# Patient Record
Sex: Male | Born: 1960
Health system: Southern US, Community
[De-identification: ages and names within clinical notes are randomized; demographics above are authoritative.]

## PROBLEM LIST (undated history)

## (undated) ENCOUNTER — Emergency Department (HOSPITAL_COMMUNITY): Admission: EM | Discharge status: Absent without leave | Payer: Medicaid Other

## (undated) DIAGNOSIS — K7460 Unspecified cirrhosis of liver: Secondary | ICD-10-CM

## (undated) DIAGNOSIS — R519 Headache, unspecified: Secondary | ICD-10-CM

## (undated) DIAGNOSIS — M12811 Other specific arthropathies, not elsewhere classified, right shoulder: Secondary | ICD-10-CM

## (undated) DIAGNOSIS — I10 Essential (primary) hypertension: Secondary | ICD-10-CM

## (undated) DIAGNOSIS — E119 Type 2 diabetes mellitus without complications: Secondary | ICD-10-CM

## (undated) DIAGNOSIS — R51 Headache: Secondary | ICD-10-CM

## (undated) DIAGNOSIS — D649 Anemia, unspecified: Secondary | ICD-10-CM

## (undated) HISTORY — DX: Essential (primary) hypertension: I10

## (undated) HISTORY — PX: SHOULDER ARTHROSCOPY W/ ROTATOR CUFF REPAIR: SHX2400

## (undated) HISTORY — DX: Anemia, unspecified: D64.9

## (undated) HISTORY — DX: Headache: R51

## (undated) HISTORY — DX: Other specific arthropathies, not elsewhere classified, right shoulder: M12.811

## (undated) HISTORY — DX: Headache, unspecified: R51.9

## (undated) HISTORY — DX: Unspecified cirrhosis of liver: K74.60

## (undated) HISTORY — DX: Type 2 diabetes mellitus without complications: E11.9

---

## 2003-11-01 ENCOUNTER — Emergency Department (HOSPITAL_COMMUNITY): Admission: AD | Admit: 2003-11-01 | Discharge: 2003-11-01 | Payer: Self-pay | Admitting: Family Medicine

## 2004-02-29 ENCOUNTER — Emergency Department (HOSPITAL_COMMUNITY): Admission: EM | Admit: 2004-02-29 | Discharge: 2004-02-29 | Payer: Self-pay | Admitting: *Deleted

## 2005-07-22 ENCOUNTER — Emergency Department (HOSPITAL_COMMUNITY): Admission: EM | Admit: 2005-07-22 | Discharge: 2005-07-22 | Payer: Self-pay | Admitting: Family Medicine

## 2007-07-24 ENCOUNTER — Emergency Department (HOSPITAL_COMMUNITY): Admission: EM | Admit: 2007-07-24 | Discharge: 2007-07-24 | Payer: Self-pay | Admitting: Emergency Medicine

## 2008-01-05 ENCOUNTER — Emergency Department (HOSPITAL_COMMUNITY): Admission: EM | Admit: 2008-01-05 | Discharge: 2008-01-05 | Payer: Self-pay | Admitting: Emergency Medicine

## 2008-01-25 ENCOUNTER — Emergency Department (HOSPITAL_COMMUNITY): Admission: EM | Admit: 2008-01-25 | Discharge: 2008-01-25 | Payer: Self-pay | Admitting: Emergency Medicine

## 2011-04-10 LAB — POCT URINALYSIS DIP (DEVICE)
Nitrite: NEGATIVE
Protein, ur: 100 — AB
Specific Gravity, Urine: 1.02
Urobilinogen, UA: 1

## 2011-04-18 LAB — POCT URINALYSIS DIP (DEVICE)
Bilirubin Urine: NEGATIVE
Ketones, ur: NEGATIVE
Ketones, ur: NEGATIVE
Nitrite: NEGATIVE
Protein, ur: 30 — AB
Specific Gravity, Urine: 1.015
Urobilinogen, UA: 1
Urobilinogen, UA: 0.2
pH: 6

## 2011-04-18 LAB — CBC
Platelets: 210
RBC: 6.04 — ABNORMAL HIGH
RDW: 14.8

## 2011-04-18 LAB — DIFFERENTIAL
Basophils Relative: 1
Eosinophils Relative: 7 — ABNORMAL HIGH
Lymphocytes Relative: 32
Monocytes Absolute: 0.4
Neutro Abs: 3.6
Neutrophils Relative %: 54

## 2011-04-18 LAB — POCT I-STAT, CHEM 8
Calcium, Ion: 1.24
HCT: 48
Hemoglobin: 16.3
TCO2: 28

## 2011-04-18 LAB — URINE CULTURE

## 2011-11-04 ENCOUNTER — Encounter (HOSPITAL_COMMUNITY): Payer: Self-pay | Admitting: Emergency Medicine

## 2011-11-04 ENCOUNTER — Emergency Department (INDEPENDENT_AMBULATORY_CARE_PROVIDER_SITE_OTHER)
Admission: EM | Admit: 2011-11-04 | Discharge: 2011-11-04 | Disposition: A | Discharge status: Home / Self Care | Payer: PRIVATE HEALTH INSURANCE | Source: Home / Self Care

## 2011-11-04 DIAGNOSIS — J069 Acute upper respiratory infection, unspecified: Secondary | ICD-10-CM

## 2011-11-04 MED ORDER — LORATADINE 10 MG PO TABS: 10.0000 mg | ORAL_TABLET | Freq: Every day | ORAL | Status: DC

## 2011-11-04 MED ORDER — BENZONATATE 100 MG PO CAPS: ORAL_CAPSULE | ORAL | Status: AC

## 2011-11-04 NOTE — Discharge Instructions (Signed)
Increase fluids and rest. Tylenol or Ibuprofen as needed for discomfort. Take medications as prescribed for nasal congestion and cough.  Return if symptoms change or worsen.   Upper Respiratory Infection, Adult An upper respiratory infection (URI) is also known as the common cold. It is often caused by a type of germ (virus). Colds are easily spread (contagious). You can pass it to others by kissing, coughing, sneezing, or drinking out of the same glass. Usually, you get better in 1 or 2 weeks.  HOME CARE   Only take medicine as told by your doctor.   Use a warm mist humidifier or breathe in steam from a hot shower.   Drink enough water and fluids to keep your pee (urine) clear or pale yellow.   Get plenty of rest.   Return to work when your temperature is back to normal or as told by your doctor. You may use a face mask and wash your hands to stop your cold from spreading.  GET HELP RIGHT AWAY IF:   After the first few days, you feel you are getting worse.   You have questions about your medicine.   You have chills, shortness of breath, or brown or red spit (mucus).   You have yellow or brown snot (nasal discharge) or pain in the face, especially when you bend forward.   You have a fever, puffy (swollen) neck, pain when you swallow, or white spots in the back of your throat.   You have a bad headache, ear pain, sinus pain, or chest pain.   You have a high-pitched whistling sound when you breathe in and out (wheezing).   You have a lasting cough or cough up blood.   You have sore muscles or a stiff neck.  MAKE SURE YOU:   Understand these instructions.   Will watch your condition.   Will get help right away if you are not doing well or get worse.  Document Released: 12/25/2007 Document Revised: 06/27/2011 Document Reviewed: 11/12/2010 [facility] Patient Information 2012 [place], [place].Upper Respiratory Infection, Adult An upper respiratory infection (URI) is also known as  the common cold. It is often caused by a type of germ (virus). Colds are easily spread (contagious). You can pass it to others by kissing, coughing, sneezing, or drinking out of the same glass. Usually, you get better in 1 or 2 weeks.  HOME CARE   Only take medicine as told by your doctor.   Use a warm mist humidifier or breathe in steam from a hot shower.   Drink enough water and fluids to keep your pee (urine) clear or pale yellow.   Get plenty of rest.   Return to work when your temperature is back to normal or as told by your doctor. You may use a face mask and wash your hands to stop your cold from spreading.  GET HELP RIGHT AWAY IF:   After the first few days, you feel you are getting worse.   You have questions about your medicine.   You have chills, shortness of breath, or brown or red spit (mucus).   You have yellow or brown snot (nasal discharge) or pain in the face, especially when you bend forward.   You have a fever, puffy (swollen) neck, pain when you swallow, or white spots in the back of your throat.   You have a bad headache, ear pain, sinus pain, or chest pain.   You have a high-pitched whistling sound when you breathe in  and out (wheezing).   You have a lasting cough or cough up blood.   You have sore muscles or a stiff neck.  MAKE SURE YOU:   Understand these instructions.   Will watch your condition.   Will get help right away if you are not doing well or get worse.  Document Released: December 12, 202009 Document Revised: 06/27/2011 Document Reviewed: 11/12/2010 [facility] Patient Information 2012 [place], [place].

## 2011-11-04 NOTE — ED Notes (Signed)
HERE WITH COLD SX THAT STARTED Saturday.COUGH WITH WHITE PHLEGM,CHILLS AND FEVER.TAKING TYLENOL.PT SPEAKS [place] LANGUAGE WITH LITTLE ENGLISH.ABLE TO EXPRESS NEEDS

## 2011-11-04 NOTE — ED Provider Notes (Signed)
History     CSN: [id number]  Arrival date & time 11/04/11  0848   None     Chief Complaint  Patient presents with  . URI    (Consider location/radiation/quality/duration/timing/severity/associated sxs/prior treatment) HPI Comments: Pt presents with complaints of nasal congestion and cough for one week. No dyspnea or wheezing.  He states he has had intermittent chills and HA, relieved with Tylenol. Nasal congestion and sputum is white in color. He denies hemoptysis. Pt denies any travel or sick contacts. He has been in the [place] for approximately 9 yrs.    History reviewed. No pertinent past medical history.  History reviewed. No pertinent past surgical history.  History reviewed. No pertinent family history.  History  Substance Use Topics  . Smoking status: Never Smoker   . Smokeless tobacco: Not on file  . Alcohol Use: No      Review of Systems  Constitutional: Negative for fever, chills and fatigue.  HENT: Positive for congestion and rhinorrhea. Negative for ear pain, sore throat, sneezing, postnasal drip and sinus pressure.   Respiratory: Positive for cough. Negative for shortness of breath and wheezing.   Cardiovascular: Negative for chest pain.    Allergies  Review of patient's allergies indicates no known allergies.  Home Medications   Current Outpatient Rx  Name Route Sig Dispense Refill  . BENZONATATE 100 MG PO CAPS  1-2 caps every 8 hrs prn cough 30 capsule 0  . LORATADINE 10 MG PO TABS Oral Take 1 tablet (10 mg total) by mouth daily. 10 tablet 0    BP 128/78  Pulse 62  Temp(Src) 98 F (36.7 C) (Oral)  Resp 14  SpO2 99%  Physical Exam  Nursing note and vitals reviewed. Constitutional: He appears well-developed and well-nourished. No distress.  HENT:  Head: Normocephalic and atraumatic.  Right Ear: Tympanic membrane, external ear and ear canal normal.  Left Ear: Tympanic membrane, external ear and ear canal normal.  Nose: Nose normal.    Mouth/Throat: Uvula is midline, oropharynx is clear and moist and mucous membranes are normal. No oropharyngeal exudate, posterior oropharyngeal edema or posterior oropharyngeal erythema.  Neck: Neck supple.  Cardiovascular: Normal rate, regular rhythm and normal heart sounds.   Pulmonary/Chest: Effort normal and breath sounds normal. No respiratory distress.  Lymphadenopathy:    He has no cervical adenopathy.  Neurological: He is alert.  Skin: Skin is warm and dry.  Psychiatric: He has a normal mood and affect.    ED Course  Procedures (including critical care time)  Labs Reviewed - No data to display No results found.   1. Acute URI       MDM  URI symptoms for one week. No documented fever, though reports intermittent chills. VSS, exam neg.         Melody Comas, [place] 11/04/11 1031

## 2011-11-07 NOTE — ED Provider Notes (Signed)
Medical screening examination/treatment/procedure(s) were performed by non-physician practitioner and as supervising physician I was immediately available for consultation/collaboration.  Alen Bleacher, MD 11/07/11 2125

## 2011-11-21 ENCOUNTER — Encounter (HOSPITAL_COMMUNITY): Payer: Self-pay | Admitting: Emergency Medicine

## 2011-11-21 ENCOUNTER — Emergency Department (HOSPITAL_COMMUNITY)
Admission: EM | Admit: 2011-11-21 | Discharge: 2011-11-21 | Disposition: A | Discharge status: Home / Self Care | Payer: PRIVATE HEALTH INSURANCE | Source: Home / Self Care | Attending: Emergency Medicine | Admitting: Emergency Medicine

## 2011-11-21 DIAGNOSIS — H8190 Unspecified disorder of vestibular function, unspecified ear: Secondary | ICD-10-CM

## 2011-11-21 LAB — POCT I-STAT, CHEM 8
BUN: 10 mg/dL (ref 6–23)
Calcium, Ion: 1.27 mmol/L (ref 1.12–1.32)
Chloride: 106 mEq/L (ref 96–112)
Hemoglobin: 16.3 g/dL (ref 13.0–17.0)
Potassium: 4.5 mEq/L (ref 3.5–5.1)
TCO2: 28 mmol/L (ref 0–100)

## 2011-11-21 MED ORDER — MECLIZINE HCL 12.5 MG PO TABS: 12.5000 mg | ORAL_TABLET | Freq: Three times a day (TID) | ORAL | Status: AC | PRN

## 2011-11-21 NOTE — ED Notes (Signed)
PT HERE WITH DAUGHTER THAT IS THE INTERPRETOR WITH [employer]/O [employer]UD[employer] V[employer]IG[employer] THAT STARTED THIS AM AFTER GETTING OOB FOR WORK.PT STATES THE ROOM WAS SPINNING,DIZZINESS AND BECAME NAUSEATED.SENSITIVE TO LIGHT.FRONTAL H/A NOTED.

## 2011-11-21 NOTE — ED Provider Notes (Signed)
History     CSN: [id number]  Arrival date & time 11/21/11  1478   First MD Initiated Contact with Patient 11/21/11 1040      Chief Complaint  Patient presents with  . Dizziness  . Migraine    (Consider location/radiation/quality/duration/timing/severity/associated sxs/prior treatment) HPI Comments: Patient described through any relative. He woke up this morning around 5 AM feeling very dizzy as he stood up felt like he was spinning he needed to sit down for a while and that seemed to have subsided a bit this sensation. He did say he became nauseous at one point. During interview I tried to establish clarity through his Niece if he had any type of headache, chest pains, palpitations, shortness of breath, tinnitus, ear pain or pressure or discomfort, or any other symptoms. Patient denies having felt any of his symptoms.  Patient is sitting in exam table and is asked if he is dizzy at this very moment. He replies no. 2 you have a headache ? No. Are you nauseous? No. Patient is then asked to move around and lay flat and he said that that seemed to trigger a little bit of spinning sensation but not as bad as this morning.  Patient is a 51 y.o. male presenting with migraine. The history is provided by the patient and a relative. No language interpreter was used.  Migraine This is a new problem. The current episode started 3 to 5 hours ago. The problem occurs constantly. The problem has not changed since onset.Pertinent negatives include no chest pain, no abdominal pain, no headaches and no shortness of breath. The symptoms are aggravated by bending and standing. The symptoms are relieved by rest.    History reviewed. No pertinent past medical history.  History reviewed. No pertinent past surgical history.  No family history on file.  History  Substance Use Topics  . Smoking status: Never Smoker   . Smokeless tobacco: Not on file  . Alcohol Use: No      Review of Systems    Constitutional: Negative for fever, chills, activity change and fatigue.  HENT: Negative for neck stiffness.   Respiratory: Negative for choking, chest tightness and shortness of breath.   Cardiovascular: Negative for chest pain.  Gastrointestinal: Negative for abdominal pain.  Skin: Negative for rash and wound.  Neurological: Positive for dizziness. Negative for tremors, seizures, syncope, facial asymmetry, speech difficulty, weakness, numbness and headaches.    Allergies  Review of patient's allergies indicates no known allergies.  Home Medications   Current Outpatient Rx  Name Route Sig Dispense Refill  . LORATADINE 10 MG PO TABS Oral Take 1 tablet (10 mg total) by mouth daily. 10 tablet 0  . MECLIZINE HCL 12.5 MG PO TABS Oral Take 1 tablet (12.5 mg total) by mouth 3 (three) times daily as needed for dizziness. 15 tablet 0    BP 117/71  Pulse 58  Temp(Src) 97.8 F (36.6 C) (Oral)  Resp 18  SpO2 98%  Physical Exam  Nursing note and vitals reviewed. Constitutional: He is oriented to person, place, and time. He appears well-developed and well-nourished.  HENT:  Head: Normocephalic.  Eyes: Conjunctivae and EOM are normal. Pupils are equal, round, and reactive to light. Right eye exhibits no discharge. Left eye exhibits no discharge.  Pulmonary/Chest: Effort normal and breath sounds normal. No respiratory distress. He has no decreased breath sounds. He has no wheezes. He has no rales. He exhibits no tenderness.  Abdominal: He exhibits no distension.  Musculoskeletal:  Normal range of motion.  Neurological: He is alert and oriented to person, place, and time. He displays no atrophy and normal reflexes. No cranial nerve deficit or sensory deficit. He exhibits normal muscle tone. He displays a negative Romberg sign. Coordination and gait normal.  Skin: No rash noted.    ED Course  Procedures (including critical care time)  Labs Reviewed  POCT I-STAT, CHEM 8 - Abnormal; Notable  for the following:    Glucose, Bld 121 (*)    All other components within normal limits   No results found.   1. Vertiginous syndrome       MDM  Patient with sudden onset of vertigo this morning was unable to go to work. Niece acted as Equities trader. Patient denies any headache, paresthesias or weakness. This episode has resolved for the most put describes that when he moves a little bit or stands up suddenly things spinning. Patient has a normal neurological exam was unable to reproduce his vertigo at this point. You had normal electrolytes. Haven't urged patient to be alert for specific symptoms weren't further evaluation members apartment. Meclizine prescription was given urged to rest for the next 48 hours and hydrate himself well. If symptoms were to persist or 48 hours of ice and to go to the emergency department for further evaluation.       Jimmie Molly, MD 11/21/11 1419

## 2011-11-21 NOTE — Discharge Instructions (Signed)
As discussed today's exam and current symptoms were consistent with a peripheral vertigo condition. We discuss in detail what other symptoms will warrant further evaluation in the emergency department. At this point avoid driving and rest for the next 48 hours take abundant fluids and take his prescribed medicine. If any new symptoms such as severe headache, numbness or tingling sensations of any of your upper lower extremities, any facial paralysis or changes or speech problems or worsening vertigo should go to the emergency department for further evaluation

## 2013-08-30 ENCOUNTER — Emergency Department (HOSPITAL_COMMUNITY)
Admission: EM | Admit: 2013-08-30 | Discharge: 2013-08-30 | Disposition: A | Discharge status: Home / Self Care | Payer: BC Managed Care – PPO | Source: Home / Self Care

## 2013-08-30 ENCOUNTER — Encounter (HOSPITAL_COMMUNITY): Payer: Self-pay | Admitting: Emergency Medicine

## 2013-08-30 DIAGNOSIS — S139XXA Sprain of joints and ligaments of unspecified parts of neck, initial encounter: Secondary | ICD-10-CM

## 2013-08-30 DIAGNOSIS — J069 Acute upper respiratory infection, unspecified: Secondary | ICD-10-CM

## 2013-08-30 DIAGNOSIS — S161XXA Strain of muscle, fascia and tendon at neck level, initial encounter: Secondary | ICD-10-CM

## 2013-08-30 MED ORDER — ONDANSETRON HCL 4 MG PO TABS: 4.0000 mg | ORAL_TABLET | Freq: Four times a day (QID) | ORAL | Status: DC

## 2013-08-30 MED ORDER — GUAIFENESIN-DM 100-10 MG/5ML PO SYRP: 5.0000 mL | ORAL_SOLUTION | ORAL | Status: DC | PRN

## 2013-08-30 MED ORDER — PHENYLEPH-DOXYL-DM-ASPIRIN 7.8-6.25-10-500 MG PO TBEF: EFFERVESCENT_TABLET | ORAL | Status: DC

## 2013-08-30 NOTE — ED Notes (Addendum)
C/o cough, headache and neck burning ( hot) onset 2 weeks ago.  C/o chills and fever.  C/o aching in neck and chest.

## 2013-08-30 NOTE — ED Notes (Signed)
Pt. said he has other older children that can translate the d/c instructions for him.   No questions.

## 2013-08-30 NOTE — ED Provider Notes (Signed)
CSN: [id number]     Arrival date & time 08/30/13  1538 History   First MD Initiated Contact with Patient 08/30/13 1705     No chief complaint on file.    (Consider location/radiation/quality/duration/timing/severity/associated sxs/prior Treatment) HPI Comments: 53 year old yet and needs male speaks a language The NAD presents with complaints of headache, cough, PND, stuffy nose, nasal congestion, sniffles and soreness in the neck returns his head. He also has abdominal wall pain upon coughing only.   No past medical history on file. No past surgical history on file. No family history on file. History  Substance Use Topics  . Smoking status: Never Smoker   . Smokeless tobacco: Not on file  . Alcohol Use: No    Review of Systems  Constitutional: Negative.   HENT:       As per history of present illness  Respiratory: Positive for cough. Negative for shortness of breath and wheezing.   Cardiovascular: Negative.   Gastrointestinal: Positive for nausea. Negative for vomiting.  Genitourinary: Negative.   Musculoskeletal: Positive for neck pain.  Neurological: Negative.       Allergies  Review of patient's allergies indicates no known allergies.  Home Medications   Current Outpatient Rx  Name  Route  Sig  Dispense  Refill  . guaiFENesin-dextromethorphan (ROBITUSSIN DM) 100-10 MG/5ML syrup   Oral   Take 5 mLs by mouth every 4 (four) hours as needed for cough.   118 mL   0   . ondansetron (ZOFRAN) 4 MG tablet   Oral   Take 1 tablet (4 mg total) by mouth every 6 (six) hours.   12 tablet   0   . Phenyleph-Doxyl-DM-Aspirin (ALKA-SELTZER PLUS NIGHT COLD) 7.8-6.25-10-500 MG TBEF      5 ml po q 4-6 h prn cough and cold symptoms   24 tablet   0    BP 123/88  Pulse 83  Temp(Src) 98.6 F (37 C) (Oral)  Resp 16  SpO2 100% Physical Exam  Nursing note and vitals reviewed. Constitutional: He is oriented to person, place, and time. He appears well-developed and  well-nourished. No distress.  HENT:  Mouth/Throat: No oropharyngeal exudate.  Bilateral TMs are normal Oropharynx with Minor erythema and clear PND  Eyes: Conjunctivae and EOM are normal. Pupils are equal, round, and reactive to light.  Neck: Normal range of motion. Neck supple.  Cardiovascular: Normal rate, regular rhythm and normal heart sounds.   Pulmonary/Chest: Effort normal and breath sounds normal. No respiratory distress. He has no wheezes. He has no rales.  Abdominal: Soft. He exhibits no distension.  Musculoskeletal: Normal range of motion. He exhibits no edema.  Lymphadenopathy:    He has no cervical adenopathy.  Neurological: He is alert and oriented to person, place, and time.  Skin: Skin is warm and dry. No rash noted.  Psychiatric: He has a normal mood and affect.    ED Course  Procedures (including critical care time) Labs Review Labs Reviewed - No data to display Imaging Review No results found.    MDM   Final diagnoses:  URI (upper respiratory infection)  Strain of neck muscle    Heat to the area of muscle that is sore around the neck. Take the Alka-Seltzer cold nighttime medicine as directed Robitussin-DM as needed for cough Zofran 4 mg every 6 hours when necessary nausea Plan fluids stay well-hydrated Call the above number to obtain a primary care physician.+    Janne Napoleon, NP 08/30/13 1725

## 2013-09-01 NOTE — ED Provider Notes (Signed)
Medical screening examination/treatment/procedure(s) were performed by resident physician or non-physician practitioner and as supervising physician I was immediately available for consultation/collaboration.   Pauline Good MD.   Billy Fischer, MD 09/01/13 [phone]

## 2016-04-10 ENCOUNTER — Ambulatory Visit (HOSPITAL_COMMUNITY)
Admission: EM | Admit: 2016-04-10 | Discharge: 2016-04-10 | Disposition: A | Discharge status: Home / Self Care | Payer: No Typology Code available for payment source | Attending: Family Medicine | Admitting: Family Medicine

## 2016-04-10 ENCOUNTER — Encounter (HOSPITAL_COMMUNITY): Payer: Self-pay | Admitting: Emergency Medicine

## 2016-04-10 DIAGNOSIS — G44219 Episodic tension-type headache, not intractable: Secondary | ICD-10-CM

## 2016-04-10 MED ORDER — KETOROLAC TROMETHAMINE 60 MG/2ML IM SOLN
60.0000 mg | Freq: Once | INTRAMUSCULAR | Status: AC
  Administered 2016-04-10: 60 mg via INTRAMUSCULAR

## 2016-04-10 MED ORDER — KETOROLAC TROMETHAMINE 10 MG PO TABS: 10.0000 mg | ORAL_TABLET | Freq: Four times a day (QID) | ORAL | 0 refills | Status: AC | PRN

## 2016-04-10 MED ORDER — KETOROLAC TROMETHAMINE 60 MG/2ML IM SOLN
INTRAMUSCULAR | Status: AC
  Filled 2016-04-10: qty 2

## 2016-04-10 NOTE — ED Triage Notes (Signed)
[place] interpreter [phone]  Pt reports a headache for the last three weeks.  He states when he eats meat and drinks soda he gets a headache, then he takes his BP and it is high. He does not give a numerical value.  He denies CP, SOB, or back pain.    He reports that when he gets the headache it goes down his neck and it becomes stiff.  He takes Tylenol and the pain subsides some but then it comes back.

## 2016-04-10 NOTE — ED Provider Notes (Signed)
CSN: [id number]     Arrival date & time 04/10/16  1511 History   First MD Initiated Contact with Patient 04/10/16 1556     Chief Complaint  Patient presents with  . Headache   (Consider location/radiation/quality/duration/timing/severity/associated sxs/prior Treatment) The history is provided by the patient. The history is limited by a language barrier. A language interpreter was used.  Headache  Pain location:  Generalized Quality:  Unable to specify Radiates to:  L neck, R neck, L shoulder and R shoulder Severity currently:  7/10 Onset quality:  Gradual Duration:  3 weeks Timing:  Intermittent Progression:  Worsening Chronicity:  New Similar to prior headaches: yes   Relieved by:  Nothing (Tried tylenol and goody powder) Ineffective treatments:  Acetaminophen Associated symptoms: neck pain   Associated symptoms: no abdominal pain, no back pain, no blurred vision, no congestion, no cough, no diarrhea, no dizziness, no drainage, no ear pain, no eye pain, no facial pain, no fatigue, no fever, no focal weakness, no hearing loss, no loss of balance, no myalgias, no nausea, no near-syncope, no neck stiffness, no numbness, no paresthesias, no photophobia, no sinus pressure, no sore throat, no syncope and no visual change     History reviewed. No pertinent past medical history. History reviewed. No pertinent surgical history. History reviewed. No pertinent family history. Social History  Substance Use Topics  . Smoking status: Never Smoker  . Smokeless tobacco: Never Used  . Alcohol use No    Review of Systems  Constitutional: Negative for appetite change, chills, fatigue and fever.  HENT: Negative for congestion, ear pain, hearing loss, postnasal drip, sinus pressure and sore throat.   Eyes: Negative for blurred vision, photophobia and pain.  Respiratory: Negative for cough and shortness of breath.   Cardiovascular: Negative for chest pain, palpitations, leg swelling, syncope and  near-syncope.  Gastrointestinal: Negative for abdominal pain, diarrhea and nausea.  Musculoskeletal: Positive for neck pain. Negative for back pain, myalgias and neck stiffness.  Neurological: Positive for headaches. Negative for dizziness, focal weakness, numbness, paresthesias and loss of balance.    Allergies  Review of patient's allergies indicates no known allergies.  Home Medications   Prior to Admission medications   Medication Sig Start Date End Date Taking? Authorizing Provider  acetaminophen (TYLENOL) 325 MG tablet Take 650 mg by mouth every 6 (six) hours as needed for headache.   Yes Historical Provider, MD  guaiFENesin-dextromethorphan (ROBITUSSIN DM) 100-10 MG/5ML syrup Take 5 mLs by mouth every 4 (four) hours as needed for cough. 08/30/13   Janne Napoleon, NP  ketorolac (TORADOL) 10 MG tablet Take 1 tablet (10 mg total) by mouth every 6 (six) hours as needed. 04/10/16 04/13/16  Barry Dienes, NP  ondansetron (ZOFRAN) 4 MG tablet Take 1 tablet (4 mg total) by mouth every 6 (six) hours. 08/30/13   Janne Napoleon, NP  Phenyleph-Doxyl-DM-Aspirin (ALKA-SELTZER PLUS NIGHT COLD) 7.8-6.25-10-500 MG TBEF 5 ml po q 4-6 h prn cough and cold symptoms 08/30/13   Janne Napoleon, NP  Pseudoeph-Doxylamine-DM-APAP (NYQUIL PO) Take by mouth.    Historical Provider, MD   Meds Ordered and Administered this Visit   Medications  ketorolac (TORADOL) injection 60 mg (60 mg Intramuscular Given 04/10/16 1700)    BP 126/86 (BP Location: Right Leg)   Pulse 75   Temp 98.4 F (36.9 C) (Oral)   SpO2 96%  No data found.   Physical Exam  Constitutional: He is oriented to person, place, and time. He appears well-developed and well-nourished.  HENT:  Head: Normocephalic and atraumatic.  Right Ear: External ear normal.  Left Ear: External ear normal.  Nose: Nose normal.  Mouth/Throat: Oropharynx is clear and moist. No oropharyngeal exudate.  Eyes: Conjunctivae and EOM are normal. Pupils are equal, round, and reactive to  light. Right eye exhibits no discharge. Left eye exhibits no discharge.  Neck: Normal range of motion. Neck supple.  Sore to palpate at neck and shoulders areas.   Cardiovascular: Normal rate, regular rhythm and normal heart sounds.   Pulmonary/Chest: Effort normal and breath sounds normal.  Abdominal: Soft. Bowel sounds are normal. He exhibits no distension. There is no tenderness.  Musculoskeletal: Normal range of motion.  Lymphadenopathy:    He has no cervical adenopathy.  Neurological: He is alert and oriented to person, place, and time.  Cranial nerves intact. Has good coordination.    Skin: Skin is warm and dry.  Nursing note and vitals reviewed.   Urgent Care Course   Clinical Course    Procedures (including critical care time)  Labs Review Labs Reviewed - No data to display  Imaging Review No results found.   MDM   1. Episodic tension-type headache, not intractable    Physical examination unremarkable. Patient was afraid that his HA was due to high blood pressure, however his BP is at normal range in the office. He denies hx of HTN. He denies checking BP at home in the last 3 weeks when he has these headache episodes. Headache characteristic inconsistent with migraine or cluster, more consistent with tension-type. Patient treated with ketorolac 60mg  IM and headache has improved, headache is now 3/10. Patient would like a prescription to take home with him. Rx given for Ketorolac 10mg  Every 6 hours as needed. Discharge instruction given. ER Return precaution discussed.    Barry Dienes, NP 04/10/16 1729

## 2016-09-04 ENCOUNTER — Encounter: Payer: Self-pay | Admitting: Adult Health

## 2016-09-04 ENCOUNTER — Ambulatory Visit (INDEPENDENT_AMBULATORY_CARE_PROVIDER_SITE_OTHER): Payer: No Typology Code available for payment source | Admitting: Adult Health

## 2016-09-04 VITALS — Temp 98.6°F | Wt 140.2 lb

## 2016-09-04 DIAGNOSIS — Z01818 Encounter for other preprocedural examination: Secondary | ICD-10-CM | POA: Diagnosis not present

## 2016-09-04 DIAGNOSIS — Z23 Encounter for immunization: Secondary | ICD-10-CM | POA: Diagnosis not present

## 2016-09-04 DIAGNOSIS — I10 Essential (primary) hypertension: Secondary | ICD-10-CM

## 2016-09-04 DIAGNOSIS — Z7689 Persons encountering health services in other specified circumstances: Secondary | ICD-10-CM | POA: Diagnosis not present

## 2016-09-04 LAB — BASIC METABOLIC PANEL
BUN: 9 mg/dL (ref 6–23)
CALCIUM: 9.5 mg/dL (ref 8.4–10.5)
CO2: 32 mEq/L (ref 19–32)
CREATININE: 0.8 mg/dL (ref 0.40–1.50)
Chloride: 94 mEq/L — ABNORMAL LOW (ref 96–112)
GFR: 105.86 mL/min (ref >60.00)
Glucose, Bld: 360 mg/dL — ABNORMAL HIGH (ref 70–99)
Potassium: 3.7 mEq/L (ref 3.5–5.1)
SODIUM: 129 meq/L — AB (ref 135–145)

## 2016-09-04 LAB — CBC WITH DIFFERENTIAL/PLATELET
Basophils Absolute: 0.1 10*3/uL (ref 0.0–0.1)
Basophils Relative: 1 % (ref 0.0–3.0)
EOS PCT: 1.1 % (ref 0.0–5.0)
Eosinophils Absolute: 0.1 10*3/uL (ref 0.0–0.7)
HCT: 42.1 % (ref 39.0–52.0)
Hemoglobin: 13.4 g/dL (ref 13.0–17.0)
LYMPHS ABS: 1.9 10*3/uL (ref 0.7–4.0)
Lymphocytes Relative: 29.7 % (ref 12.0–46.0)
MCHC: 31.8 g/dL (ref 30.0–36.0)
MCV: 73 fl — AB (ref 78.0–100.0)
MONO ABS: 0.4 10*3/uL (ref 0.1–1.0)
MONOS PCT: 5.8 % (ref 3.0–12.0)
NEUTROS ABS: 4.1 10*3/uL (ref 1.4–7.7)
NEUTROS PCT: 62.4 % (ref 43.0–77.0)
PLATELETS: 163 10*3/uL (ref 150.0–400.0)
RBC: 5.77 Mil/uL (ref 4.22–5.81)
RDW: 14.4 % (ref 11.5–15.5)
WBC: 6.5 10*3/uL (ref 4.0–10.5)

## 2016-09-04 NOTE — Addendum Note (Signed)
Addended by: Wynn Banker H on: 09/04/2016 11:14 AM   Modules accepted: Orders

## 2016-09-04 NOTE — Patient Instructions (Addendum)
It was great meeting you today   After your surgery, I would like you to follow up with me for your full physical exam.   Please bring all your prescription bottles with you to the next appointment.

## 2016-09-04 NOTE — Addendum Note (Signed)
Addended by: Sandria Bales B on: 09/04/2016 02:30 PM   Modules accepted: Orders

## 2016-09-04 NOTE — Progress Notes (Signed)
Patient presents to clinic today to establish care. He is a pleasant 56 year old viet[place] male who  has a past medical history of Essential hypertension and Frequent headaches. His daughter is with him to help provide translation  BP Readings from Last 3 Encounters:  04/10/16 126/86  08/30/13 123/88  11/21/11 117/71    Acute Concerns: Establish Care   Surgery Clearance  - he is having right shoulder scope and needs to have pre surgical clearance   Chronic Issues: Migraines - He uses OTC medications when he has a headache and this works well for him. He gets migraines once or twice per month   Essential Hypertension- He takes medication for this but does not know what it is called. He does not have his prescription bottles with him.   Health Maintenance: Dental -- Does not do routine care  Vision -- Does not do routine care  Immunizations -- Needs to  Colonoscopy -- Never had and does not know if he wants one.    Past Medical History:  Diagnosis Date  . Essential hypertension   . Frequent headaches     No past surgical history on file.  Current Outpatient Prescriptions on File Prior to Visit  Medication Sig Dispense Refill  . [DISCONTINUED] loratadine (CLARITIN) 10 MG tablet Take 1 tablet (10 mg total) by mouth daily. 10 tablet 0   No current facility-administered medications on file prior to visit.     No Known Allergies  Family History  Problem Relation Age of Onset  . Family history unknown: Yes    Social History   Social History  . Marital status: Married    Spouse name: N/A  . Number of children: N/A  . Years of education: N/A   Occupational History  . Not on file.   Social History Main Topics  . Smoking status: Never Smoker  . Smokeless tobacco: Never Used  . Alcohol use No  . Drug use: No  . Sexual activity: Yes    Birth control/ protection: None   Other Topics Concern  . Not on file   Social History Narrative   Works for [employer]        Review of Systems  Constitutional: Negative.   HENT: Negative.   Eyes: Negative.   Respiratory: Negative.   Cardiovascular: Negative.   Gastrointestinal: Negative.   Musculoskeletal: Negative.   Skin: Negative.   Neurological: Negative.   Endo/Heme/Allergies: Negative.   Psychiatric/Behavioral: Negative.   All other systems reviewed and are negative.   Temp 98.6 F (37 C) (Oral)   Wt 140 lb 3.2 oz (63.6 kg)   Physical Exam  Constitutional: He is oriented to person, place, and time and well-developed, well-nourished, and in no distress. No distress.  HENT:  Head: Normocephalic and atraumatic.  Right Ear: External ear normal.  Left Ear: External ear normal.  Nose: Nose normal.  Mouth/Throat: Oropharynx is clear and moist. Abnormal dentition. No oropharyngeal exudate.  Eyes: Conjunctivae and EOM are normal. Pupils are equal, round, and reactive to light. Right eye exhibits no discharge. Left eye exhibits no discharge. No scleral icterus.  Neck: Normal range of motion. Neck supple. No JVD present. No tracheal deviation present. No thyromegaly present.  Cardiovascular: Normal rate, regular rhythm, normal heart sounds and intact distal pulses.  Exam reveals no gallop and no friction rub.   No murmur heard. Pulmonary/Chest: Effort normal and breath sounds normal. No stridor. No respiratory distress. He has no wheezes. He  has no rales. He exhibits no tenderness.  Abdominal: Soft. Bowel sounds are normal. He exhibits no distension and no mass. There is no tenderness. There is no rebound and no guarding.  Musculoskeletal: Normal range of motion. He exhibits no edema, tenderness or deformity.  Lymphadenopathy:    He has no cervical adenopathy.  Neurological: He is alert and oriented to person, place, and time. He displays normal reflexes. No cranial nerve deficit. He exhibits normal muscle tone. Coordination normal. GCS score is 15.  Skin: Skin is warm and dry. No rash  noted. He is not diaphoretic. No erythema. No pallor.  Psychiatric: Mood, memory, affect and judgment normal.  Nursing note and vitals reviewed.   Assessment/Plan: 1. Encounter to establish care - Follow up for CPE  - Follow up sooner if needed - Tdap given   2. Preop examination - EKG 12-Lead-  SR - Rate 68. Showed low voltage in limb leads. Anterolateral ST elevation. This appears to be from lead placement. Reviewed with Dr. Sherren Mocha and he agrees.  - CBC with Differential/Platelet - Basic metabolic panel; Future  3. Essential hypertension - Spoke with his local pharmacy and they advised that his only maintenance medication was for HCTZ 25 mg and his last refill was about 9 months ago for 30 days. His blood pressure is normal without this medication. I will not refill at this time.  - CBC with Differential/Platelet - Basic metabolic panel; Future  Dorothyann Peng, NP

## 2016-09-05 LAB — HEMOGLOBIN A1C: HEMOGLOBIN A1C: 11.9 % — AB (ref 4.6–6.5)

## 2016-09-10 ENCOUNTER — Ambulatory Visit: Payer: No Typology Code available for payment source | Admitting: Adult Health

## 2016-09-11 ENCOUNTER — Encounter: Payer: Self-pay | Admitting: Adult Health

## 2016-09-11 ENCOUNTER — Ambulatory Visit (INDEPENDENT_AMBULATORY_CARE_PROVIDER_SITE_OTHER): Payer: No Typology Code available for payment source | Admitting: Adult Health

## 2016-09-11 VITALS — BP 132/74 | Temp 98.3°F | Wt 142.2 lb

## 2016-09-11 DIAGNOSIS — E1165 Type 2 diabetes mellitus with hyperglycemia: Secondary | ICD-10-CM

## 2016-09-11 DIAGNOSIS — E119 Type 2 diabetes mellitus without complications: Secondary | ICD-10-CM | POA: Insufficient documentation

## 2016-09-11 DIAGNOSIS — IMO0001 Reserved for inherently not codable concepts without codable children: Secondary | ICD-10-CM

## 2016-09-11 MED ORDER — GLUCOSE BLOOD VI STRP: ORAL_STRIP | 12 refills | Status: AC

## 2016-09-11 MED ORDER — LISINOPRIL 2.5 MG PO TABS
2.5000 mg | ORAL_TABLET | Freq: Every day | ORAL | 1 refills | Status: DC
Start: 2016-09-11 — End: 2017-06-10

## 2016-09-11 MED ORDER — METFORMIN HCL 500 MG PO TABS
500.0000 mg | ORAL_TABLET | Freq: Two times a day (BID) | ORAL | 3 refills | Status: DC
Start: 2016-09-11 — End: 2016-10-15

## 2016-09-11 MED ORDER — ONETOUCH LANCETS MISC: 3 refills | Status: AC

## 2016-09-11 NOTE — Progress Notes (Signed)
Subjective:    Patient ID: Michael Hamilton, male    DOB: 1960-12-07, 56 y.o.   MRN: [id number]  HPI  56 year old [name] who  has a past medical history of Diabetes mellitus ([place]); Essential hypertension; and Frequent headaches. He presents to the office today with his daughter and [place] interpreter for follow-up on recent labs. I last saw him a week ago it was for presurgical clearance for probable torn rotator cuff on right side. Basic labs were given and a blood sugar of greater than 300 was noted, subsequently an A1c was added on,which came back as being 11.9.  Patient was made aware through [place] interpreter of his diagnosis of diabetes mellitus. He denies any family history or ever being diagnosed with diabetes in the past.  He denies any fatigue, polyuria, polydipsia, or feeling ill.   Review of Systems  Constitutional: Negative.   Respiratory: Negative.   Cardiovascular: Negative.   Gastrointestinal: Negative.   Genitourinary: Negative.   Musculoskeletal: Positive for arthralgias.  Skin: Negative.   Neurological: Negative.   All other systems reviewed and are negative.  Past Medical History:  Diagnosis Date  . Diabetes mellitus ([place])   . Essential hypertension   . Frequent headaches     Social History   Social History  . Marital status: Married    Spouse name: N/A  . Number of children: N/A  . Years of education: N/A   Occupational History  . Not on file.   Social History Main Topics  . Smoking status: Never Smoker  . Smokeless tobacco: Never Used  . Alcohol use No  . Drug use: No  . Sexual activity: Yes    Birth control/ protection: None   Other Topics Concern  . Not on file   Social History Narrative   Works for [employer]        No past surgical history on file.  Family History  Problem Relation Age of Onset  . Family history unknown: Yes    No Known Allergies  Current Outpatient Prescriptions on File Prior to Visit    Medication Sig Dispense Refill  . HYDROcodone-acetaminophen (NORCO/VICODIN) 5-325 MG tablet Take 1 tablet by mouth every 6 (six) hours as needed for moderate pain.    Marland Kitchen ibuprofen (ADVIL,MOTRIN) 600 MG tablet Take 600 mg by mouth every 6 (six) hours as needed.    Marland Kitchen ketorolac (TORADOL) 10 MG tablet Take 10 mg by mouth every 6 (six) hours as needed.    . methocarbamol (ROBAXIN) 500 MG tablet Take 500 mg by mouth every 6 (six) hours as needed for muscle spasms.    . traMADol (ULTRAM) 50 MG tablet Take 50 mg by mouth every 6 (six) hours as needed.    . [DISCONTINUED] loratadine (CLARITIN) 10 MG tablet Take 1 tablet (10 mg total) by mouth daily. 10 tablet 0   No current facility-administered medications on file prior to visit.     BP 132/74   Temp 98.3 F (36.8 C) (Oral)   Wt 142 lb 3.2 oz (64.5 kg)       Objective:   Physical Exam  Constitutional: He is oriented to person, place, and time. He appears well-developed and well-nourished. No distress.  Eyes: Conjunctivae and EOM are normal. Pupils are equal, round, and reactive to light. Right eye exhibits no discharge. Left eye exhibits no discharge. No scleral icterus.  Neck: Normal range of motion. Neck supple. No thyromegaly present.  Cardiovascular: Normal rate, regular rhythm, normal heart sounds  and intact distal pulses.  Exam reveals no gallop and no friction rub.   No murmur heard. Pulmonary/Chest: Effort normal and breath sounds normal. No respiratory distress. He has no wheezes. He has no rales. He exhibits no tenderness.  Musculoskeletal: Normal range of motion. He exhibits no edema, tenderness or deformity.  Lymphadenopathy:    He has no cervical adenopathy.  Neurological: He is alert and oriented to person, place, and time.  Skin: Skin is warm and dry. No rash noted. He is not diaphoretic. No erythema. No pallor.  Psychiatric: He has a normal mood and affect. His behavior is normal. Judgment and thought content normal.  Nursing  note and vitals reviewed.      Assessment & Plan:  1. Uncontrolled type 2 diabetes mellitus without complication, without long-term current use of insulin (HCC) - We spoke at length about his newly diagnosed diabetes, including blood glucose control, medications, diet, and exercise. All questions were answered - Also was given a glucometer and he and his daughter were shown how to properly use this machine. They were able to demonstrate back to this writer how to use glucometer. He was advised to check his blood sugars 3 times a day and bring log to either the next appointment with me or with endocrinology. - Due to being a newly diagnosed I feel that starting this issue on insulin right now would not be the best thing to do as I believe there might be some discord with proper adherence to insulin regimens. I will start him off on metformin 500 mg twice a day and titrate up -  Ambulatory referral to Endocrinology - lisinopril (ZESTRIL) 2.5 MG tablet; Take 1 tablet (2.5 mg total) by mouth daily.  Dispense: 90 tablet; Refill: 1 - metFORMIN (GLUCOPHAGE) 500 MG tablet; Take 1 tablet (500 mg total) by mouth 2 (two) times daily with a meal.  Dispense: 180 tablet; Refill: 3 - glucose blood (ONETOUCH VERIO) test strip; Test blood sugars as directed. Dx: E11.9  Dispense: 100 each; Refill: 12 - ONE TOUCH LANCETS MISC; Test blood sugars as directed. Dx: E11.9  Dispense: 100 each; Refill: 3 - Follow-up in one month or with endocrinology which ever comes first  Greater than 50% of 45 minutes was spent face-to-face with patient, counseling and/or coordinating care and due to proper diabetes management

## 2016-09-11 NOTE — Patient Instructions (Addendum)
Theo di ???ng huy?t, Ng??i l?n (Blood Glucose Monitoring, Adult) Theo di ???ng huy?t (cn ???c g?i l l??ng ???ng trong mu) s? gip qu v? qu?n l b?nh ti?u ???ng c?a mnh. N c?ng gip qu v? v chuyn gia ch?m [place] s?c kh?e c?a qu v? theo di b?nh ti?u ???ng v xc ??nh k? ho?ch ?i?u tr? c?a qu v? hi?u qu? ??n ?u. T?I SAO QU V? NN THEO DI ???NG HUY?T C?A MNH?   Vi?c theo di c th? gip qu v? hi?u r th?c ?n, t?p th? d?c v thu?c men ?nh h??ng nh? th? no ??n l??ng ???ng huy?t c?a qu v?.  N cho php qu v? bi?t l??ng ???ng huy?t t?i m?t th?i ?i?m nh?t ??nh. Qu v? c th? nhanh chng bi?t ???c n?u qu v? ?ang c ???ng huy?t th?p (h? ???ng huy?t) ho?c l??ng ???ng huy?t cao (t?ng ???ng huy?t).  N c th? gip qu v? v chuyn gia ch?m [place] s?c kh?e bi?t ?i?u ch?nh thu?c cho qu v? nh? th? no.  N c th? gip qu v? hi?u lm th? no ?? qu?n l b?nh ho?c ?i?u ch?nh thu?c ?? t?p luy?n. KHI NO QU V? C?N KI?M TRA? Chuyn gia ch?m [place] s?c kh?e s? gip qu v? quy?t ??nh qu v? c?n ki?m tra ???ng huy?t bao lu m?t l?n. ?i?u ny c th? ph? thu?c vo lo?i b?nh ti?u ???ng c?a qu v?, bi?n php ki?m sot b?nh ti?u ???ng, ho?c cc lo?i thu?c qu v? ?ang dng. Hy ch?c ch?n vi?t ra t?t c? cc k?t qu? ?o ???ng huy?t ?? thng tin ny c th? ???c ?nh gi cng v?i chuyn gia ch?m  s?c kh?e c?a qu v?. Xem cc v d? d??i ?y v? th?i gian ki?m tra m [place] gia ch?m  s?c kh?e c th? ?? ngh?. Ti?u ???ng tup 1   Ki?m tra l??ng ???ng trong mu c?a quy? vi? t nh?t 2 l?n m?i ngy.  C?ng ki?m tra l??ng ???ng trong mu c?a quy? vi?:  Tr??c m?i l?n tim insulin.  Tr??c v sau khi t?p th? d?c.  Gi?a cc b?a ?n.  2 gi? sau b?a ?n.  Thi?nh thoa?ng, t? 2 gi?? sng ??n 3 gi?? sng.  Quy? vi? c th? c?n ki?m tra l??ng ???ng trong mu cu?a quy? vi? th??ng xuyn h?n:  N?u quy? vi? s? d?ng m?t my tim insulin.  N?u quy? vi? c?n tim nhi?u l?n m?i ngy.  N?u b?nh ti?u ???ng c?a quy? vi? khng  ???c ki?m sot t?t.  N?u quy? vi? ?ang b? b?nh. Ti?u ???ng tup 2   N?u qu v? ?ang dng insulin, ki?m tra t nh?t 2 l?n m?i ngy. Tuy nhin, t?t nh?t l ph?i ki?m tra tr??c m?i l?n tim insulin.  N?u qu v? dng thu?c qua ???ng mi?ng (???ng u?ng), ki?m tra 2 l?n m?i ngy.  N?u qu v? ?ang c m?t ch? ?? ?n u?ng ki?m sot, ki?m tra m?t l?n m?i ngy.  N?u b?nh ti?u ???ng c?a qu v? khng ???c ki?m sot t?t ho?c n?u qu v? b? ?m, qu v? c th? c?n ph?i theo di th??ng xuyn h?n. LM TH? NO ?? THEO DI ???NG HUY?T C?A QU V? V?t d?ng c?n thi?t   My ?o ???ng huy?t.  Que th? ?? ?o ???ng huy?t c?a qu v?. M?i my ?o c cc que th? ring. Qu v? ph?i s? d?ng cc que th? ?i km my ?o c?a qu v?.  M?t m?i kim chm (m?i trch).  M?t d?ng c? gi? m?i trch (d?ng c? trch).  M?t cu?n nh?t k ghi l?i k?t qu? c?a qu v?. Th? thu?t   R?a tay qu v? b?ng x phng v n??c. C?n khng ???c ?u tin.  Chm vo c?nh c?a ngn tay (khng ph?i ??u ngn tay) b?ng m?i trch.  N?n nh? nhng ngn tay cho t?i khi m?t gi?t mu nh? xu?t hi?n.  Lm theo cc h??ng d?n km theo my ?o c?a qu v? ?? chn que th?, bi mu vo que th?, v dng my ?o ???ng huy?t c?a qu v?. Nh?ng khu v?c khc c th? l?y mu ?? xt nghi?m  M?t s? my ?o cho php qu v? s? d?ng cc khu v?c khc c?a c? th? (ngoi ngn tay c?a qu v?) ?? xt nghi?m mu. Nh?ng khu v?c ny ???c g?i l v? tr thay th?. Nh?ng v? tr thay th? ph? bi?n nh?t l:  C?ng tay.  ?i.  Khu v?c pha sau c?a c?ng chn pha d??i.  Lng bn tay. Dng mu ??n nh?ng khu v?c ny ch?m h?n. V v?y, cc k?t qu? ?o ???ng huy?t m qu v? c ???c c th? b? ch?m tr?, v cc con s? k?t qu? l khc v?i khi l?y mu t? ngn tay c?a qu v?. Khng s? d?ng cc v? tr thay th? n?u qu v? ngh? r?ng qu v? ?ang b? h? ???ng huy?t. K?t qu? c?a qu v? s? khng chnh xc. Lun lun s? d?ng m?t ngn tay n?u qu v? ?ang b? h? ???ng huy?t. Ngoi ra, n?u qu v? khng th? c?m th?y l??ng ???ng  huy?t th?p (khng bi?t h? ???ng huy?t), hy lun lun s? d?ng ngn tay ?? ki?m tra ???ng huy?t c?a qu v?. L?I KHUYN B? SUNG ?? THEO DI ???NG HUY?T  Khng s? d?ng l?i m?i trch.  Lun lun mang theo cc v?t d?ng c?a qu v?.  T?t c? my ?o ???ng huy?t c m?t s? ?i?n tho?i "???ng dy nng" tr?c 24 gi? ?? g?i n?u qu v? c cu h?i ho?c c?n tr? gip.  ?i?u ch?nh (hi?u ch?nh) my ?o ???ng huy?t c?a qu v? b?ng m?t dung d?ch ki?m sot sau khi dng h?t m?t vi h?p que th?. GI? S? THEO DI ???NG HUY?T Qu v? nn gi? s? ghi chp hng ngy ho?c nh?t k k?t qu? ???ng huy?t c?a qu v?. H?u h?t my ?o ???ng huy?t, n?u khng ph?i t?t c?, ??u l?u gi? k?t qu? ???ng huy?t trong my. M?t s? my c kh? n?ng t?i k?t qu? l?u tr? c?a qu v? v? my tnh ? nh qu v?. Vi?c gi? m?t s? theo di k?t qu? ???ng huy?t l ??c bi?t h?u ch n?u qu v? mu?n xem m?u hnh c?a cc k?t qu?. Ghi l?i k?t qu? ???ng huy?t v qu v? c th? qun nh?ng g ? x?y ra t?i ?ng th?i ?i?m ?. B?o qu?n t?t s? ghi chp s? gip qu v? v chuyn gia ch?m [place] s?c kh?e lm vi?c cng nhau ?? qu?n l t?t b?nh ti?u ???ng.  Thng tin ny khng nh?m m?c ?ch thay th? cho l?i khuyn m chuyn gia ch?m [place] s?c kh?e ni v?i qu v?. Hy b?o ??m qu v? ph?i th?o lu?n b?t k? v?n ?? g m qu v? c v?i chuyn gia ch?m [place] s?c kh?e c?a qu v?. Document Released: 08/04/2015 Elsevier Interactive Patient Education  2017 [employer].      Ti?u ???ng tup 2, Ch?n ?oa?n,  Ng??i l?n (Type 2 Diabetes Mellitus, Diagnosis, Adult) Ti?u ????ng tup 2 (b?nh ti?u ????ng tup 2) la? m?t b?nh ke?o da?i (ma?n ti?nh). ?? b?nh ti?u ????ng tup 2, m?t ho??c ca? hai v?n ?? sau co? th? xa?y ra:  Tuy?n tu?y khng ta?o ra ?u? m?t lo?i hc mn c tn la? insulin.  Ca?c t? ba?o trong c? th? khng ?a?p ??ng thch h??p v??i insulin ma? c? th? quy? vi? ta?o ra (kha?ng insulin). Thng th???ng, insulin cho phe?p ????ng trong mu (glucose) ?i va?o va?o t? ba?o trong  c? th?. Nh??ng t? ba?o na?y s?? du?ng glucose ?? ta?o ra n?ng l???ng. Ti?nh tra?ng kha?ng insulin ho??c thi?u h?t insulin lm cho ???ng d? th?a tch t? trong mu thay v ?i vo trong cc t? bo. H?u qu? l l??ng ???ng trong mu cao (t?ng ???ng huy?t). ?I?U GI? LA?M T?NG NGUY C?? Nh?ng y?u t? sau c th? lm qu v? d? b? ti?u ???ng tup 2 h?n:  C m?t ng??i trong gia ?nh b? ti?u ???ng tup 2.  Th?a cn ho?c bo ph.  Co? l?i s?ng khng v?n ??ng (ti?nh ta?i).  ? ???c ch?n ?on khng insulin.  Co? ti?n s?? bi? ti?n ti?u ????ng, ti?u ????ng lc mang thai, ho??c h?i ch??ng bu?ng ch??ng ?a nang (PCOS).  La? ng???i co? ngu?n g?c My?-?n, My?-Phi, Hispanic/Latino, ho??c chu A?/?a?o Tha?i bi?nh d??ng. NH?NG D?U HI?U HO??C TRI?U CH?NG L G? Trong giai ?oa?n s??m cu?a b?nh na?y, qu v? co? th? khng co? tri?u ch??ng. Cc tri?u ch?ng pht tri?n ch?m v c th? bao g?m:  Kht n??c nhi?u (ch?ng kht nhi?u).  ?o?i t?ng ln(ch??ng ?n nhi?u).  Ti?u ti?n nhi?u (?a ni?u).  ?i ti?u nhi?u vo ban ?m (ti?u ?m).  S?t cn khng r nguyn nhn.  Nhi?m tru?ng th???ng xuyn ma? th???ng bi? ti la?i (ta?i pha?t).  M?t m?i.  Y?u.  Thay ??i th? l?c, ch?ng h?n nh? nhn m?.  V?t c?t ho?c v?t b?m tm lu lnh.  ?au bu?t ho?c t ? bn tay ho?c bn chn.  Cc m?ng ?en trn da (ch?ng gai ?en). B?NH NA?Y ????C CH?N ?OA?N TH? NA?O? B?nh ny ???c ch?n ?on d?a vo cc tri?u ch?ng, b?nh s?? cu?a quy? vi?, kha?m th??c th? v l???ng ????ng huy?t cu?a quy? vi?. ???ng huy?t c th? ???c ki?m tra b?ng m?t ho?c nhi?u xt nghi?m mu sau ?y:  Xt nghi?m ???ng huy?t lc ?i (fasting blood glucose, FBG). Qu v? s? khng ???c php ?n (quy? vi? se? nhi?n ?o?i) trong t nh?t l 8 ti?ng tr??c khi l?y m?u mu.  Xt nghi?m ???ng huy?t ng?u nhin. Xe?t nghi?m na?y ki?m tra ???ng huy?t c?a qu v? b?t k? lc no trong ngy, b?t k? qu v? ?n lc no.  M?t xe?t nghi?m ma?u A1c (hemoglobin A1c).  Xt nghi?m na?y cung c?p thng tin v? vi?c ki?m sot ???ng huy?t trong 2-3 thng tr??c ?.  M?t xt nghi?m dung n?p glucose theo ???ng u?ng (OGTT). Xe?t nghi?m na?y ?o l???ng ????ng huy?t cu?a quy? vi? ta?i hai th??i ?i?m:  Sau khi nh?n ?i. ?y l l??ng ???ng huy?t c? s? c?a qu v?.  Hai gi?? sau khi u?ng m?t ?? u?ng co? ch??a glucose. Quy? vi? co? th? ????c ch?n ?oa?n bi? ti?u ????ng tup 2 n?u:  L???ng FBG cu?a quy? vi? t? 126 mg/dL (7,0 mmol/L) tr? ln.  L???ng ????ng huy?t ng?u nhin t? 200 mg/dL (11,1 mmol/L) tr? ln.  L???ng A1c cu?a quy? vi? t? 6,5%  tr? ln.  K?t qu? OGGT c?a qu v? trn 200 mg/dL (11,1 mmol/L). Nh??ng xe?t nghi?m ma?u na?y co? th? ????c l??p la?i ?? xa?c ??nh ch?n ?oa?n cu?a quy? vi?. B?NH [place] ????C ?I?U TRI? NH? TH? NA?O? Vi?c ?i?u tri? cu?a quy? vi? co? th? do m?t chuyn gia go?i la? ba?c si? chuyn khoa n?i ti?t qua?n ly?Marland Kitchen B?nh ti?u ????ng tup 2 co? th? ????c ?i?u tr? b??ng ca?ch tun th? theo ch? d?n c?a chuyn gia ch?m [place] s?c kh?e v?:  Thay ??i ch? ?? ?n va? l?i s?ng. Vi?c ny c th? bao g?m:  Tun theo m?t k? hoa?ch dinh d???ng ring do m?t chuyn gia v? ch? ?? ?n va? dinh d???ng (chuyn gia dinh d???ng ????c ??ng ky? ha?nh ngh?) xy d??ng.  T?p th? d?c th??ng xuyn.  Tm cc cch qu?n l c?ng th?ng.  Ki?m tra l??ng ????ng huy?t cu?a quy? vi? th???ng xuyn theo chi? d?n.  Du?ng thu?c ?i?u tri? ti?u ????ng ho??c insulin hng nga?y. Vi?c na?y giu?p gi?? l???ng ????ng huy?t cu?a quy? vi? n??m trong pha?m vi an toa?n.  N?u quy? vi? du?ng insulin, quy? vi? c th? c?n ?i?u chi?nh li?u ty thu?c m??c ?? hoa?t ??ng th? ch?t cu?a quy? vi? va? loa?i th??c ?n ma? quy? vi? ?n. Chuyn gia ch?m [place] s?c kh?e s? ni cho qu v? cch ?i?u ch?nh li?u l??ng.  Du?ng thu?c ?? giu?p tra?nh cc bi?n ch??ng cu?a ti?u ????ng, ch??ng ha?n nh?:  Aspirin.  Thu?c gia?m cholesterol.  Thu?c ki?m sot huy?t  p. Chuyn gia ch?m so?c s??c kho?e cu?a quy? vi? co? th? ???t ca?c mu?c tiu ?i?u tri? ring cho quy? vi?. Mu?c tiu ?i?u tri? se? d??a va?o tu?i cu?a quy? vi?, ca?c ti?nh tra?ng b?nh ly? cu?a quy? vi? va? vi?c quy? vi? ?a?p ??ng nh? th? na?o v??i vi?c ?i?u tri? ti?u ????ng. Nhn chung, mu?c tiu ?i?u tri? la? duy tri? l???ng glucose trong ma?u nh? sau:  Tr???c b??a ?n (tr???c khi ?n): 80-130 mg/dL (4,4-7,2 mmol/L).  Sau b??a ?n (sau khi ?n): d???i 180 mg/dL (10 mmol/L).  L???ng A1c: th?p h?n 7%. TUN THU? NH??NG H???NG D?N NA?Y ?? NHA?: Ca?c cu h?i ??t ra v?i chuyn gia ch?m [place] s?c kh?e c?a qu v?  Cn nh??c ho?i nh??ng cu sau:  Ti co? c?n g??p m?t chuyn gia h???ng d?n v? b?nh ti?u ????ng khng?  Ti co? th? ti?m m?t nho?m h? tr?? ng???i bi? ti?u ????ng ?? ?u?  Ti se? c?n thi?t bi? gi? ?? qua?n ly? b?nh ti?u ????ng ?? nha??  Ti c?n loa?i thu?c no ?i?u tri? ti?u ????ng va? khi na?o ti c?n du?ng cc thu?c ??  Ti c?n ki?m tra ????ng huy?t bao lu m?t l?n?  Ti co? th? go?i cho s? na?o n?u ti co? th?c m?c?  Cu?c he?n ti?p theo cu?a ti la? khi na?o? H??ng d?n chung   Ch? s? d?ng thu?c khng c?n k ??n v thu?c c?n k ??n theo ch? d?n c?a chuyn gia ch?m [place] s?c kh?e.  Tun th? t?t c? cc cu?c h?n khm l?i theo ch? d?n c?a chuyn gia ch?m [place] s?c kh?e. ?i?u ny c vai tr quan tr?ng.  ?? bi?t thm thng tin v? b?nh ti?u ????ng, ha?y truy c?p:  Hi?p h?i Ti?u ????ng My? (ADA): [email or web address].diabetes.org  Hi?p h?i Chuyn gia H???ng d?n v? Ti?u ????ng cu?a My? (AADE): [email or web address] HA?Y LIN LA?C V??I CHUYN GIA CH?M SO?C S??C KHO?E N?U:  L??ng ???ng huy?t c?a qu v?  t? 240 mg/dL (13,3 mmol/L) tr? ln trong 2 nga?y lin tu?c.  Qu v? ? b? ?m ho?c b? s?t trong i?t nh?t 2 ngy v khng ??.  Quy? vi? co? b?t ky? v?n ?? na?o sau ?y trong h?n 6 gi??:  Qu v? khng th? ?n ho?c u?ng.  Qu v?  b? bu?n nn v nn m?a.  Qu v? b? tiu ch?y. YU C?U TR? GIP NGAY L?P T?C N?U:  L???ng ????ng huy?t cu?a quy? vi? d???i 54 mg/dL (3,0 mmol/L).  Quy? vi? b? lu? l?n ho??c quy? vi? kho? suy nghi? ro? ra?ng.  Qu v? b? kh th?.  Qu v? c l??ng ketone trong n???c ti?u ? m?c trung bnh ho??c cao. Thng tin ny khng nh?m m?c ?ch thay th? cho l?i khuyn m chuyn gia ch?m [place] s?c kh?e ni v?i qu v?. Hy b?o ??m qu v? ph?i th?o lu?n b?t k? v?n ?? g m qu v? c v?i chuyn gia ch?m [place] s?c kh?e c?a qu v?. Document Released: 07/08/2005 Document Revised: 10/30/2015 Document Reviewed: 08/11/2015 Elsevier Interactive Patient Education  2017 [employer].

## 2016-09-12 ENCOUNTER — Encounter: Payer: Self-pay | Admitting: Adult Health

## 2016-09-18 ENCOUNTER — Encounter: Payer: Self-pay | Admitting: Endocrinology

## 2016-10-15 ENCOUNTER — Ambulatory Visit (INDEPENDENT_AMBULATORY_CARE_PROVIDER_SITE_OTHER): Payer: No Typology Code available for payment source | Admitting: Endocrinology

## 2016-10-15 ENCOUNTER — Encounter: Payer: No Typology Code available for payment source | Attending: Endocrinology | Admitting: Nutrition

## 2016-10-15 ENCOUNTER — Encounter: Payer: Self-pay | Admitting: Endocrinology

## 2016-10-15 VITALS — BP 128/76 | HR 72 | Wt 145.0 lb

## 2016-10-15 DIAGNOSIS — Z713 Dietary counseling and surveillance: Secondary | ICD-10-CM | POA: Diagnosis not present

## 2016-10-15 DIAGNOSIS — E1165 Type 2 diabetes mellitus with hyperglycemia: Secondary | ICD-10-CM

## 2016-10-15 DIAGNOSIS — E119 Type 2 diabetes mellitus without complications: Secondary | ICD-10-CM

## 2016-10-15 DIAGNOSIS — IMO0001 Reserved for inherently not codable concepts without codable children: Secondary | ICD-10-CM

## 2016-10-15 MED ORDER — INSULIN LISPRO 100 UNIT/ML (KWIKPEN): 3.0000 [IU] | PEN_INJECTOR | Freq: Three times a day (TID) | SUBCUTANEOUS | 11 refills | Status: DC

## 2016-10-15 NOTE — Progress Notes (Signed)
Subjective:    Patient ID: Michael Hamilton, male    DOB: Jun 26, 1961, 56 y.o.   MRN: [id number]  HPI pt is referred by Dorothyann Peng, NP, for diabetes.  Pt states DM was dx'ed in late 2017, and he was prescribed 1 medication; he has mild if any neuropathy of the lower extremities; he is unaware of any associated chronic complications; he has never been on insulin; pt says his diet and exercise are good; he has never had pancreatitis, pancreatic surgery, severe hypoglycemia or DKA.   He was rx'ed metformin.  He brings a record of his cbg's which I have reviewed today.  It varies from the 100's-300's.   Past Medical History:  Diagnosis Date  . Diabetes mellitus ([place])   . Essential hypertension   . Frequent headaches     No past surgical history on file.  Social History   Social History  . Marital status: Married    Spouse name: N/A  . Number of children: N/A  . Years of education: N/A   Occupational History  . Not on file.   Social History Main Topics  . Smoking status: Never Smoker  . Smokeless tobacco: Never Used  . Alcohol use No  . Drug use: No  . Sexual activity: Yes    Birth control/ protection: None   Other Topics Concern  . Not on file   Social History Narrative   Works for [employer]        Current Outpatient Prescriptions on File Prior to Visit  Medication Sig Dispense Refill  . lisinopril (ZESTRIL) 2.5 MG tablet Take 1 tablet (2.5 mg total) by mouth daily. 90 tablet 1  . glucose blood (ONETOUCH VERIO) test strip Test blood sugars as directed. Dx: E11.9 (Patient not taking: Reported on 10/15/2016) 100 each 12  . HYDROcodone-acetaminophen (NORCO/VICODIN) 5-325 MG tablet Take 1 tablet by mouth every 6 (six) hours as needed for moderate pain.    Marland Kitchen ibuprofen (ADVIL,MOTRIN) 600 MG tablet Take 600 mg by mouth every 6 (six) hours as needed.    Marland Kitchen ketorolac (TORADOL) 10 MG tablet Take 10 mg by mouth every 6 (six) hours as needed.    . methocarbamol (ROBAXIN) 500 MG  tablet Take 500 mg by mouth every 6 (six) hours as needed for muscle spasms.    . ONE TOUCH LANCETS MISC Test blood sugars as directed. Dx: E11.9 (Patient not taking: Reported on 10/15/2016) 100 each 3  . traMADol (ULTRAM) 50 MG tablet Take 50 mg by mouth every 6 (six) hours as needed.    . [DISCONTINUED] loratadine (CLARITIN) 10 MG tablet Take 1 tablet (10 mg total) by mouth daily. 10 tablet 0   No current facility-administered medications on file prior to visit.     No Known Allergies  Family History  Problem Relation Age of Onset  . Diabetes Neg Hx     BP 128/76   Pulse 72   Wt 145 lb (65.8 kg)   SpO2 97%    Review of Systems denies blurry vision, headache, chest pain, sob, n/v, excessive diaphoresis, depression, cold intolerance, rhinorrhea, and easy bruising.  She has right shoulder pain since injury in 2017.  He has lost 25 lbs.  He has frequent urination.       Objective:   Physical Exam VS: see vs page GEN: no distress HEAD: head: no deformity eyes: no periorbital swelling, no proptosis external nose and ears are normal mouth: no lesion seen NECK: supple, thyroid is not  enlarged CHEST WALL: no deformity LUNGS: clear to auscultation CV: reg rate and rhythm, no murmur ABD: abdomen is soft, nontender.  no hepatosplenomegaly.  not distended.  no hernia MUSCULOSKELETAL: muscle bulk and strength are grossly normal.  no obvious joint swelling.  gait is normal and steady EXTEMITIES: no deformity.  no ulcer on the feet.  feet are of normal color and temp.  no edema PULSES: dorsalis pedis intact bilat.  no carotid bruit NEURO:  cn 2-12 grossly intact.   readily moves all 4's.  sensation is intact to touch on the feet.  SKIN:  Normal texture and temperature.  No rash or suspicious lesion is visible.   NODES:  None palpable at the neck PSYCH: alert, well-oriented.  Does not appear anxious nor depressed.    I personally reviewed electrocardiogram tracing  (09/04/16): Indication: preop Impression: NSR.  No hypertrophy.  Anterolateral ST-elevation vs repolarization variant.   No prior is available for comparison  Lab Results  Component Value Date   HGBA1C 11.9 (H) 09/05/2016   I have reviewed outside records, and summarized: Pt was noted to have severely elevated a1c, and referred here.  He had been seen for preop clearance for probable torn right rotator cuff, and severe hyperglycemia was noted.      Assessment & Plan:  Type 2 DM: severe exacerbation.   Weight loss, new to me, prob due to severe hyperglycemia.  We'll follow.    Patient is advised the following: Patient Instructions  good diet and exercise significantly improve the control of your diabetes.  please let me know if you wish to be referred to a dietician.  high blood sugar is very risky to your health.  you should see an eye doctor and dentist every year.  It is very important to get all recommended vaccinations.  Controlling your blood pressure and cholesterol drastically reduces the damage diabetes does to your body.  Those who smoke should quit.  Please discuss these with your doctor.  check your blood sugar twice a day.  vary the time of day when you check, between before the 3 meals, and at bedtime.  also check if you have symptoms of your blood sugar being too high or too low.  please keep a record of the readings and bring it to your next appointment here (or you can bring the meter itself).  You can write it on any piece of paper.  please call us sooner if your blood sugar goes below 70, or if you have a lot of readings over 200.  I have sent a prescription to your pharmacy, to start 3 units 3 times a day (just before each meal).  Please come back for a follow-up appointment in 1 month.

## 2016-10-15 NOTE — Patient Instructions (Addendum)
good diet and exercise significantly improve the control of your diabetes.  please let me know if you wish to be referred to a dietician.  high blood sugar is very risky to your health.  you should see an eye doctor and dentist every year.  It is very important to get all recommended vaccinations.  Controlling your blood pressure and cholesterol drastically reduces the damage diabetes does to your body.  Those who smoke should quit.  Please discuss these with your doctor.  check your blood sugar twice a day.  vary the time of day when you check, between before the 3 meals, and at bedtime.  also check if you have symptoms of your blood sugar being too high or too low.  please keep a record of the readings and bring it to your next appointment here (or you can bring the meter itself).  You can write it on any piece of paper.  please call us sooner if your blood sugar goes below 70, or if you have a lot of readings over 200.  I have sent a prescription to your pharmacy, to start 3 units 3 times a day (just before each meal).  Please come back for a follow-up appointment in 1 month.

## 2016-10-22 NOTE — Patient Instructions (Signed)
Take insulin daily as directed by Dr. Loanne Drilling Test blood sugars daily as directed by Dr. Loanne Drilling Call if questions, or if blood sugars drop below 70.

## 2016-10-22 NOTE — Progress Notes (Signed)
Michael Hamilton and his daughter and interpreter were instructed on the use of the insulin pen.  We also discussed the way the insulins work and the need to take this. Not sure at this time of the kind/dose of insulin he will be put on, so it was explained that Dr. Loanne Drilling will tell them that.  We discussed how/where to inject, how to store the insulin and how the insulin will work to lower his blood sugar. We also discussed low blood sugars--symptoms and treatments.  They reported good understanding of this. We also discussed the need to test blood sugars, to determine if the insulin dose is working for him.  He reports no difficulty in testing and reported that he will test as directed by Dr. Loanne Drilling. They had no final questions for me at this time.  They were given my number if questions develop

## 2016-11-15 ENCOUNTER — Encounter: Payer: Self-pay | Admitting: Endocrinology

## 2016-11-15 ENCOUNTER — Ambulatory Visit (INDEPENDENT_AMBULATORY_CARE_PROVIDER_SITE_OTHER): Payer: No Typology Code available for payment source | Admitting: Endocrinology

## 2016-11-15 ENCOUNTER — Telehealth: Payer: Self-pay | Admitting: Endocrinology

## 2016-11-15 ENCOUNTER — Ambulatory Visit: Payer: No Typology Code available for payment source | Admitting: Endocrinology

## 2016-11-15 VITALS — BP 132/92 | HR 68 | Ht 65.5 in | Wt 146.4 lb

## 2016-11-15 DIAGNOSIS — E119 Type 2 diabetes mellitus without complications: Secondary | ICD-10-CM | POA: Diagnosis not present

## 2016-11-15 LAB — POCT GLYCOSYLATED HEMOGLOBIN (HGB A1C): Hemoglobin A1C: 6.6

## 2016-11-15 MED ORDER — INSULIN LISPRO 100 UNIT/ML (KWIKPEN): 3.0000 [IU] | PEN_INJECTOR | Freq: Three times a day (TID) | SUBCUTANEOUS | 11 refills | Status: DC

## 2016-11-15 NOTE — Telephone Encounter (Signed)
Patient need refill of insulin lispro (HUMALOG KWIKPEN) 100 UNIT/ML KiwkPen  [facility] [place] [place]

## 2016-11-15 NOTE — Progress Notes (Signed)
Subjective:    Patient ID: Michael Hamilton, male    DOB: 1961/03/04, 56 y.o.   MRN: [id number]  HPI  Pt returns for f/u of diabetes mellitus: DM type: Insulin-requiring type 2 (but he may be developing type 1) Dx'ed: 7017 Complications: none Therapy: insulin since 2018 DKA: never Severe hypoglycemia: never Pancreatitis: never Other: he takes multiple daily injections Interval history: He brings a record of his cbg's which I have reviewed today.  It varies from 80-120.  There is no trend throughout the day. Past Medical History:  Diagnosis Date  . Diabetes mellitus ([place])   . Essential hypertension   . Frequent headaches     No past surgical history on file.  Social History   Social History  . Marital status: Married    Spouse name: N/A  . Number of children: N/A  . Years of education: N/A   Occupational History  . Not on file.   Social History Main Topics  . Smoking status: Never Smoker  . Smokeless tobacco: Never Used  . Alcohol use No  . Drug use: No  . Sexual activity: Yes    Birth control/ protection: None   Other Topics Concern  . Not on file   Social History Narrative   Works for [employer]        Current Outpatient Prescriptions on File Prior to Visit  Medication Sig Dispense Refill  . glucose blood (ONETOUCH VERIO) test strip Test blood sugars as directed. Dx: E11.9 100 each 12  . HYDROcodone-acetaminophen (NORCO/VICODIN) 5-325 MG tablet Take 1 tablet by mouth every 6 (six) hours as needed for moderate pain.    Marland Kitchen ibuprofen (ADVIL,MOTRIN) 600 MG tablet Take 600 mg by mouth every 6 (six) hours as needed.    Marland Kitchen ketorolac (TORADOL) 10 MG tablet Take 10 mg by mouth every 6 (six) hours as needed.    Marland Kitchen lisinopril (ZESTRIL) 2.5 MG tablet Take 1 tablet (2.5 mg total) by mouth daily. 90 tablet 1  . methocarbamol (ROBAXIN) 500 MG tablet Take 500 mg by mouth every 6 (six) hours as needed for muscle spasms.    . ONE TOUCH LANCETS MISC Test blood sugars as  directed. Dx: E11.9 100 each 3  . traMADol (ULTRAM) 50 MG tablet Take 50 mg by mouth every 6 (six) hours as needed.    . [DISCONTINUED] loratadine (CLARITIN) 10 MG tablet Take 1 tablet (10 mg total) by mouth daily. 10 tablet 0   No current facility-administered medications on file prior to visit.     No Known Allergies  Family History  Problem Relation Age of Onset  . Diabetes Neg Hx     BP (!) 132/92 (BP Location: Left Arm, Cuff Size: Normal)   Pulse 68   Ht 5' 5.5" (1.664 m)   Wt 146 lb 6.4 oz (66.4 kg)   SpO2 97%   BMI 23.99 kg/m   Review of Systems He denies hypoglycemia.      Objective:   Physical Exam VITAL SIGNS:  See vs page GENERAL: no distress Pulses: dorsalis pedis intact bilat.   MSK: no deformity of the feet CV: no leg edema Skin:  no ulcer on the feet.  normal color and temp on the feet. Neuro: sensation is intact to touch on the feet.    a1c=6.6%     Assessment & Plan:  He needs shoulder surgery: his surgical risk is low and outweighed by the potential benefit of the surgery.  he is therefore medically  cleared.   Insulin-requiring type 2 DM: well-controlled.    Patient Instructions  check your blood sugar twice a day.  vary the time of day when you check, between before the 3 meals, and at bedtime.  also check if you have symptoms of your blood sugar being too high or too low.  please keep a record of the readings and bring it to your next appointment here (or you can bring the meter itself).  You can write it on any piece of paper.  please call us sooner if your blood sugar goes below 70, or if you have a lot of readings over 200.  Please continue the same insulin. Please come back for a follow-up appointment in 2 months.

## 2016-11-15 NOTE — Telephone Encounter (Signed)
Refill submitted. 

## 2016-11-15 NOTE — Patient Instructions (Addendum)
check your blood sugar twice a day.  vary the time of day when you check, between before the 3 meals, and at bedtime.  also check if you have symptoms of your blood sugar being too high or too low.  please keep a record of the readings and bring it to your next appointment here (or you can bring the meter itself).  You can write it on any piece of paper.  please call us sooner if your blood sugar goes below 70, or if you have a lot of readings over 200.   Please continue the same insulin.   Please come back for a follow-up appointment in 2 months.     

## 2016-11-19 NOTE — Progress Notes (Signed)
Spoke with Michael Hamilton at [facility] and notified her, that patient has been cleared for surgery per endocrinology - new clearance form is being faxed. Will await form.

## 2016-11-19 NOTE — Progress Notes (Signed)
Please call [facility] - surgery scheduling and inform them that this patient has been cleared fir surgery by Endocrinology due to diabetes. If they need me to fill out a new form that is fine.

## 2017-01-15 ENCOUNTER — Encounter: Payer: Self-pay | Admitting: Endocrinology

## 2017-01-15 ENCOUNTER — Ambulatory Visit (INDEPENDENT_AMBULATORY_CARE_PROVIDER_SITE_OTHER): Payer: No Typology Code available for payment source | Admitting: Endocrinology

## 2017-01-15 VITALS — BP 122/82 | HR 60 | Ht 65.0 in | Wt 144.0 lb

## 2017-01-15 DIAGNOSIS — E119 Type 2 diabetes mellitus without complications: Secondary | ICD-10-CM

## 2017-01-15 NOTE — Patient Instructions (Addendum)
check your blood sugar twice a day.  vary the time of day when you check, between before the 3 meals, and at bedtime.  also check if you have symptoms of your blood sugar being too high or too low.  please keep a record of the readings and bring it to your next appointment here (or you can bring the meter itself).  You can write it on any piece of paper.  please call us sooner if your blood sugar goes below 70, or if you have a lot of readings over 200. Please continue the same insulin.   Please come back for a follow-up appointment in 4 months.    

## 2017-01-15 NOTE — Progress Notes (Signed)
Subjective:    Patient ID: Michael Hamilton, male    DOB: 1960-11-30, 56 y.o.   MRN: [id number]  HPI Pt returns for f/u of diabetes mellitus: DM type: Insulin-requiring type 2 (but he may be developing type 1) Dx'ed: 4818 Complications: none Therapy: insulin since 2018.  DKA: never.  Severe hypoglycemia: never Pancreatitis: never.  Pancreatic imaging: normal on 2009 CT Other: he takes multiple daily injections.   Interval history: no cbg record, but states cbg's are well-controlled.  There is no trend throughout the day. pt states he feels well in general. Past Medical History:  Diagnosis Date  . Diabetes mellitus ([place])   . Essential hypertension   . Frequent headaches     No past surgical history on file.  Social History   Social History  . Marital status: Married    Spouse name: N/A  . Number of children: N/A  . Years of education: N/A   Occupational History  . Not on file.   Social History Main Topics  . Smoking status: Never Smoker  . Smokeless tobacco: Never Used  . Alcohol use No  . Drug use: No  . Sexual activity: Yes    Birth control/ protection: None   Other Topics Concern  . Not on file   Social History Narrative   Works for [employer]        Current Outpatient Prescriptions on File Prior to Visit  Medication Sig Dispense Refill  . glucose blood (ONETOUCH VERIO) test strip Test blood sugars as directed. Dx: E11.9 100 each 12  . HYDROcodone-acetaminophen (NORCO/VICODIN) 5-325 MG tablet Take 1 tablet by mouth every 6 (six) hours as needed for moderate pain.    Marland Kitchen ibuprofen (ADVIL,MOTRIN) 600 MG tablet Take 600 mg by mouth every 6 (six) hours as needed.    . insulin lispro (HUMALOG KWIKPEN) 100 UNIT/ML KiwkPen Inject 0.03 mLs (3 Units total) into the skin 3 (three) times daily with meals. And pen needles 3/day 15 mL 11  . ketorolac (TORADOL) 10 MG tablet Take 10 mg by mouth every 6 (six) hours as needed.    Marland Kitchen lisinopril (ZESTRIL) 2.5 MG tablet Take 1  tablet (2.5 mg total) by mouth daily. 90 tablet 1  . methocarbamol (ROBAXIN) 500 MG tablet Take 500 mg by mouth every 6 (six) hours as needed for muscle spasms.    . ONE TOUCH LANCETS MISC Test blood sugars as directed. Dx: E11.9 100 each 3  . traMADol (ULTRAM) 50 MG tablet Take 50 mg by mouth every 6 (six) hours as needed.    . [DISCONTINUED] loratadine (CLARITIN) 10 MG tablet Take 1 tablet (10 mg total) by mouth daily. 10 tablet 0   No current facility-administered medications on file prior to visit.     No Known Allergies  Family History  Problem Relation Age of Onset  . Diabetes Neg Hx     BP 122/82   Pulse 60   Ht 5\' 5"  (1.651 m)   Wt 144 lb (65.3 kg)   SpO2 99%   BMI 23.96 kg/m    Review of Systems He denies hypoglycemia    Objective:   Physical Exam VITAL SIGNS:  See vs page GENERAL: no distress Pulses: dorsalis pedis intact bilat.   MSK: no deformity of the feet CV: no leg edema Skin:  no ulcer on the feet.  normal color and temp on the feet. Neuro: sensation is intact to touch on the feet.    Lab Results  Component  Value Date   HGBA1C 6.6 11/15/2016      Assessment & Plan:  Type 1 DM: well-controlled.  Lean body habitus: he is not a candidate for trial off insulin.   Patient Instructions  check your blood sugar twice a day.  vary the time of day when you check, between before the 3 meals, and at bedtime.  also check if you have symptoms of your blood sugar being too high or too low.  please keep a record of the readings and bring it to your next appointment here (or you can bring the meter itself).  You can write it on any piece of paper.  please call us sooner if your blood sugar goes below 70, or if you have a lot of readings over 200.  Please continue the same insulin. Please come back for a follow-up appointment in 4 months.

## 2017-05-16 ENCOUNTER — Encounter: Payer: Self-pay | Admitting: Endocrinology

## 2017-05-16 ENCOUNTER — Ambulatory Visit (INDEPENDENT_AMBULATORY_CARE_PROVIDER_SITE_OTHER): Payer: No Typology Code available for payment source | Admitting: Endocrinology

## 2017-05-16 VITALS — BP 128/80 | HR 84 | Wt 142.0 lb

## 2017-05-16 DIAGNOSIS — E119 Type 2 diabetes mellitus without complications: Secondary | ICD-10-CM

## 2017-05-16 LAB — POCT GLYCOSYLATED HEMOGLOBIN (HGB A1C): HEMOGLOBIN A1C: 9.9

## 2017-05-16 MED ORDER — INSULIN LISPRO PROT & LISPRO (75-25 MIX) 100 UNIT/ML KWIKPEN: 10.0000 [IU] | PEN_INJECTOR | Freq: Every day | SUBCUTANEOUS | 11 refills | Status: DC

## 2017-05-16 NOTE — Patient Instructions (Addendum)
check your blood sugar twice a day.  vary the time of day when you check, between before the 3 meals, and at bedtime.  also check if you have symptoms of your blood sugar being too high or too low.  please keep a record of the readings and bring it to your next appointment here (or you can bring the meter itself).  You can write it on any piece of paper.  please call us sooner if your blood sugar goes below 70, or if you have a lot of readings over 200.  Please change the insulin to a type you take once a day, with breakfast. On this type of insulin schedule, you should eat meals on a regular schedule.  If a meal is missed or significantly delayed, your blood sugar could go low.  You can use up your current insulin first. Please come back for a follow-up appointment in 3 months

## 2017-05-16 NOTE — Progress Notes (Signed)
Subjective:    Patient ID: Michael Hamilton, male    DOB: 1961/03/25, 56 y.o.   MRN: [id number]  HPI Pt returns for f/u of diabetes mellitus: DM type: Insulin-requiring type 2 (but he may be developing type 1) Dx'ed: 5638 Complications: none Therapy: insulin since 2018.  DKA: never.  Severe hypoglycemia: never Pancreatitis: never.  Pancreatic imaging: normal on 2009 CT. Other: he takes multiple daily injections; he works [employer], 1st shift.   Interval history: no cbg record, but states cbg's are well-controlled.  pt states he feels well in general.  Pt says he is having trouble remembering the TID insulin.   Past Medical History:  Diagnosis Date  . Diabetes mellitus ([place])   . Essential hypertension   . Frequent headaches     No past surgical history on file.  Social History   Social History  . Marital status: Married    Spouse name: N/A  . Number of children: N/A  . Years of education: N/A   Occupational History  . Not on file.   Social History Main Topics  . Smoking status: Never Smoker  . Smokeless tobacco: Never Used  . Alcohol use No  . Drug use: No  . Sexual activity: Yes    Birth control/ protection: None   Other Topics Concern  . Not on file   Social History Narrative   Works for [employer]        Current Outpatient Prescriptions on File Prior to Visit  Medication Sig Dispense Refill  . glucose blood (ONETOUCH VERIO) test strip Test blood sugars as directed. Dx: E11.9 100 each 12  . HYDROcodone-acetaminophen (NORCO/VICODIN) 5-325 MG tablet Take 1 tablet by mouth every 6 (six) hours as needed for moderate pain.    Marland Kitchen ibuprofen (ADVIL,MOTRIN) 600 MG tablet Take 600 mg by mouth every 6 (six) hours as needed.    Marland Kitchen ketorolac (TORADOL) 10 MG tablet Take 10 mg by mouth every 6 (six) hours as needed.    Marland Kitchen lisinopril (ZESTRIL) 2.5 MG tablet Take 1 tablet (2.5 mg total) by mouth daily. 90 tablet 1  . methocarbamol (ROBAXIN) 500 MG tablet Take 500 mg by mouth  every 6 (six) hours as needed for muscle spasms.    . ONE TOUCH LANCETS MISC Test blood sugars as directed. Dx: E11.9 100 each 3  . traMADol (ULTRAM) 50 MG tablet Take 50 mg by mouth every 6 (six) hours as needed.    . [DISCONTINUED] loratadine (CLARITIN) 10 MG tablet Take 1 tablet (10 mg total) by mouth daily. 10 tablet 0   No current facility-administered medications on file prior to visit.     No Known Allergies  Family History  Problem Relation Age of Onset  . Diabetes Neg Hx     BP 128/80   Pulse 84   Wt 142 lb (64.4 kg)   SpO2 97%   BMI 23.63 kg/m    Review of Systems He denies hypoglycemia.      Objective:   Physical Exam VITAL SIGNS:  See vs page GENERAL: no distress Pulses: foot pulses are intact bilaterally.   MSK: no deformity of the feet or ankles.  CV: no edema of the legs or ankles Skin:  no ulcer on the feet or ankles.  normal color and temp on the feet and ankles Neuro: sensation is intact to touch on the feet and ankles.    Lab Results  Component Value Date   HGBA1C 9.9 05/16/2017  Assessment & Plan:  Insulin-requiring type 2 DM: worse.  Noncompliance with cbg recording and insulin: he needs to change to qd insulin.   Patient Instructions  check your blood sugar twice a day.  vary the time of day when you check, between before the 3 meals, and at bedtime.  also check if you have symptoms of your blood sugar being too high or too low.  please keep a record of the readings and bring it to your next appointment here (or you can bring the meter itself).  You can write it on any piece of paper.  please call us sooner if your blood sugar goes below 70, or if you have a lot of readings over 200.  Please change the insulin to a type you take once a day, with breakfast. On this type of insulin schedule, you should eat meals on a regular schedule.  If a meal is missed or significantly delayed, your blood sugar could go low.  You can use up your current  insulin first. Please come back for a follow-up appointment in 3 months

## 2017-06-10 ENCOUNTER — Other Ambulatory Visit: Payer: Self-pay | Admitting: Adult Health

## 2017-06-10 ENCOUNTER — Telehealth: Payer: Self-pay | Admitting: Adult Health

## 2017-06-10 ENCOUNTER — Other Ambulatory Visit: Payer: Self-pay | Admitting: *Deleted

## 2017-06-10 DIAGNOSIS — IMO0001 Reserved for inherently not codable concepts without codable children: Secondary | ICD-10-CM

## 2017-06-10 DIAGNOSIS — E1165 Type 2 diabetes mellitus with hyperglycemia: Principal | ICD-10-CM

## 2017-06-10 MED ORDER — LISINOPRIL 2.5 MG PO TABS: 2.5000 mg | ORAL_TABLET | Freq: Every day | ORAL | 0 refills | Status: DC

## 2017-06-10 NOTE — Telephone Encounter (Signed)
Sent to the pharmacy by e-scribe. 

## 2017-06-10 NOTE — Telephone Encounter (Signed)
Copied from [place] [id number]. Topic: Quick Communication - See Telephone Encounter >> Jun 10, 2017 11:13 AM Ether Griffins B wrote: CRM for notification. See Telephone encounter for:  Pt needing BP meds refilled. Pt is out and at pharmacy. Please call daughter once its been called in.  06/10/17.

## 2017-06-10 NOTE — Telephone Encounter (Signed)
Spoke with patient's daughter- he is using Lisinopril. She is aware he will  need annual exam in February and she will call to make that appointment since she translates for him.

## 2017-06-16 ENCOUNTER — Telehealth: Payer: Self-pay | Admitting: Adult Health

## 2017-06-16 NOTE — Telephone Encounter (Unsigned)
Copied from [place]. Topic: Inquiry >> Jun 16, 2017  9:39 AM Neva Seat wrote:  Pt's daughter called and said her father's Rx was not at [facility].  Out of medication - since last week.  [facility] [place], [place]

## 2017-06-16 NOTE — Telephone Encounter (Signed)
Spoke with daughter. Asked her to call [facility] and [facility]. Phone number provided, Pharmacy taken off record per daughters request. Med was lisinopril.

## 2017-06-18 ENCOUNTER — Encounter: Payer: Self-pay | Admitting: Endocrinology

## 2017-06-18 ENCOUNTER — Ambulatory Visit: Payer: No Typology Code available for payment source | Admitting: Endocrinology

## 2017-06-18 VITALS — BP 112/70 | HR 71 | Wt 143.8 lb

## 2017-06-18 DIAGNOSIS — E119 Type 2 diabetes mellitus without complications: Secondary | ICD-10-CM

## 2017-06-18 MED ORDER — INSULIN LISPRO PROT & LISPRO (75-25 MIX) 100 UNIT/ML KWIKPEN: 10.0000 [IU] | PEN_INJECTOR | Freq: Every day | SUBCUTANEOUS | 11 refills | Status: DC

## 2017-06-18 NOTE — Patient Instructions (Addendum)
check your blood sugar twice a day.  vary the time of day when you check, between before the 3 meals, and at bedtime.  also check if you have symptoms of your blood sugar being too high or too low.  please keep a record of the readings and bring it to your next appointment here (or you can bring the meter itself).  You can write it on any piece of paper.  please call us sooner if your blood sugar goes below 70, or if you have a lot of readings over 200.  Please change the insulin to a type you take once a day, with breakfast.  I have sent a prescription to your pharmacy.   On this type of insulin schedule, you should eat meals on a regular schedule.  If a meal is missed or significantly delayed, your blood sugar could go low.   Please come back for a follow-up appointment in 2 months.

## 2017-06-18 NOTE — Progress Notes (Signed)
Subjective:    Patient ID: Michael Hamilton, male    DOB: Oct 21, 1960, 56 y.o.   MRN: [id number]  HPI Pt returns for f/u of diabetes mellitus: DM type: Insulin-requiring type 2 (but he may be developing type 1) Dx'ed: 1761 Complications: polyneuropathy Therapy: insulin since 2018.  DKA: never.  Severe hypoglycemia: never Pancreatitis: never.  Pancreatic imaging: normal on 2009 CT. Other: he takes QD insulin, after poor results with multiple daily injections; he works [employer], 1st shift.   Interval history:  He ran out of the humalog just today.  pt states he feels well in general.   Past Medical History:  Diagnosis Date  . Diabetes mellitus ([place])   . Essential hypertension   . Frequent headaches     No past surgical history on file.  Social History   Socioeconomic History  . Marital status: Married    Spouse name: Not on file  . Number of children: Not on file  . Years of education: Not on file  . Highest education level: Not on file  Social Needs  . Financial resource strain: Not on file  . Food insecurity - worry: Not on file  . Food insecurity - inability: Not on file  . Transportation needs - medical: Not on file  . Transportation needs - non-medical: Not on file  Occupational History  . Not on file  Tobacco Use  . Smoking status: Never Smoker  . Smokeless tobacco: Never Used  Substance and Sexual Activity  . Alcohol use: No  . Drug use: No  . Sexual activity: Yes    Birth control/protection: None  Other Topics Concern  . Not on file  Social History Narrative   Works for [employer]     Current Outpatient Medications on File Prior to Visit  Medication Sig Dispense Refill  . glucose blood (ONETOUCH VERIO) test strip Test blood sugars as directed. Dx: E11.9 100 each 12  . HYDROcodone-acetaminophen (NORCO/VICODIN) 5-325 MG tablet Take 1 tablet by mouth every 6 (six) hours as needed for moderate pain.    Marland Kitchen ibuprofen (ADVIL,MOTRIN) 600 MG tablet Take 600 mg  by mouth every 6 (six) hours as needed.    Marland Kitchen ketorolac (TORADOL) 10 MG tablet Take 10 mg by mouth every 6 (six) hours as needed.    Marland Kitchen lisinopril (ZESTRIL) 2.5 MG tablet Take 1 tablet (2.5 mg total) by mouth daily. 90 tablet 0  . methocarbamol (ROBAXIN) 500 MG tablet Take 500 mg by mouth every 6 (six) hours as needed for muscle spasms.    . ONE TOUCH LANCETS MISC Test blood sugars as directed. Dx: E11.9 100 each 3  . traMADol (ULTRAM) 50 MG tablet Take 50 mg by mouth every 6 (six) hours as needed.    . [DISCONTINUED] loratadine (CLARITIN) 10 MG tablet Take 1 tablet (10 mg total) by mouth daily. 10 tablet 0   No current facility-administered medications on file prior to visit.     No Known Allergies  Family History  Problem Relation Age of Onset  . Diabetes Neg Hx     BP 112/70 (BP Location: Left Arm, Patient Position: Sitting, Cuff Size: Normal)   Pulse 71   Wt 143 lb 12.8 oz (65.2 kg)   SpO2 98%   BMI 23.93 kg/m    Review of Systems He denies hypoglycemia.     Objective:   Physical Exam VITAL SIGNS:  See vs page GENERAL: no distress Pulses: dorsalis pedis intact bilat.   MSK: no  deformity of the feet CV: no leg edema.  Skin:  no ulcer on the feet.  normal color and temp on the feet.  Neuro: sensation is intact to touch on the feet, but decreased from normal.       Assessment & Plan:  Insulin-requiring type 2 DM: he needs increased rx    Patient Instructions  check your blood sugar twice a day.  vary the time of day when you check, between before the 3 meals, and at bedtime.  also check if you have symptoms of your blood sugar being too high or too low.  please keep a record of the readings and bring it to your next appointment here (or you can bring the meter itself).  You can write it on any piece of paper.  please call us sooner if your blood sugar goes below 70, or if you have a lot of readings over 200.  Please change the insulin to a type you take once a day, with  breakfast.  I have sent a prescription to your pharmacy.   On this type of insulin schedule, you should eat meals on a regular schedule.  If a meal is missed or significantly delayed, your blood sugar could go low.   Please come back for a follow-up appointment in 2 months.

## 2017-08-19 ENCOUNTER — Encounter: Payer: Self-pay | Admitting: Endocrinology

## 2017-08-19 ENCOUNTER — Ambulatory Visit: Payer: No Typology Code available for payment source | Admitting: Endocrinology

## 2017-08-19 VITALS — BP 118/82 | HR 71 | Wt 147.4 lb

## 2017-08-19 DIAGNOSIS — E119 Type 2 diabetes mellitus without complications: Secondary | ICD-10-CM

## 2017-08-19 LAB — POCT GLYCOSYLATED HEMOGLOBIN (HGB A1C): HEMOGLOBIN A1C: 7.5

## 2017-08-19 MED ORDER — INSULIN LISPRO PROT & LISPRO (75-25 MIX) 100 UNIT/ML KWIKPEN: 10.0000 [IU] | PEN_INJECTOR | Freq: Every day | SUBCUTANEOUS | 11 refills | Status: DC

## 2017-08-19 NOTE — Patient Instructions (Addendum)
check your blood sugar twice a day.  vary the time of day when you check, between before the 3 meals, and at bedtime.  also check if you have symptoms of your blood sugar being too high or too low.  please keep a record of the readings and bring it to your next appointment here (or you can bring the meter itself).  You can write it on any piece of paper.  please call us sooner if your blood sugar goes below 70, or if you have a lot of readings over 200.   Please continue the same insulin.    On this type of insulin schedule, you should eat meals on a regular schedule.  If a meal is missed or significantly delayed, your blood sugar could go low.   Please come back for a follow-up appointment in 4 months.    

## 2017-08-19 NOTE — Progress Notes (Signed)
Subjective:    Patient ID: Michael Hamilton, male    DOB: [birth year], 57 y.o.   MRN: [id number]  HPI Pt returns for f/u of diabetes mellitus: DM type: Insulin-requiring type 2 (but he may be developing type 1) Dx'ed: 5176 Complications: polyneuropathy Therapy: insulin since 2018.  DKA: never.  Severe hypoglycemia: never Pancreatitis: never.  Pancreatic imaging: normal on 2009 CT. Other: he takes QD insulin, after poor results with multiple daily injections; he works [employer], 1st shift.   Interval history:  He says he never misses the insulin.  pt states he feels well in general.  no cbg record, but states cbg's vary from 82-145.  pt states he feels well in general.   Past Medical History:  Diagnosis Date  . Diabetes mellitus ([place])   . Essential hypertension   . Frequent headaches     No past surgical history on file.  Social History   Socioeconomic History  . Marital status: Married    Spouse name: Not on file  . Number of children: Not on file  . Years of education: Not on file  . Highest education level: Not on file  Social Needs  . Financial resource strain: Not on file  . Food insecurity - worry: Not on file  . Food insecurity - inability: Not on file  . Transportation needs - medical: Not on file  . Transportation needs - non-medical: Not on file  Occupational History  . Not on file  Tobacco Use  . Smoking status: Never Smoker  . Smokeless tobacco: Never Used  Substance and Sexual Activity  . Alcohol use: No  . Drug use: No  . Sexual activity: Yes    Birth control/protection: None  Other Topics Concern  . Not on file  Social History Narrative   Works for [employer]     Current Outpatient Medications on File Prior to Visit  Medication Sig Dispense Refill  . glucose blood (ONETOUCH VERIO) test strip Test blood sugars as directed. Dx: E11.9 100 each 12  . ibuprofen (ADVIL,MOTRIN) 600 MG tablet Take 600 mg by mouth every 6 (six) hours as needed.    Marland Kitchen  ketorolac (TORADOL) 10 MG tablet Take 10 mg by mouth every 6 (six) hours as needed.    Marland Kitchen lisinopril (ZESTRIL) 2.5 MG tablet Take 1 tablet (2.5 mg total) by mouth daily. 90 tablet 0  . methocarbamol (ROBAXIN) 500 MG tablet Take 500 mg by mouth every 6 (six) hours as needed for muscle spasms.    . ONE TOUCH LANCETS MISC Test blood sugars as directed. Dx: E11.9 100 each 3  . traMADol (ULTRAM) 50 MG tablet Take 50 mg by mouth every 6 (six) hours as needed.    . [DISCONTINUED] loratadine (CLARITIN) 10 MG tablet Take 1 tablet (10 mg total) by mouth daily. 10 tablet 0   No current facility-administered medications on file prior to visit.     No Known Allergies  Family History  Problem Relation Age of Onset  . Diabetes Neg Hx     BP 118/82 (BP Location: Left Arm, Patient Position: Sitting, Cuff Size: Normal)   Pulse 71   Wt 147 lb 6.4 oz (66.9 kg)   SpO2 98%   BMI 24.53 kg/m    Review of Systems He denies hypoglycemia    Objective:   Physical Exam VITAL SIGNS:  See vs page GENERAL: no distress Pulses: dorsalis pedis intact bilat.   MSK: no deformity of the feet CV: no leg  edema.  Skin:  no ulcer on the feet.  normal color and temp on the feet.  Neuro: sensation is intact to touch on the feet, but decreased from normal.   Lab Results  Component Value Date   HGBA1C 7.5 08/19/2017       Assessment & Plan:  Insulin-requiring type 2 DM, with polyneuropathy: this is the best control this pt should aim for, given this regimen, which does match insulin to his changing needs throughout the day  Patient Instructions  check your blood sugar twice a day.  vary the time of day when you check, between before the 3 meals, and at bedtime.  also check if you have symptoms of your blood sugar being too high or too low.  please keep a record of the readings and bring it to your next appointment here (or you can bring the meter itself).  You can write it on any piece of paper.  please call us  sooner if your blood sugar goes below 70, or if you have a lot of readings over 200.  Please continue the same insulin.   On this type of insulin schedule, you should eat meals on a regular schedule.  If a meal is missed or significantly delayed, your blood sugar could go low.   Please come back for a follow-up appointment in 4 months.

## 2017-09-18 ENCOUNTER — Other Ambulatory Visit: Payer: Self-pay | Admitting: Adult Health

## 2017-09-18 DIAGNOSIS — E1165 Type 2 diabetes mellitus with hyperglycemia: Principal | ICD-10-CM

## 2017-09-18 DIAGNOSIS — IMO0001 Reserved for inherently not codable concepts without codable children: Secondary | ICD-10-CM

## 2017-09-19 ENCOUNTER — Encounter: Payer: Self-pay | Admitting: Family Medicine

## 2017-09-19 NOTE — Telephone Encounter (Signed)
30 day supply sent to the pharmacy.  Letter sent to the pt by mail.

## 2017-11-18 ENCOUNTER — Other Ambulatory Visit: Payer: Self-pay | Admitting: Family Medicine

## 2017-11-18 DIAGNOSIS — IMO0001 Reserved for inherently not codable concepts without codable children: Secondary | ICD-10-CM

## 2017-11-18 DIAGNOSIS — E1165 Type 2 diabetes mellitus with hyperglycemia: Principal | ICD-10-CM

## 2017-11-18 NOTE — Telephone Encounter (Signed)
Denied.  Pt needs an appointment for further refills of this medication.

## 2017-12-17 ENCOUNTER — Encounter: Payer: Self-pay | Admitting: Endocrinology

## 2017-12-17 ENCOUNTER — Ambulatory Visit: Payer: No Typology Code available for payment source | Admitting: Endocrinology

## 2017-12-17 VITALS — BP 110/62 | HR 72 | Wt 151.8 lb

## 2017-12-17 DIAGNOSIS — E119 Type 2 diabetes mellitus without complications: Secondary | ICD-10-CM | POA: Diagnosis not present

## 2017-12-17 LAB — POCT GLYCOSYLATED HEMOGLOBIN (HGB A1C): HEMOGLOBIN A1C: 8.1 % — AB (ref 4.0–5.6)

## 2017-12-17 MED ORDER — INSULIN LISPRO PROT & LISPRO (75-25 MIX) 100 UNIT/ML KWIKPEN: 12.0000 [IU] | PEN_INJECTOR | Freq: Every day | SUBCUTANEOUS | 11 refills | Status: DC

## 2017-12-17 NOTE — Progress Notes (Signed)
Subjective:    Patient ID: Michael Hamilton, male    DOB: [birth year], 57 y.o.   MRN: [id number]  HPI Pt returns for f/u of diabetes mellitus: DM type: Insulin-requiring type 2 (but he may be developing type 1) Dx'ed: 8676 Complications: polyneuropathy Therapy: insulin since 2018.  DKA: never.  Severe hypoglycemia: never Pancreatitis: never.  Pancreatic imaging: normal on 2009 CT. Other: he takes QD insulin, after poor results with multiple daily injections; he works [employer], 1st shift.   Interval history:  He says he never misses the insulin. no cbg record, but states cbg's are in the low-100's.   Past Medical History:  Diagnosis Date  . Diabetes mellitus ([place])   . Essential hypertension   . Frequent headaches     History reviewed. No pertinent surgical history.  Social History   Socioeconomic History  . Marital status: Married    Spouse name: Not on file  . Number of children: Not on file  . Years of education: Not on file  . Highest education level: Not on file  Occupational History  . Not on file  Social Needs  . Financial resource strain: Not on file  . Food insecurity:    Worry: Not on file    Inability: Not on file  . Transportation needs:    Medical: Not on file    Non-medical: Not on file  Tobacco Use  . Smoking status: Never Smoker  . Smokeless tobacco: Never Used  Substance and Sexual Activity  . Alcohol use: No  . Drug use: No  . Sexual activity: Yes    Birth control/protection: None  Lifestyle  . Physical activity:    Days per week: Not on file    Minutes per session: Not on file  . Stress: Not on file  Relationships  . Social connections:    Talks on phone: Not on file    Gets together: Not on file    Attends religious service: Not on file    Active member of club or organization: Not on file    Attends meetings of clubs or organizations: Not on file    Relationship status: Not on file  . Intimate partner violence:    Fear of current or ex  partner: Not on file    Emotionally abused: Not on file    Physically abused: Not on file    Forced sexual activity: Not on file  Other Topics Concern  . Not on file  Social History Narrative   Works for [employer]     Current Outpatient Medications on File Prior to Visit  Medication Sig Dispense Refill  . glucose blood (ONETOUCH VERIO) test strip Test blood sugars as directed. Dx: E11.9 100 each 12  . ibuprofen (ADVIL,MOTRIN) 600 MG tablet Take 600 mg by mouth every 6 (six) hours as needed.    Marland Kitchen ketorolac (TORADOL) 10 MG tablet Take 10 mg by mouth every 6 (six) hours as needed.    Marland Kitchen lisinopril (PRINIVIL,ZESTRIL) 2.5 MG tablet TAKE 1 TABLET BY MOUTH ONCE DAILY 30 tablet 0  . methocarbamol (ROBAXIN) 500 MG tablet Take 500 mg by mouth every 6 (six) hours as needed for muscle spasms.    . ONE TOUCH LANCETS MISC Test blood sugars as directed. Dx: E11.9 100 each 3  . traMADol (ULTRAM) 50 MG tablet Take 50 mg by mouth every 6 (six) hours as needed.    . [DISCONTINUED] loratadine (CLARITIN) 10 MG tablet Take 1 tablet (10 mg total)  by mouth daily. 10 tablet 0   No current facility-administered medications on file prior to visit.     No Known Allergies  Family History  Problem Relation Age of Onset  . Diabetes Neg Hx     BP 110/62   Pulse 72   Wt 151 lb 12.8 oz (68.9 kg)   SpO2 96%   BMI 25.26 kg/m    Review of Systems He denies hypoglycemia.  He has right shoulder pain x 2 years, despite 2 surgeries.      Objective:   Physical Exam VITAL SIGNS:  See vs page GENERAL: no distress RIGHT SHOULDER: full rom, but rom is painful.  Pulses: foot pulses are intact bilaterally.   MSK: no deformity of the feet or ankles.  CV: no edema of the legs or ankles Skin:  no ulcer on the feet or ankles.  normal color and temp on the feet and ankles Neuro: sensation is intact to touch on the feet and ankles, but decreased from normal.   Lab Results  Component Value Date   HGBA1C 8.1 (A)  12/17/2017      Assessment & Plan:  Insulin-requiring type 2 DM, with polyneuropathy: worse.  Shoulder pain: we discussed: he declines ref back to ortho.   Patient Instructions  check your blood sugar twice a day.  vary the time of day when you check, between before the 3 meals, and at bedtime.  also check if you have symptoms of your blood sugar being too high or too low.  please keep a record of the readings and bring it to your next appointment here (or you can bring the meter itself).  You can write it on any piece of paper.  please call us sooner if your blood sugar goes below 70, or if you have a lot of readings over 200.  Please increase the insulin to 12 units with breakfast. Please see your primary care provider about your shoulder.  On this type of insulin schedule, you should eat meals on a regular schedule.  If a meal is missed or significantly delayed, your blood sugar could go low.   Please come back for a follow-up appointment in 3 months.

## 2017-12-17 NOTE — Patient Instructions (Addendum)
check your blood sugar twice a day.  vary the time of day when you check, between before the 3 meals, and at bedtime.  also check if you have symptoms of your blood sugar being too high or too low.  please keep a record of the readings and bring it to your next appointment here (or you can bring the meter itself).  You can write it on any piece of paper.  please call us sooner if your blood sugar goes below 70, or if you have a lot of readings over 200.  Please increase the insulin to 12 units with breakfast. Please see your primary care provider about your shoulder.  On this type of insulin schedule, you should eat meals on a regular schedule.  If a meal is missed or significantly delayed, your blood sugar could go low.   Please come back for a follow-up appointment in 3 months.

## 2017-12-19 ENCOUNTER — Other Ambulatory Visit: Payer: Self-pay | Admitting: Adult Health

## 2017-12-19 DIAGNOSIS — E1165 Type 2 diabetes mellitus with hyperglycemia: Principal | ICD-10-CM

## 2017-12-19 DIAGNOSIS — IMO0001 Reserved for inherently not codable concepts without codable children: Secondary | ICD-10-CM

## 2017-12-19 NOTE — Telephone Encounter (Signed)
Denied.  Pt needs an appt.  Mailed a letter to the pt in March for him to call and schedule.

## 2018-03-19 ENCOUNTER — Encounter: Payer: Self-pay | Admitting: Endocrinology

## 2018-03-19 ENCOUNTER — Ambulatory Visit: Payer: Self-pay | Admitting: Endocrinology

## 2018-03-19 VITALS — BP 130/88 | HR 63 | Ht 65.0 in | Wt 152.2 lb

## 2018-03-19 DIAGNOSIS — E119 Type 2 diabetes mellitus without complications: Secondary | ICD-10-CM

## 2018-03-19 LAB — POCT GLYCOSYLATED HEMOGLOBIN (HGB A1C): HEMOGLOBIN A1C: 8 % — AB (ref 4.0–5.6)

## 2018-03-19 NOTE — Progress Notes (Signed)
Subjective:    Patient ID: Michael Hamilton, male    DOB: 03/02/1961, 57 y.o.   MRN: [id number]  HPI Pt returns for f/u of diabetes mellitus: DM type: Insulin-requiring type 2 (but he may be developing type 1) Dx'ed: 2423 Complications: polyneuropathy Therapy: insulin since 2018.  DKA: never.  Severe hypoglycemia: never Pancreatitis: never.  Pancreatic imaging: normal on 2009 CT. Other: he takes QD insulin, after poor results with multiple daily injections; he works [employer], 1st shift.   Interval history:  He says he never misses the insulin. no cbg record, but states cbg's are well-controlled.  He takes 10 units qam.  No translator is available.   Past Medical History:  Diagnosis Date  . Diabetes mellitus ([place])   . Essential hypertension   . Frequent headaches     No past surgical history on file.  Social History   Socioeconomic History  . Marital status: Married    Spouse name: Not on file  . Number of children: Not on file  . Years of education: Not on file  . Highest education level: Not on file  Occupational History  . Not on file  Social Needs  . Financial resource strain: Not on file  . Food insecurity:    Worry: Not on file    Inability: Not on file  . Transportation needs:    Medical: Not on file    Non-medical: Not on file  Tobacco Use  . Smoking status: Never Smoker  . Smokeless tobacco: Never Used  Substance and Sexual Activity  . Alcohol use: No  . Drug use: No  . Sexual activity: Yes    Birth control/protection: None  Lifestyle  . Physical activity:    Days per week: Not on file    Minutes per session: Not on file  . Stress: Not on file  Relationships  . Social connections:    Talks on phone: Not on file    Gets together: Not on file    Attends religious service: Not on file    Active member of club or organization: Not on file    Attends meetings of clubs or organizations: Not on file    Relationship status: Not on file  . Intimate  partner violence:    Fear of current or ex partner: Not on file    Emotionally abused: Not on file    Physically abused: Not on file    Forced sexual activity: Not on file  Other Topics Concern  . Not on file  Social History Narrative   Works for [employer]     Current Outpatient Medications on File Prior to Visit  Medication Sig Dispense Refill  . glucose blood (ONETOUCH VERIO) test strip Test blood sugars as directed. Dx: E11.9 100 each 12  . ibuprofen (ADVIL,MOTRIN) 600 MG tablet Take 600 mg by mouth every 6 (six) hours as needed.    . Insulin Lispro Prot & Lispro (HUMALOG MIX 75/25 KWIKPEN) (75-25) 100 UNIT/ML Kwikpen Inject 12 Units into the skin daily with breakfast. And pen needles 1/day 15 mL 11  . ketorolac (TORADOL) 10 MG tablet Take 10 mg by mouth every 6 (six) hours as needed.    Marland Kitchen lisinopril (PRINIVIL,ZESTRIL) 2.5 MG tablet TAKE 1 TABLET BY MOUTH ONCE DAILY 30 tablet 0  . methocarbamol (ROBAXIN) 500 MG tablet Take 500 mg by mouth every 6 (six) hours as needed for muscle spasms.    . ONE TOUCH LANCETS MISC Test blood sugars as  directed. Dx: E11.9 100 each 3  . traMADol (ULTRAM) 50 MG tablet Take 50 mg by mouth every 6 (six) hours as needed.    . [DISCONTINUED] loratadine (CLARITIN) 10 MG tablet Take 1 tablet (10 mg total) by mouth daily. 10 tablet 0   No current facility-administered medications on file prior to visit.     No Known Allergies  Family History  Problem Relation Age of Onset  . Diabetes Neg Hx     BP 130/88 (BP Location: Left Arm, Patient Position: Sitting, Cuff Size: Normal)   Pulse 63   Ht 5\' 5"  (1.651 m)   Wt 152 lb 3.2 oz (69 kg)   SpO2 99%   BMI 25.33 kg/m    Review of Systems E denies hypoglycemia    Objective:   Physical Exam VITAL SIGNS:  See vs page GENERAL: no distress Pulses: foot pulses are intact bilaterally.   MSK: no deformity of the feet or ankles.  CV: no edema of the legs or ankles Skin:  no ulcer on the feet or ankles.   normal color and temp on the feet and ankles Neuro: sensation is intact to touch on the feet and ankles.    Lab Results  Component Value Date   HGBA1C 8.0 (A) 03/19/2018       Assessment & Plan:  Insulin-requiring type 2 DM, with polyneuropathy: he needs increased rx   Patient Instructions  check your blood sugar once a day.  vary the time of day when you check, between before the 3 meals, and at bedtime.  also check if you have symptoms of your blood sugar being too high or too low.  please keep a record of the readings and bring it to your next appointment here (or you can bring the meter itself).  You can write it on any piece of paper.  please call us sooner if your blood sugar goes below 70, or if you have a lot of readings over 200.  Please increase the insulin to 12 units with breakfast. On this type of insulin schedule, you should eat meals on a regular schedule.  If a meal is missed or significantly delayed, your blood sugar could go low.   Please come back for a follow-up appointment in 3 months.      ki?m tra l??ng ???ng trong mu m?i ngy m?t l?n.  thay ??i th?i gian trong ngy khi b?n ki?m tra, gi?a tr??c 3 b?a ?n v khi ?i ng?. ??ng th?i ki?m tra xem b?n c tri?u ch?ng no v? l??ng ???ng trong mu qu cao hay qu th?p khng. vui lng ghi l?i cc bi ??c v mang n ??n cu?c h?n ti?p theo c?a b?n ? ?y (ho?c b?n c th? t? mang ??ng h?). B?n c th? vi?t n trn b?t k? m?nh gi?y. vui lng g?i cho chng ti s?m h?n n?u l??ng ???ng trong mu c?a b?n d??i 70, ho?c n?u b?n c nhi?u bi ??c h?n 200. Vui lng t?ng insulin ln 12 ??n v? v?i b?a sng. Theo lo?i l?ch trnh insulin ny, b?n nn ?n b?a ?n theo l?ch trnh th??ng xuyn. N?u m?t b?a ?n b? b? l? ho?c tr hon ?ng k?, l??ng ???ng trong mu c?a b?n c th? xu?ng th?p. Vui lng quay l?i ?? h?n ti khm sau 3 thng.

## 2018-03-19 NOTE — Patient Instructions (Addendum)
check your blood sugar once a day.  vary the time of day when you check, between before the 3 meals, and at bedtime.  also check if you have symptoms of your blood sugar being too high or too low.  please keep a record of the readings and bring it to your next appointment here (or you can bring the meter itself).  You can write it on any piece of paper.  please call us sooner if your blood sugar goes below 70, or if you have a lot of readings over 200.  Please increase the insulin to 12 units with breakfast. On this type of insulin schedule, you should eat meals on a regular schedule.  If a meal is missed or significantly delayed, your blood sugar could go low.   Please come back for a follow-up appointment in 3 months.      ki?m tra l??ng ???ng trong mu m?i ngy m?t l?n.  thay ??i th?i gian trong ngy khi b?n ki?m tra, gi?a tr??c 3 b?a ?n v khi ?i ng?. ??ng th?i ki?m tra xem b?n c tri?u ch?ng no v? l??ng ???ng trong mu qu cao hay qu th?p khng. vui lng ghi l?i cc bi ??c v mang n ??n cu?c h?n ti?p theo c?a b?n ? ?y (ho?c b?n c th? t? mang ??ng h?). B?n c th? vi?t n trn b?t k? m?nh gi?y. vui lng g?i cho chng ti s?m h?n n?u l??ng ???ng trong mu c?a b?n d??i 70, ho?c n?u b?n c nhi?u bi ??c h?n 200. Vui lng t?ng insulin ln 12 ??n v? v?i b?a sng. Theo lo?i l?ch trnh insulin ny, b?n nn ?n b?a ?n theo l?ch trnh th??ng xuyn. N?u m?t b?a ?n b? b? l? ho?c tr hon ?ng k?, l??ng ???ng trong mu c?a b?n c th? xu?ng th?p. Vui lng quay l?i ?? h?n ti khm sau 3 thng.

## 2018-06-24 ENCOUNTER — Ambulatory Visit (INDEPENDENT_AMBULATORY_CARE_PROVIDER_SITE_OTHER): Payer: Medicaid Other | Admitting: Endocrinology

## 2018-06-24 ENCOUNTER — Encounter: Payer: Self-pay | Admitting: Endocrinology

## 2018-06-24 ENCOUNTER — Encounter: Payer: Self-pay | Admitting: Internal Medicine

## 2018-06-24 ENCOUNTER — Ambulatory Visit (INDEPENDENT_AMBULATORY_CARE_PROVIDER_SITE_OTHER): Payer: Self-pay | Admitting: Internal Medicine

## 2018-06-24 VITALS — BP 122/84 | HR 69 | Ht 65.0 in | Wt 152.2 lb

## 2018-06-24 VITALS — BP 121/74 | HR 67 | Temp 98.0°F | Ht 65.0 in | Wt 152.8 lb

## 2018-06-24 DIAGNOSIS — Z91120 Patient's intentional underdosing of medication regimen due to financial hardship: Secondary | ICD-10-CM

## 2018-06-24 DIAGNOSIS — Z79899 Other long term (current) drug therapy: Secondary | ICD-10-CM

## 2018-06-24 DIAGNOSIS — Z794 Long term (current) use of insulin: Secondary | ICD-10-CM

## 2018-06-24 DIAGNOSIS — Z0000 Encounter for general adult medical examination without abnormal findings: Secondary | ICD-10-CM | POA: Insufficient documentation

## 2018-06-24 DIAGNOSIS — E118 Type 2 diabetes mellitus with unspecified complications: Secondary | ICD-10-CM

## 2018-06-24 DIAGNOSIS — E119 Type 2 diabetes mellitus without complications: Secondary | ICD-10-CM

## 2018-06-24 DIAGNOSIS — I10 Essential (primary) hypertension: Secondary | ICD-10-CM | POA: Insufficient documentation

## 2018-06-24 DIAGNOSIS — B1920 Unspecified viral hepatitis C without hepatic coma: Secondary | ICD-10-CM

## 2018-06-24 LAB — POCT GLYCOSYLATED HEMOGLOBIN (HGB A1C): HEMOGLOBIN A1C: 8.1 % — AB (ref 4.0–5.6)

## 2018-06-24 MED ORDER — INSULIN LISPRO PROT & LISPRO (75-25 MIX) 100 UNIT/ML KWIKPEN: 12.0000 [IU] | PEN_INJECTOR | Freq: Every day | SUBCUTANEOUS | 11 refills | Status: DC

## 2018-06-24 MED ORDER — INSULIN GLARGINE 100 UNIT/ML SOLOSTAR PEN: 10.0000 [IU] | PEN_INJECTOR | Freq: Every day | SUBCUTANEOUS | 0 refills | Status: DC

## 2018-06-24 MED ORDER — INSULIN PEN NEEDLE 31G X 5 MM MISC: 2 refills | Status: DC

## 2018-06-24 MED ORDER — LISINOPRIL 2.5 MG PO TABS: 2.5000 mg | ORAL_TABLET | Freq: Every day | ORAL | 1 refills | Status: DC

## 2018-06-24 MED ORDER — METFORMIN HCL ER 500 MG PO TB24: 1000.0000 mg | ORAL_TABLET | Freq: Two times a day (BID) | ORAL | 2 refills | Status: DC

## 2018-06-24 MED FILL — LANTUS SOLOSTAR 100 UNITS/M: 100 | 30 days supply | Qty: 3 | Fill #0

## 2018-06-24 MED FILL — UNIFINE PENTIPS 31GX3/16": 31G X 5 MM | 90 days supply | Qty: 100 | Fill #0

## 2018-06-24 MED FILL — metFORMIN HCL ER 500 MG TB2: 500 | 30 days supply | Qty: 120 | Fill #0

## 2018-06-24 MED FILL — UNIFINE PENTIPS 31GX3/16: 31G X 5 MM | 90 days supply | Qty: 100 | Fill #0

## 2018-06-24 MED FILL — LISINOPRIL 2.5 MG TABLET: 2.5 | 30 days supply | Qty: 30 | Fill #0

## 2018-06-24 NOTE — Progress Notes (Signed)
   CC: Establish care  HPI:  [name] is a 57 y.o. male who presented to the clinic to establish care for continued evaluation and management of his chronic medical illnesses. For a detailed assessment and plan please refer to problem based charting below.   Past Medical History:  Diagnosis Date  . Diabetes mellitus ([place])   . Essential hypertension   . Frequent headaches   . Rotator cuff arthropathy of right shoulder    Past Surgical History:  Procedure Laterality Date  . SHOULDER ARTHROSCOPY W/ ROTATOR CUFF REPAIR Right    Family History  Problem Relation Age of Onset  . Diabetes Neg Hx    Social Hx: Unemployed x 2 years, previously worked in [place] to [place] in 2004  Married with 7 children, ages range from 26-10  Denies use of tobacco, EtOH, or illicit substances.   Review of Systems:  12 point ROS preformed. All negative aside from those mentioned in the HPI.  Physical Exam: Vitals:   06/24/18 1455  BP: 121/74  Pulse: 67  Temp: 98 F (36.7 C)  TempSrc: Oral  SpO2: 99%  Weight: 152 lb 12.8 oz (69.3 kg)  Height: 5\' 5"  (1.651 m)   General: Well nourished male in no acute distress HENT: Normocephalic, atraumatic, moist mucus membranes Pulm: Good air movement with no wheezing or crackles  CV: RRR, no murmurs, no rubs  Abdomen: Active bowel sounds, soft, non-distended, no tenderness to palpation  Extremities: Pulses palpable in all extremities, no LE edema  Skin: Warm and dry  Neuro: Alert and oriented x 3  Assessment & Plan:   See Encounters Tab for problem based charting.  Patient discussed with Dr. Lynnae January

## 2018-06-24 NOTE — Assessment & Plan Note (Signed)
Checking form hepatitis B and C given patient is from endemic country.

## 2018-06-24 NOTE — Patient Instructions (Addendum)
check your blood sugar once a day.  vary the time of day when you check, between before the 3 meals, and at bedtime.  also check if you have symptoms of your blood sugar being too high or too low.  please keep a record of the readings and bring it to your next appointment here (or you can bring the meter itself).  You can write it on any piece of paper.  please call us sooner if your blood sugar goes below 70, or if you have a lot of readings over 200.  I have sent a prescription to [place], for the insulin. On this type of insulin schedule, you should eat meals on a regular schedule.  If a meal is missed or significantly delayed, your blood sugar could go low.   please call [phone] ( physician referral line), to get an appointment with a new primary care provider.   Please come back for a follow-up appointment in 2 months.     ki?m tra l??ng ???ng trong mu m?i ngy m?t l?n. thay ??i th?i gian trong ngy khi b?n ki?m tra, gi?a tr??c 3 b?a ?n v khi ?i ng?. ??ng th?i ki?m tra xem b?n c tri?u ch?ng no v? l??ng ???ng trong mu qu cao hay qu th?p khng. vui lng ghi l?i cc bi ??c v mang n ??n cu?c h?n ti?p theo c?a b?n ? ?y (ho?c b?n c th? t? mang ??ng h?). B?n c th? vi?t n trn b?t k? m?nh gi?y. vui lng g?i cho chng ti s?m h?n n?u l??ng ???ng trong mu c?a b?n d??i 70, ho?c n?u b?n c nhi?u bi ??c h?n 200. Ti ? g?i ??n thu?c ??n S?c kh?e C?ng ??ng v S?c kh?e, cho insulin. Theo lo?i l?ch trnh insulin ny, b?n nn ?n b?a ?n theo l?ch trnh th??ng xuyn. N?u m?t b?a ?n b? b? l? ho?c tr hon ?ng k?, l??ng ???ng trong mu c?a b?n c th? xu?ng th?p. vui lng g?i [phone] (???ng dy gi?i thi?u bc s? hnh nn), ?? l?y h?n v?i nh cung c?p d?ch v? ch?m [place] chnh m?i. Vui lng quay l?i ?? h?n ti khm sau 2 thng n?a.

## 2018-06-24 NOTE — Progress Notes (Signed)
Internal Medicine Clinic Attending  Case discussed with Dr. Helberg at the time of the visit.  We reviewed the resident's history and exam and pertinent patient test results.  I agree with the assessment, diagnosis, and plan of care documented in the resident's note.    

## 2018-06-24 NOTE — Progress Notes (Signed)
Subjective:    Patient ID: Michael Hamilton, male    DOB: Jun 26, 1961, 57 y.o.   MRN: [id number]  HPI Pt returns for f/u of diabetes mellitus: DM type: Insulin-requiring type 2 (but he may be developing type 1) Dx'ed: 9702 Complications: polyneuropathy Therapy: insulin since 2018.  DKA: never.  Severe hypoglycemia: never Pancreatitis: never.  Pancreatic imaging: normal on 2009 CT. Other: he takes QD insulin, after poor results with multiple daily injections; he stopped working, due to shoulder pain Interval history: no cbg record, but states cbg's vary from 115-150.  There is no trend throughout the day.  Pt says he has been off insulin x 2 months.   Past Medical History:  Diagnosis Date  . Diabetes mellitus ([place])   . Essential hypertension   . Frequent headaches   . Rotator cuff arthropathy of right shoulder     Past Surgical History:  Procedure Laterality Date  . SHOULDER ARTHROSCOPY W/ ROTATOR CUFF REPAIR Right     Social History   Socioeconomic History  . Marital status: Married    Spouse name: Not on file  . Number of children: Not on file  . Years of education: Not on file  . Highest education level: Not on file  Occupational History  . Not on file  Social Needs  . Financial resource strain: Not on file  . Food insecurity:    Worry: Not on file    Inability: Not on file  . Transportation needs:    Medical: Not on file    Non-medical: Not on file  Tobacco Use  . Smoking status: Never Smoker  . Smokeless tobacco: Never Used  Substance and Sexual Activity  . Alcohol use: No  . Drug use: No  . Sexual activity: Yes    Birth control/protection: None  Lifestyle  . Physical activity:    Days per week: Not on file    Minutes per session: Not on file  . Stress: Not on file  Relationships  . Social connections:    Talks on phone: Not on file    Gets together: Not on file    Attends religious service: Not on file    Active member of club or organization: Not on  file    Attends meetings of clubs or organizations: Not on file    Relationship status: Not on file  . Intimate partner violence:    Fear of current or ex partner: Not on file    Emotionally abused: Not on file    Physically abused: Not on file    Forced sexual activity: Not on file  Other Topics Concern  . Not on file  Social History Narrative   Works for [employer]     Current Outpatient Medications on File Prior to Visit  Medication Sig Dispense Refill  . glucose blood (ONETOUCH VERIO) test strip Test blood sugars as directed. Dx: E11.9 (Patient not taking: Reported on 06/24/2018) 100 each 12  . ibuprofen (ADVIL,MOTRIN) 600 MG tablet Take 600 mg by mouth every 6 (six) hours as needed.    . methocarbamol (ROBAXIN) 500 MG tablet Take 500 mg by mouth every 6 (six) hours as needed for muscle spasms.    . ONE TOUCH LANCETS MISC Test blood sugars as directed. Dx: E11.9 (Patient not taking: Reported on 06/24/2018) 100 each 3  . traMADol (ULTRAM) 50 MG tablet Take 50 mg by mouth every 6 (six) hours as needed.    . [DISCONTINUED] loratadine (CLARITIN) 10 MG tablet  Take 1 tablet (10 mg total) by mouth daily. 10 tablet 0   No current facility-administered medications on file prior to visit.     No Known Allergies  Family History  Problem Relation Age of Onset  . Diabetes Neg Hx     BP 122/84 (BP Location: Left Arm, Patient Position: Sitting, Cuff Size: Normal)   Pulse 69   Ht 5\' 5"  (1.651 m)   Wt 152 lb 3.2 oz (69 kg)   SpO2 99%   BMI 25.33 kg/m    Review of Systems He denies hypoglycemia.      Objective:   Physical Exam VITAL SIGNS:  See vs page GENERAL: no distress Pulses: dorsalis pedis intact bilat.   MSK: no deformity of the feet CV: no leg edema Skin:  no ulcer on the feet.  normal color and temp on the feet.  Neuro: sensation is intact to touch on the feet.     A1c=8.1%    Assessment & Plan:  Insulin-requiring type 2 DM, with polyneuropathy: therapy limited  by noncompliance.   Patient Instructions  check your blood sugar once a day.  vary the time of day when you check, between before the 3 meals, and at bedtime.  also check if you have symptoms of your blood sugar being too high or too low.  please keep a record of the readings and bring it to your next appointment here (or you can bring the meter itself).  You can write it on any piece of paper.  please call us sooner if your blood sugar goes below 70, or if you have a lot of readings over 200.  I have sent a prescription to [place], for the insulin. On this type of insulin schedule, you should eat meals on a regular schedule.  If a meal is missed or significantly delayed, your blood sugar could go low.   please call [phone] ( physician referral line), to get an appointment with a new primary care provider.   Please come back for a follow-up appointment in 2 months.     ki?m tra l??ng ???ng trong mu m?i ngy m?t l?n. thay ??i th?i gian trong ngy khi b?n ki?m tra, gi?a tr??c 3 b?a ?n v khi ?i ng?. ??ng th?i ki?m tra xem b?n c tri?u ch?ng no v? l??ng ???ng trong mu qu cao hay qu th?p khng. vui lng ghi l?i cc bi ??c v mang n ??n cu?c h?n ti?p theo c?a b?n ? ?y (ho?c b?n c th? t? mang ??ng h?). B?n c th? vi?t n trn b?t k? m?nh gi?y. vui lng g?i cho chng ti s?m h?n n?u l??ng ???ng trong mu c?a b?n d??i 70, ho?c n?u b?n c nhi?u bi ??c h?n 200. Ti ? g?i ??n thu?c ??n S?c kh?e C?ng ??ng v S?c kh?e, cho insulin. Theo lo?i l?ch trnh insulin ny, b?n nn ?n b?a ?n theo l?ch trnh th??ng xuyn. N?u m?t b?a ?n b? b? l? ho?c tr hon ?ng k?, l??ng ???ng trong mu c?a b?n c th? xu?ng th?p. vui lng g?i [phone] (???ng dy gi?i thi?u bc s? hnh nn), ?? l?y h?n v?i nh cung c?p d?ch v? ch?m [place] chnh m?i. Vui lng quay l?i ?? h?n ti khm sau 2 thng n?a.

## 2018-06-24 NOTE — Patient Instructions (Addendum)
C?m ?n b?n ? cho php chng ti cung c?p ch?m [place] c?a b?n. Hm nay chng ti ?ang th?c hi?n m?t s? thay ??i cho thu?c c?a b?n. K? ho?ch ny s? ???c chuy?n b?n ra kh?i tim insulin v nh?n ???c b?n v? thu?c u?ng ch?.   1. b?t ??u metformin hng ngy   2. b?t ??u Lantus 10 ??n v? m?i ?m.   3. b?t ??u lisinopril 2,5 mg hng ngy   4. chng ti s? ki?m tra cng vi?c mu v ti s? g?i cho b?n n?u b?t c? ?i?u g l b?t th??ng.   Vui lng quay l?i trong 4 tu?n.   T?ng metformin c?a b?n nh? sau: Tu?n 1:1 my tnh b?ng v?i b?a ?n t?i  My tnh b?ng tu?n 2:1 v?i B?a sng v b?a t?i  My tnh b?ng tu?n 3:1 v?i B?a sng v 2 vin v?i b?a t?i  Tu?n 4:2 vin hai l?n m?i ngy  ____________________________________________________________  Thank you for allowing [place] to provide your care. Today we are making some changes to your medications. The plan will be to transition you off the insulin injections and get you on oral medications only.   1. START metformin daily   2. START Lantus 10 units every night.   3. START Lisinopril 2.5 mg daily   4. We will check blood work and I will call you if anything is abnormal.   Please come back in 4 week.   Increase your metformin as follows: Week 1: 1 tablet with dinner  Week 2: 1 tablet with breakfast and dinner  Week 3: 1 tablet with breakfast and 2 tablets with dinner  Week 4: 2 tablets twice daily

## 2018-06-24 NOTE — Assessment & Plan Note (Signed)
Patient with uncontrolled diabetes mellitus. Diagnosed in 2017. Review of records indicates that he was previously on metformin and mealtime insulin however remained uncontrolled and he was recently switched to a 70/30 insulin. He is not been able to take his insulin in the past two months due to cost. He does check his insulin occasionally and notes numbers in the 200s. He is interested in coming off the insulin injections and transitioning oral medicines. Given that he has been off his insulin for two months and has not commented DKA I think this is a reasonable suggestion. We will start a metformin titration and switch him to Lantus 10 units daily. Both these medications are four dollars per month through the IM program. The patient states that he is able to afford this. We will have them come back in four weeks. If his CBG readings are at goal we will continue to work towards tapering off the long-acting insulin with initiation of other oral medications.  The patient speaks [place] so a significant portion of education was performed. He was able to teach back the metformin titration scale and elaborate on the goals of his treatment. He is not due for another A1c check until March. In the meantime will have frequent follow-up as we work towards getting them off insulin.

## 2018-06-24 NOTE — Assessment & Plan Note (Addendum)
Presented for continued evaluation and management of his well-controlled hypertension. He is presently on lisinopril 2.5 mg daily. Previous UA was significant for proteinuria. We will continue his lisinopril 2.5 mg daily given that he is a diabetic. Prescription sent.

## 2018-06-25 ENCOUNTER — Telehealth: Payer: Self-pay

## 2018-06-25 LAB — BMP8+ANION GAP
Anion Gap: 11 mmol/L (ref 10.0–18.0)
BUN/Creatinine Ratio: 14 (ref 9–20)
BUN: 12 mg/dL (ref 6–24)
CO2: 22 mmol/L (ref 20–29)
Calcium: 8.6 mg/dL — ABNORMAL LOW (ref 8.7–10.2)
Chloride: 99 mmol/L (ref 96–106)
Creatinine, Ser: 0.86 mg/dL (ref 0.76–1.27)
GFR calc Af Amer: 110 mL/min/{1.73_m2} (ref >59)
GFR calc non Af Amer: 96 mL/min/{1.73_m2} (ref >59)
Glucose: 284 mg/dL — ABNORMAL HIGH (ref 65–99)
Potassium: 3.8 mmol/L (ref 3.5–5.2)
Sodium: 132 mmol/L — ABNORMAL LOW (ref 134–144)

## 2018-06-25 LAB — LIPID PANEL
Chol/HDL Ratio: 4.9 ratio (ref 0.0–5.0)
Cholesterol, Total: 153 mg/dL (ref 100–199)
HDL: 31 mg/dL — ABNORMAL LOW (ref >39)
LDL Calculated: 96 mg/dL (ref 0–99)
Triglycerides: 130 mg/dL (ref 0–149)
VLDL CHOLESTEROL CAL: 26 mg/dL (ref 5–40)

## 2018-06-25 LAB — HBCIGM: Hep B C IgM: NEGATIVE

## 2018-06-25 LAB — HEPATITIS B SURFACE ANTIBODY,QUALITATIVE: Hep B Surface Ab, Qual: REACTIVE

## 2018-06-25 LAB — HEPATITIS B CORE AB W/REFLEX: Hep B Core Total Ab: POSITIVE — AB

## 2018-06-25 LAB — HEPATITIS C ANTIBODY: Hep C Virus Ab: >11 s/co ratio — ABNORMAL HIGH (ref 0.0–0.9)

## 2018-06-25 NOTE — Telephone Encounter (Signed)
I tried to call pt to ask if he would be ok with [place] using [facility] to send his Humalog to instead of [facility]/there was no answer and there are no alternate numbers/will try again/thx dmf

## 2018-06-25 NOTE — Telephone Encounter (Signed)
Humalog was Keshonna Valvo/C'ed by PCP yesterday/I also spoke to pt/thx dmf

## 2018-06-26 ENCOUNTER — Telehealth: Payer: Self-pay | Admitting: Internal Medicine

## 2018-06-26 DIAGNOSIS — R768 Other specified abnormal immunological findings in serum: Secondary | ICD-10-CM

## 2018-06-26 DIAGNOSIS — B1920 Unspecified viral hepatitis C without hepatic coma: Secondary | ICD-10-CM

## 2018-06-26 NOTE — Telephone Encounter (Signed)
In error

## 2018-06-26 NOTE — Addendum Note (Signed)
Addended by: Ina Homes T on: 06/26/2018 09:38 AM   Modules accepted: Orders

## 2018-06-26 NOTE — Telephone Encounter (Signed)
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 Will have patient come back in HCV quant and HBV surface antigen to determine chronicity. If HBV surface antigen is positive we will need a HBV quant and a fibro scan to determine need for treatment. Orders have been placed.

## 2018-06-26 NOTE — Telephone Encounter (Signed)
After speaking with Dr. Lynnae January, the surface antigen is not needed for HBV. The results of his test indicate that he likely had a previous infection and is now immune.   I will add on the labs to prior blood collected. No need for patient to come in for another visit yet.

## 2018-06-26 NOTE — Addendum Note (Signed)
Addended by: Ina Homes T on: 06/26/2018 09:41 AM   Modules accepted: Orders

## 2018-06-29 ENCOUNTER — Ambulatory Visit: Payer: Medicaid Other

## 2018-06-29 ENCOUNTER — Other Ambulatory Visit: Payer: Medicaid Other

## 2018-06-29 ENCOUNTER — Other Ambulatory Visit: Payer: Self-pay | Admitting: Internal Medicine

## 2018-06-29 DIAGNOSIS — R768 Other specified abnormal immunological findings in serum: Secondary | ICD-10-CM

## 2018-06-29 DIAGNOSIS — Z0000 Encounter for general adult medical examination without abnormal findings: Secondary | ICD-10-CM

## 2018-06-29 DIAGNOSIS — Z114 Encounter for screening for human immunodeficiency virus [HIV]: Secondary | ICD-10-CM

## 2018-06-29 LAB — HEPATIC FUNCTION PANEL
ALT: 436 IU/L — ABNORMAL HIGH (ref 0–44)
AST: 348 IU/L — ABNORMAL HIGH (ref 0–40)
Albumin: 2.9 g/dL — ABNORMAL LOW (ref 3.5–5.5)
Alkaline Phosphatase: 109 IU/L (ref 39–117)
BILIRUBIN TOTAL: 2.7 mg/dL — AB (ref 0.0–1.2)
Bilirubin, Direct: 1.21 mg/dL — ABNORMAL HIGH (ref 0.00–0.40)
Total Protein: 9.1 g/dL — ABNORMAL HIGH (ref 6.0–8.5)

## 2018-06-29 LAB — SPECIMEN STATUS REPORT

## 2018-06-29 LAB — HCV RNA QUANT: Hepatitis C Quantitation: NOT DETECTED IU/mL

## 2018-06-29 NOTE — Progress Notes (Signed)
Reviewed patient's add on labs. HCV quantification is negative but hepatic function panel is significant for hepatitis with elevated protein gap. Possibly related to chronic inflammation. BMP reviewed and without elevated creatinine or calcium. 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 The concern remains for chronic hepatitis B. His HBV core antibody was positive but his IgG was negative indicating this is not an acute infection, this would lead to the thought that his IgG is positive and accounts for the positive HBV core antibody. We will obtain some additional lab work today, including HBV quant, HBV surface antigen, and HIV. If positive he will need a fibroscan then +/- HBV quant levels and results of fibroscan he will need connected with ID for treatment.

## 2018-07-01 ENCOUNTER — Other Ambulatory Visit (INDEPENDENT_AMBULATORY_CARE_PROVIDER_SITE_OTHER): Payer: Self-pay

## 2018-07-01 DIAGNOSIS — Z114 Encounter for screening for human immunodeficiency virus [HIV]: Secondary | ICD-10-CM

## 2018-07-01 DIAGNOSIS — R768 Other specified abnormal immunological findings in serum: Secondary | ICD-10-CM

## 2018-07-02 LAB — HIV ANTIBODY (ROUTINE TESTING W REFLEX): HIV Screen 4th Generation wRfx: NONREACTIVE

## 2018-07-02 LAB — HBV CORE AB, IGG/IGM DIFF
Hep B C IgM: NEGATIVE
Hep B Core Total Ab: POSITIVE — AB

## 2018-07-02 LAB — HEPATITIS B SURFACE ANTIGEN: Hepatitis B Surface Ag: NEGATIVE

## 2018-07-03 LAB — HEPATITIS B DNA, ULTRAQUANTITATIVE, PCR: HBV DNA SERPL PCR-ACNC: NOT DETECTED IU/mL

## 2018-07-06 ENCOUNTER — Other Ambulatory Visit: Payer: Medicaid Other

## 2018-07-31 ENCOUNTER — Ambulatory Visit (HOSPITAL_COMMUNITY)
Admission: RE | Admit: 2018-07-31 | Discharge: 2018-07-31 | Disposition: A | Discharge status: Home / Self Care | Payer: Medicaid Other | Source: Ambulatory Visit | Attending: Internal Medicine | Admitting: Internal Medicine

## 2018-07-31 ENCOUNTER — Ambulatory Visit: Payer: Self-pay | Admitting: Dietician

## 2018-07-31 ENCOUNTER — Ambulatory Visit: Payer: Self-pay | Admitting: Internal Medicine

## 2018-07-31 VITALS — BP 131/85 | HR 73 | Temp 97.7°F | Wt 153.8 lb

## 2018-07-31 DIAGNOSIS — I10 Essential (primary) hypertension: Secondary | ICD-10-CM

## 2018-07-31 DIAGNOSIS — Z79899 Other long term (current) drug therapy: Secondary | ICD-10-CM

## 2018-07-31 DIAGNOSIS — Z0000 Encounter for general adult medical examination without abnormal findings: Secondary | ICD-10-CM

## 2018-07-31 DIAGNOSIS — Z794 Long term (current) use of insulin: Secondary | ICD-10-CM

## 2018-07-31 DIAGNOSIS — R7401 Elevation of levels of liver transaminase levels: Secondary | ICD-10-CM

## 2018-07-31 DIAGNOSIS — R740 Nonspecific elevation of levels of transaminase and lactic acid dehydrogenase [LDH]: Secondary | ICD-10-CM

## 2018-07-31 DIAGNOSIS — E119 Type 2 diabetes mellitus without complications: Secondary | ICD-10-CM

## 2018-07-31 DIAGNOSIS — K5900 Constipation, unspecified: Secondary | ICD-10-CM

## 2018-07-31 LAB — PROTIME-INR
INR: 1.36
Prothrombin Time: 16.7 seconds — ABNORMAL HIGH (ref 11.4–15.2)

## 2018-07-31 MED ORDER — SENNOSIDES-DOCUSATE SODIUM 8.6-50 MG PO TABS: 2.0000 | ORAL_TABLET | Freq: Two times a day (BID) | ORAL | 0 refills | Status: DC

## 2018-07-31 MED ORDER — INSULIN PEN NEEDLE 31G X 5 MM MISC: 2 refills | Status: AC

## 2018-07-31 MED ORDER — POLYETHYLENE GLYCOL 3350 17 G PO PACK: 17.0000 g | PACK | Freq: Every day | ORAL | 0 refills | Status: DC

## 2018-07-31 MED ORDER — INSULIN GLARGINE 100 UNIT/ML SOLOSTAR PEN: 10.0000 [IU] | PEN_INJECTOR | Freq: Every day | SUBCUTANEOUS | 0 refills | Status: DC

## 2018-07-31 MED ORDER — METFORMIN HCL ER 500 MG PO TB24: 1000.0000 mg | ORAL_TABLET | Freq: Two times a day (BID) | ORAL | 2 refills | Status: DC

## 2018-07-31 MED ORDER — LISINOPRIL 2.5 MG PO TABS: 2.5000 mg | ORAL_TABLET | Freq: Every day | ORAL | 1 refills | Status: DC

## 2018-07-31 MED FILL — LANTUS SOLOSTAR 100 UNITS/M: 100 | 30 days supply | Qty: 3 | Fill #0

## 2018-07-31 MED FILL — LISINOPRIL 2.5 MG TABLET: 2.5 | 30 days supply | Qty: 30 | Fill #0

## 2018-07-31 NOTE — Patient Instructions (Signed)
Mr. Garin,   Your x-rays show severe constipation.  For this you will need to eat a lot of fiber in your diet.  I am also prescribing you MiraLAX that you will drink once a day as well as Senokot 2 tablets 2 times a day.  Please follow-up in our acute care clinic on Monday or Tuesday to make sure that the medications are working.  I will give you a call with the results of your blood work.  Please call us if you have any questions or concerns.  - Dr. Frederico Hamman

## 2018-08-01 LAB — CBC WITH DIFFERENTIAL/PLATELET
Basophils Absolute: 0.1 10*3/uL (ref 0.0–0.2)
Basos: 1 %
EOS (ABSOLUTE): 0.1 10*3/uL (ref 0.0–0.4)
Eos: 2 %
HEMOGLOBIN: 10.8 g/dL — AB (ref 13.0–17.7)
Hematocrit: 33.7 % — ABNORMAL LOW (ref 37.5–51.0)
Immature Grans (Abs): 0 10*3/uL (ref 0.0–0.1)
Immature Granulocytes: 0 %
Lymphocytes Absolute: 1.8 10*3/uL (ref 0.7–3.1)
Lymphs: 34 %
MCH: 24.1 pg — AB (ref 26.6–33.0)
MCHC: 32 g/dL (ref 31.5–35.7)
MCV: 75 fL — ABNORMAL LOW (ref 79–97)
MONOCYTES: 8 %
Monocytes Absolute: 0.4 10*3/uL (ref 0.1–0.9)
Neutrophils Absolute: 2.8 10*3/uL (ref 1.4–7.0)
Neutrophils: 55 %
Platelets: 105 10*3/uL — ABNORMAL LOW (ref 150–450)
RBC: 4.49 x10E6/uL (ref 4.14–5.80)
RDW: 16.5 % — ABNORMAL HIGH (ref 11.6–15.4)
WBC: 5.1 10*3/uL (ref 3.4–10.8)

## 2018-08-01 LAB — CMP14 + ANION GAP
ALT: 280 IU/L — ABNORMAL HIGH (ref 0–44)
AST: 276 IU/L — ABNORMAL HIGH (ref 0–40)
Albumin/Globulin Ratio: 0.5 — ABNORMAL LOW (ref 1.2–2.2)
Albumin: 2.7 g/dL — ABNORMAL LOW (ref 3.5–5.5)
Alkaline Phosphatase: 125 IU/L — ABNORMAL HIGH (ref 39–117)
Anion Gap: 11 mmol/L (ref 10.0–18.0)
BUN/Creatinine Ratio: 7 — ABNORMAL LOW (ref 9–20)
BUN: 8 mg/dL (ref 6–24)
Bilirubin Total: 2.3 mg/dL — ABNORMAL HIGH (ref 0.0–1.2)
CO2: 24 mmol/L (ref 20–29)
Calcium: 8.4 mg/dL — ABNORMAL LOW (ref 8.7–10.2)
Chloride: 103 mmol/L (ref 96–106)
Creatinine, Ser: 1.23 mg/dL (ref 0.76–1.27)
GFR calc Af Amer: 74 mL/min/{1.73_m2} (ref >59)
GFR calc non Af Amer: 64 mL/min/{1.73_m2} (ref >59)
Globulin, Total: 5.8 g/dL — ABNORMAL HIGH (ref 1.5–4.5)
Glucose: 175 mg/dL — ABNORMAL HIGH (ref 65–99)
Potassium: 3.9 mmol/L (ref 3.5–5.2)
Sodium: 138 mmol/L (ref 134–144)
Total Protein: 8.5 g/dL (ref 6.0–8.5)

## 2018-08-02 ENCOUNTER — Encounter: Payer: Self-pay | Admitting: Internal Medicine

## 2018-08-02 DIAGNOSIS — K7460 Unspecified cirrhosis of liver: Secondary | ICD-10-CM

## 2018-08-02 DIAGNOSIS — K754 Autoimmune hepatitis: Secondary | ICD-10-CM | POA: Insufficient documentation

## 2018-08-02 NOTE — Assessment & Plan Note (Signed)
Received flu shot today. 

## 2018-08-02 NOTE — Progress Notes (Signed)
   CC: HTN, DM, and trasaminitis follow up, and constipation  HPI:  [name] is a 58 y.o. year-old male with PMH listed below who presents to clinic for HTN, DM, and trasaminitis follow up, and constipation. Please see problem based assessment and plan for further details.   Past Medical History:  Diagnosis Date  . Diabetes mellitus ([place])   . Essential hypertension   . Frequent headaches   . Rotator cuff arthropathy of right shoulder    Review of Systems:   Review of Systems  Constitutional: Negative for fever, malaise/fatigue and weight loss.  Respiratory: Negative for shortness of breath.   Cardiovascular: Negative for chest pain and leg swelling.  Gastrointestinal: Positive for abdominal pain and constipation. Negative for heartburn, nausea and vomiting.    Physical Exam:  Vitals:   07/31/18 1425  BP: 131/85  Pulse: 73  Temp: 97.7 F (36.5 C)  TempSrc: Oral  SpO2: 99%  Weight: 153 lb 12.8 oz (69.8 kg)    General: pleasant male, in pain but in no acute distress  Cardiac: RRR, nl S1/S2, no murmurs, rubs or gallops Abd: abdomen is mildly distended and diffusely tender to palpation, hypoactive bowel sounds, no signs of acute abdomen.  Ext: warm and well perfused, trace peripheral edema bilaterally     Assessment & Plan:   See Encounters Tab for problem based charting.  Patient discussed with Dr. Dareen Piano

## 2018-08-02 NOTE — Assessment & Plan Note (Signed)
Patient previously tested for hepatitis and labs consistent with prior exposure to HepC and HepB but no active infection. Denies alcohol and IV drug use. Denies tylenol use. CT abd/pelvis from 2009 showed fatty infiltration of liver, possible NAFLD. Ordered CMP, INR, and CBC to check liver function. Will also need RUQ [place]. He also has an elevated protein gap. Checking IFE, PE, and FLC.

## 2018-08-02 NOTE — Assessment & Plan Note (Addendum)
Patient seen one month ago for DM follow up. At that time he reported running out of mealtime insulin x 2 months. CBGs were in the 200s. A1c 8.1. He was started on metformin and Lantus 10. He is now on max dose of metformin and reports compliance with Lantus. Does not bring BG meter but records his BG in a notebook, they are between 120-150s. Will repeat A1c in 2 months. Plan to decrease Lantus then.  Unable to perform eye exam as patient had to go for stat imaging.

## 2018-08-02 NOTE — Assessment & Plan Note (Addendum)
Well controlled on low dose lisinopril. Will continue though unclear if this is for HTN vs proteinuria.

## 2018-08-02 NOTE — Assessment & Plan Note (Addendum)
Reports 5 days of diffuse abdominal pain and bloating associated with constipation and inability to pass gas. Denies nausea and vomiting, but endorses poor PO intake. Not on opiates. Low fiber diet at home. On exam, he appears mildly distended and mildly TTP diffusely. He also has hypoactive bowel sounds. STAT KUB showed very large stool burden throughout the colon as well as probable fecalization in small bowel.  Recommended high fiber diet, Miralax daily, and Senokot 2tabs BID. Will also need screening colonoscopy. Follow up early next week in [facility]. Can consider checking TSH though no other symptoms of hypothyroidism.

## 2018-08-03 MED FILL — metFORMIN HCL ER 500 MG TB2: 500 | 30 days supply | Qty: 120 | Fill #1

## 2018-08-04 ENCOUNTER — Encounter: Payer: Self-pay | Admitting: Internal Medicine

## 2018-08-04 ENCOUNTER — Ambulatory Visit (INDEPENDENT_AMBULATORY_CARE_PROVIDER_SITE_OTHER): Payer: Self-pay | Admitting: Internal Medicine

## 2018-08-04 ENCOUNTER — Other Ambulatory Visit: Payer: Self-pay

## 2018-08-04 VITALS — BP 131/92 | HR 61 | Temp 98.2°F | Ht 65.0 in | Wt 153.1 lb

## 2018-08-04 DIAGNOSIS — K921 Melena: Secondary | ICD-10-CM

## 2018-08-04 DIAGNOSIS — D509 Iron deficiency anemia, unspecified: Secondary | ICD-10-CM

## 2018-08-04 DIAGNOSIS — K5900 Constipation, unspecified: Secondary | ICD-10-CM

## 2018-08-04 DIAGNOSIS — R740 Nonspecific elevation of levels of transaminase and lactic acid dehydrogenase [LDH]: Secondary | ICD-10-CM

## 2018-08-04 DIAGNOSIS — R194 Change in bowel habit: Secondary | ICD-10-CM

## 2018-08-04 DIAGNOSIS — I10 Essential (primary) hypertension: Secondary | ICD-10-CM

## 2018-08-04 DIAGNOSIS — E119 Type 2 diabetes mellitus without complications: Secondary | ICD-10-CM

## 2018-08-04 LAB — IFE, PE AND FLC, SERUM
Albumin SerPl Elph-Mcnc: 3 g/dL (ref 2.9–4.4)
Albumin/Glob SerPl: 0.6 — ABNORMAL LOW (ref 0.7–1.7)
Alpha 1: 0.2 g/dL (ref 0.0–0.4)
Alpha2 Glob SerPl Elph-Mcnc: 0.4 g/dL (ref 0.4–1.0)
B-Globulin SerPl Elph-Mcnc: 0.8 g/dL (ref 0.7–1.3)
Gamma Glob SerPl Elph-Mcnc: 4.2 g/dL — ABNORMAL HIGH (ref 0.4–1.8)
Globulin, Total: 5.6 g/dL — ABNORMAL HIGH (ref 2.2–3.9)
IGA/IMMUNOGLOBULIN A, SERUM: 293 mg/dL (ref 90–386)
Ig Kappa Free Light Chain: 116.4 mg/L — ABNORMAL HIGH (ref 3.3–19.4)
Ig Lambda Free Light Chain: 58.2 mg/L — ABNORMAL HIGH (ref 5.7–26.3)
IgG (Immunoglobin G), Serum: 5251 mg/dL — ABNORMAL HIGH (ref 700–1600)
IgM (Immunoglobulin M), Srm: 163 mg/dL (ref 20–172)
KAPPA/LAMBDA FLC RATIO: 2 — AB (ref 0.26–1.65)
Total Protein: 8.6 g/dL — ABNORMAL HIGH (ref 6.0–8.5)

## 2018-08-04 MED ORDER — POLYETHYLENE GLYCOL 3350 17 G PO PACK: 17.0000 g | PACK | Freq: Two times a day (BID) | ORAL | 0 refills | Status: DC

## 2018-08-04 NOTE — Progress Notes (Signed)
Internal Medicine Clinic Attending  Case discussed with Dr. Santos-Sanchez at the time of the visit.  We reviewed the resident's history and exam and pertinent patient test results.  I agree with the assessment, diagnosis, and plan of care documented in the resident's note.    

## 2018-08-04 NOTE — Assessment & Plan Note (Signed)
Patient with new microcytic anemia in setting of new abd pain and constipation.   Plan: --CT abd to assess for mass --referral to GI for scope --f/u Iron studies

## 2018-08-04 NOTE — Progress Notes (Signed)
   CC: constipation  HPI:  [name] is a 58 y.o. with a PMH of HTN, DM, transaminitis presenting to clinic for constipation.  An in person [place] interpreter was used to facilitate this visit.  Patient seen last week for 1wk h/o constipation. KUB revealed significant stool burden with fecalization of small bowel. He was placed on a bowel regimen of miralax daily and BID senna. Today he states he has had mild improvement in his constipation; he was able to have a bowel movement last week that was loose, however has not had a BM at least the last 2 days and has not passed any flatus yet today. He endorses continued abdominal pain, however it is now more localized to the upper abdominal regions compared to diffuse pain last week. He endorses very dark, almost black stools when he does have a BM. He also endorses early satiety due to feeling of bloating, as well some shortness of breath with deep breaths, also attributed to bloating. He denies chest pain, cough, LE swelling.  Patient states that prior to ~3wks ago, he had regular BMs and never had problems with constipation or abdominal pain.  Patient denies prior colonoscopy; he denies family h/o GI issues or any cancers.   Please see problem based Assessment and Plan for status of patients chronic conditions.  Past Medical History:  Diagnosis Date  . Diabetes mellitus ([place])   . Essential hypertension   . Frequent headaches   . Rotator cuff arthropathy of right shoulder     Review of Systems:   Per HPI  Physical Exam:  Vitals:   08/04/18 0844  BP: (!) 131/92  Pulse: 61  Temp: 98.2 F (36.8 C)  TempSrc: Oral  SpO2: 100%  Weight: 153 lb 1.6 oz (69.4 kg)  Height: 5\' 5"  (1.651 m)   GENERAL- alert, co-operative, appears as stated age, not in any distress. CARDIAC- RRR, no murmurs, rubs or gallops. RESP- Moving equal volumes of air, and clear to auscultation bilaterally, no wheezes or crackles. ABDOMEN- Soft, mildly  distended. Mild TTP of LU/RUQs and epigastric region evenly. No rebound, no masses palpated. EXTREMITIES- pulse 2+ PT, symmetric, no pedal edema. SKIN- Warm, dry. PSYCH- Normal mood and affect, appropriate thought content and speech.  Assessment & Plan:   See Encounters Tab for problem based charting.   Patient discussed with Dr. Moshe Cipro, MD Internal Medicine PGY-3

## 2018-08-04 NOTE — Assessment & Plan Note (Signed)
Patient with continued constipation though not presenting with acutely obstructive symptoms. Due to acute onset and severity of symptoms, in setting of age, and new microcytic anemia, there is concern for intra-abdominal malignancy.   Plan: --f/u CT abd --refer to GI for diagnostic colonoscopy --advised to use 1x dose of mag citrate OTC then increase miralax to BID and continue senna BID --f/u in 2 wks --advised to present to ED if symptoms of nausea/vomiting, worsening pain

## 2018-08-04 NOTE — Patient Instructions (Signed)
Please take magnesium citrate (over the counter) once.  Continue taking the miralax (increase to twice a day), and twice daily senna.  We will get more labs on you today. I have ordered different imaging of your abdomen and also placed a referral to the stomach doctors for a colonoscopy.

## 2018-08-05 LAB — IRON AND TIBC
Iron Saturation: 49 % (ref 15–55)
Iron: 110 ug/dL (ref 38–169)
Total Iron Binding Capacity: 225 ug/dL — ABNORMAL LOW (ref 250–450)
UIBC: 115 ug/dL (ref 111–343)

## 2018-08-05 NOTE — Progress Notes (Signed)
Internal Medicine Clinic Attending  Case discussed with Dr. Jari Favre at the time of the visit.  We reviewed the resident's history and exam and pertinent patient test results.  I agree with the assessment, diagnosis, and plan of care documented in the resident's note.  Concerning severe constipation and impressive stool burden on abdominal X-ray, has improved only slightly with miralax daily and senna twice daily. Also has melena and anemia. No obstructive symptoms yet. Needs colonoscopy, will also obtain CTAP to evaluate for compressive mass or metastases that would prompt more urgent workup.  Lenice Pressman, M.D., Ph.D.

## 2018-08-12 ENCOUNTER — Ambulatory Visit: Payer: Medicaid Other

## 2018-08-19 ENCOUNTER — Other Ambulatory Visit: Payer: Self-pay | Admitting: Internal Medicine

## 2018-08-19 DIAGNOSIS — K7460 Unspecified cirrhosis of liver: Secondary | ICD-10-CM

## 2018-08-19 NOTE — Progress Notes (Unsigned)
ruq  

## 2018-08-21 ENCOUNTER — Other Ambulatory Visit: Payer: Self-pay

## 2018-08-21 ENCOUNTER — Telehealth: Payer: Self-pay | Admitting: *Deleted

## 2018-08-21 ENCOUNTER — Ambulatory Visit (INDEPENDENT_AMBULATORY_CARE_PROVIDER_SITE_OTHER): Payer: Self-pay | Admitting: Internal Medicine

## 2018-08-21 ENCOUNTER — Encounter: Payer: Self-pay | Admitting: Internal Medicine

## 2018-08-21 VITALS — BP 128/87 | HR 88 | Temp 98.4°F | Wt 150.3 lb

## 2018-08-21 DIAGNOSIS — K5900 Constipation, unspecified: Secondary | ICD-10-CM

## 2018-08-21 DIAGNOSIS — D509 Iron deficiency anemia, unspecified: Secondary | ICD-10-CM

## 2018-08-21 DIAGNOSIS — Z79899 Other long term (current) drug therapy: Secondary | ICD-10-CM

## 2018-08-21 DIAGNOSIS — Z205 Contact with and (suspected) exposure to viral hepatitis: Secondary | ICD-10-CM

## 2018-08-21 DIAGNOSIS — N182 Chronic kidney disease, stage 2 (mild): Secondary | ICD-10-CM

## 2018-08-21 DIAGNOSIS — R188 Other ascites: Secondary | ICD-10-CM

## 2018-08-21 DIAGNOSIS — K7460 Unspecified cirrhosis of liver: Secondary | ICD-10-CM

## 2018-08-21 DIAGNOSIS — R197 Diarrhea, unspecified: Secondary | ICD-10-CM

## 2018-08-21 DIAGNOSIS — R740 Nonspecific elevation of levels of transaminase and lactic acid dehydrogenase [LDH]: Secondary | ICD-10-CM

## 2018-08-21 DIAGNOSIS — D892 Hypergammaglobulinemia, unspecified: Secondary | ICD-10-CM

## 2018-08-21 MED ORDER — SENNOSIDES-DOCUSATE SODIUM 8.6-50 MG PO TABS: 2.0000 | ORAL_TABLET | Freq: Two times a day (BID) | ORAL | 0 refills | Status: AC

## 2018-08-21 MED ORDER — FUROSEMIDE 20 MG PO TABS: 20.0000 mg | ORAL_TABLET | Freq: Every day | ORAL | 0 refills | Status: DC

## 2018-08-21 MED ORDER — SPIRONOLACTONE 50 MG PO TABS: 50.0000 mg | ORAL_TABLET | Freq: Every day | ORAL | 0 refills | Status: DC

## 2018-08-21 MED ORDER — POLYETHYLENE GLYCOL 3350 17 G PO PACK: 17.0000 g | PACK | Freq: Two times a day (BID) | ORAL | 0 refills | Status: AC

## 2018-08-21 MED FILL — SPIRONOLACTONE 50 MG TABS: 50 | 14 days supply | Qty: 14 | Fill #0

## 2018-08-21 MED FILL — FUROSEMIDE 20 MG TABS: 20 | 15 days supply | Qty: 15 | Fill #0

## 2018-08-21 NOTE — Patient Instructions (Addendum)
Michael Hamilton,   I sent refills for both of your constipation medications. Please continue taking them as you have.   For the swelling in your legs and in your abdomen, we will start 2 medications.   1- Please start taking Lasix 20 mg (1 tablet) every day  2- Please start taking Spironolactone 50 mg (1 tablet) every day   These medications will make you pee and we recommend you take them during the day.  Please STOP taking lisinopril.  This medication interacts with the spironolactone.   Please make sure to go for your ultrasound on 2/4.  Also make sure to bring quested documents for the orange card as this will be very important to refer you to the GI doctor.  Please schedule a follow-up appointment with me in 1 week.  We will need to check blood work then because of all the medication changes today.  These call us if you have any questions or concerns.

## 2018-08-21 NOTE — Telephone Encounter (Signed)
Spoke to pharmacist [place]. Unclear if patient medicaid family planning covers medications, but they will fill the prescriptions sent today. Since he left the pharmacy already, [facility] will call patient to let him know he can pick them up.

## 2018-08-21 NOTE — Telephone Encounter (Signed)
Call from [place] -stated pt has [employer]; they will not be able to fill rxs under the IM Program  also stated they are not contracted with this insurance. Dr Isac Sarna informed. Thanks

## 2018-08-22 ENCOUNTER — Encounter: Payer: Self-pay | Admitting: Internal Medicine

## 2018-08-22 DIAGNOSIS — D892 Hypergammaglobulinemia, unspecified: Secondary | ICD-10-CM | POA: Insufficient documentation

## 2018-08-22 LAB — BETA 2 MICROGLOBULIN, SERUM: Beta-2: 2.8 mg/L — ABNORMAL HIGH (ref 0.6–2.4)

## 2018-08-22 LAB — TSH: TSH: 1.9 u[IU]/mL (ref 0.450–4.500)
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
## 2018-08-22 NOTE — Assessment & Plan Note (Signed)
Advanced liver cirrhosis of unclear etiology. He continues to complain of abdominal distention and reports feeling fluid in his abdomen when he moves. Denies episodes of confusion at home and is alert and fully oriented today. On exam, his abdomen is distended and dull to percussion. No fluid wave appreciated. POCUS showed mild ascites. RUQ ordered but patient unable to schedule it. We scheduled this for him on 2/4.  - Follow up RUQ SU results - Start spironolactone 50 mg QD and Lasix 20 mg QD. Will consider doubling doses at next visit if able to tolerate.  - Will need EGD to screen for varices, but waiting for [facility] Card to coordinate this  - Follow up in 1 week, will need BMP to check K and renal function

## 2018-08-22 NOTE — Assessment & Plan Note (Addendum)
Elevated protein gap of 5.8 noted on most recent blood work 1/10. 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 No M spike. He also has mild renal insufficiency. Calcium levels are normal when corrected for his low albumin.  He is HIV negative. Will need further evaluation. Ordered beta 2 microglobulin and bone survey. Will also need bone marrow biopsy but this will be difficult to obtain without insurance. We will continue to assist patient in this process.

## 2018-08-22 NOTE — Assessment & Plan Note (Signed)
Most recent blood work shows Hgb 10.8 from 13.4 two years ago with an MCV of 75 (this has always been low). He denies hematuria, hematemesis, hemoptysis, hematochezia and melena. DDx includes iron deficiency anemia, AOCD, thalassemia, malignancy.  Iron studies did not demonstrate iron deficiency. Patient is from [place], therefore we should consider thalassemia minor as possible etiology of his anemia. However, given new onset of constipation, will need to rule out malignancy. He was referred to GI for colonoscopy but he is uninsured and unable to be seen at this time. Will continue to monitor.

## 2018-08-22 NOTE — Progress Notes (Signed)
   CC: Follow up of constipation and cirrhosis   HPI:  [name] is a 58 y.o. year-old male with PMH listed below who presents to clinic for follow up of constipation and cirrhosis. Please see problem based assessment and plan for further details.   Past Medical History:  Diagnosis Date  . Diabetes mellitus ([place])   . Essential hypertension   . Frequent headaches   . Rotator cuff arthropathy of right shoulder    Review of Systems:   Review of Systems  Constitutional: Positive for malaise/fatigue. Negative for chills, fever and weight loss.  Respiratory: Positive for shortness of breath. Negative for cough.   Cardiovascular: Positive for leg swelling. Negative for chest pain and orthopnea.  Gastrointestinal: Positive for abdominal pain, constipation and diarrhea. Negative for blood in stool, melena, nausea and vomiting.  Endo/Heme/Allergies: Does not bruise/bleed easily.    Physical Exam:  Vitals:   08/21/18 1356  BP: 128/87  Pulse: 88  Temp: 98.4 F (36.9 C)  TempSrc: Oral  SpO2: 99%  Weight: 150 lb 4.8 oz (68.2 kg)   General: chronically ill appearing male, mildly jaundiced, in no acute distress  Cardiac: regular rate and rhythm, nl S1/S2, no murmurs, rubs or gallops Pulm: CTAB, no wheezes or crackles, no increased work of breathing on room air  Abd: soft and distended, dull to percussion, hypoactive bowel sounds  Neuro: A&Ox3, no focal deficits on exam  Ext: 1+ pitting edema bilaterally    Assessment & Plan:   See Encounters Tab for problem based charting.  Patient discussed with Dr. Beryle Beams

## 2018-08-23 NOTE — Progress Notes (Signed)
Medicine attending: Medical history, presenting problems, physical findings, and medications, reviewed with resident physician Dr Isac Sarna on the day of the patient visit and I concur with her evaluation and management plan. Evaluation of anemia reveals an IgG paraprotein level of over 5 grams but polyclonal pattern on IFE. He has known previous exposure to both hepatitis B and C; HIV negative. Normal renal function. Total IgG is higher than I would expect with a reactive process. He is agreeable to have an elective bone marrow bx for further evaluation.

## 2018-08-25 ENCOUNTER — Ambulatory Visit (HOSPITAL_COMMUNITY)
Admission: RE | Admit: 2018-08-25 | Discharge: 2018-08-25 | Disposition: A | Discharge status: Home / Self Care | Payer: Medicaid Other | Source: Ambulatory Visit | Attending: Internal Medicine | Admitting: Internal Medicine

## 2018-08-25 DIAGNOSIS — K7460 Unspecified cirrhosis of liver: Secondary | ICD-10-CM | POA: Insufficient documentation

## 2018-08-28 ENCOUNTER — Other Ambulatory Visit: Payer: Self-pay

## 2018-08-28 ENCOUNTER — Ambulatory Visit (HOSPITAL_COMMUNITY): Payer: Medicaid Other

## 2018-08-28 ENCOUNTER — Ambulatory Visit (INDEPENDENT_AMBULATORY_CARE_PROVIDER_SITE_OTHER): Payer: Self-pay | Admitting: Internal Medicine

## 2018-08-28 VITALS — BP 111/76 | HR 81 | Temp 98.8°F | Ht 65.5 in | Wt 139.3 lb

## 2018-08-28 DIAGNOSIS — D892 Hypergammaglobulinemia, unspecified: Secondary | ICD-10-CM

## 2018-08-28 DIAGNOSIS — R740 Nonspecific elevation of levels of transaminase and lactic acid dehydrogenase [LDH]: Secondary | ICD-10-CM

## 2018-08-28 DIAGNOSIS — R7401 Elevation of levels of liver transaminase levels: Secondary | ICD-10-CM

## 2018-08-28 MED ORDER — FUROSEMIDE 20 MG PO TABS: 20.0000 mg | ORAL_TABLET | Freq: Every day | ORAL | 0 refills | Status: DC

## 2018-08-28 MED ORDER — SPIRONOLACTONE 50 MG PO TABS: 50.0000 mg | ORAL_TABLET | Freq: Every day | ORAL | 0 refills | Status: DC

## 2018-08-28 MED FILL — FUROSEMIDE 20 MG TABS: 20 | 30 days supply | Qty: 30 | Fill #0

## 2018-08-28 MED FILL — SPIRONOLACTONE 50 MG TABLET: 50 | 30 days supply | Qty: 30 | Fill #0

## 2018-08-28 NOTE — Progress Notes (Signed)
Internal Medicine Clinic Attending  Case discussed with Dr. Santos-Sanchez at the time of the visit.  We reviewed the resident's history and exam and pertinent patient test results.  I agree with the assessment, diagnosis, and plan of care documented in the resident's note.    

## 2018-08-28 NOTE — Progress Notes (Signed)
   CC: Cirrhosis follow up  HPI:  Mr.Fotios Spanos is a 58 y.o. year-old male with PMH listed below who presents to clinic for liver cirrhosis follow up. Please see problem based assessment and plan for further details.   Past Medical History:  Diagnosis Date  . Diabetes mellitus ([place])   . Essential hypertension   . Frequent headaches   . Rotator cuff arthropathy of right shoulder    Review of Systems:   Review of Systems  Constitutional: Positive for malaise/fatigue and weight loss. Negative for chills and fever.  Gastrointestinal: Negative for abdominal pain, blood in stool, constipation, melena, nausea and vomiting.  Neurological: Negative for dizziness and headaches.    Physical Exam: Vitals:   08/28/18 0916  BP: 111/76  Pulse: 81  Temp: 98.8 F (37.1 C)  TempSrc: Oral  SpO2: 100%  Weight: 139 lb 4.8 oz (63.2 kg)  Height: 5' 5.5" (1.664 m)   General: chronically-ill appearing male in NAD  Cardiac: regular rate and rhythm, nl S1/S2, no murmurs, rubs or gallops, no JVD  Pulm: CTAB, no wheezes or crackles, no increased work of breathing on room air  Abd: soft, NTND, bowel sounds present, no fluid wave noted  Neuro: no asterixis  Ext: warm and well perfused, no peripheral edema     Office Visit from 08/28/2018 in [place]  PHQ-9 Total Score  6      Assessment & Plan:   See Encounters Tab for problem based charting.  Patient discussed with Dr. Lynnae January

## 2018-08-28 NOTE — Assessment & Plan Note (Signed)
Patient was found to have an abnormally elevated protein gap of 5.8 on 1/10. 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 Most recently beta 2 microglobulin was noted to be elevated as well at 2.8. All these findings are concerning for multiple myeloma and he will need further evaluation with bone marrow biopsy and bone survey. Unfortunately, this has been complicated by patient's uninsured status. He is currently waiting for his [employer] and [employer]. We will call the person working on this to expedite the process given urgency of bone marrow biopsy. He has turned in all his documents and is currently awaiting for both approval and the letter. Will continue to follow.

## 2018-08-28 NOTE — Patient Instructions (Signed)
Mr. Jedlicka,   We will call the office in charge of the [facility] and [place] letter to put a rush on your card in order to get the bone marrow biopsy. We will give you a call when all of this is ready.   I sent refills for both Lasix and spironolactone. Continue taking them as usual.   Please let us know if you have any questions or concerns.   - Dr. Frederico Hamman

## 2018-08-28 NOTE — Assessment & Plan Note (Addendum)
Michael Hamilton presents for follow up of liver cirrhosis of unknown etiology (concern for multiple myeloma, see paraproteinemia A&P). He was started on Spironolactone 50 mg QD and Lasix 20 mg 1 week ago and reports feeling better. His abdominal distention and LE edema have improved and he has lost 10 lbs since his last visit. Blood pressure remains stable.  - Continue spironolactone and Lasix at current doses  - Will need further evaluation for multiple myeloma  - Follow up CMP

## 2018-08-29 LAB — CMP14 + ANION GAP
ALT: 134 IU/L — ABNORMAL HIGH (ref 0–44)
AST: 153 IU/L — AB (ref 0–40)
Albumin/Globulin Ratio: 0.6 — ABNORMAL LOW (ref 1.2–2.2)
Albumin: 3.5 g/dL — ABNORMAL LOW (ref 3.8–4.9)
Alkaline Phosphatase: 124 IU/L — ABNORMAL HIGH (ref 39–117)
Anion Gap: 14 mmol/L (ref 10.0–18.0)
BUN/Creatinine Ratio: 8 — ABNORMAL LOW (ref 9–20)
BUN: 8 mg/dL (ref 6–24)
Bilirubin Total: 2.7 mg/dL — ABNORMAL HIGH (ref 0.0–1.2)
CO2: 22 mmol/L (ref 20–29)
CREATININE: 1.03 mg/dL (ref 0.76–1.27)
Calcium: 9.1 mg/dL (ref 8.7–10.2)
Chloride: 99 mmol/L (ref 96–106)
GFR calc Af Amer: 91 mL/min/{1.73_m2} (ref >59)
GFR calc non Af Amer: 79 mL/min/{1.73_m2} (ref >59)
GLOBULIN, TOTAL: 6.2 g/dL — AB (ref 1.5–4.5)
Glucose: 106 mg/dL — ABNORMAL HIGH (ref 65–99)
Potassium: 4.2 mmol/L (ref 3.5–5.2)
Sodium: 135 mmol/L (ref 134–144)
Total Protein: 9.7 g/dL — ABNORMAL HIGH (ref 6.0–8.5)

## 2018-09-02 ENCOUNTER — Telehealth: Payer: Self-pay | Admitting: Endocrinology

## 2018-09-02 ENCOUNTER — Ambulatory Visit: Payer: Medicaid Other | Admitting: Endocrinology

## 2018-09-02 NOTE — Telephone Encounter (Signed)
Please schedule f/u appt for next available appointment  

## 2018-09-02 NOTE — Telephone Encounter (Signed)
Patient stated throogh interpreter that he now has a family Doctor and will no longer be seeing Dr. Loanne Drilling. Patient thanks Dr. Loanne Drilling for his care.

## 2018-09-02 NOTE — Telephone Encounter (Signed)
Patient no showed today's appt. Please advise on how to follow up. °A. No follow up necessary. °B. Follow up urgent. Contact patient immediately. °C. Follow up necessary. Contact patient and schedule visit in ___ days. °D. Follow up advised. Contact patient and schedule visit in ____weeks. ° °Would you like the NS fee to be applied to this visit? ° °

## 2018-09-11 ENCOUNTER — Ambulatory Visit: Payer: Medicaid Other

## 2018-09-15 ENCOUNTER — Other Ambulatory Visit: Payer: Self-pay | Admitting: Student in an Organized Health Care Education/Training Program

## 2018-09-15 ENCOUNTER — Ambulatory Visit (INDEPENDENT_AMBULATORY_CARE_PROVIDER_SITE_OTHER): Payer: Self-pay | Admitting: Internal Medicine

## 2018-09-15 ENCOUNTER — Other Ambulatory Visit: Payer: Self-pay

## 2018-09-15 VITALS — BP 120/79 | HR 65 | Temp 97.9°F | Ht 65.5 in | Wt 139.4 lb

## 2018-09-15 DIAGNOSIS — D892 Hypergammaglobulinemia, unspecified: Secondary | ICD-10-CM

## 2018-09-15 DIAGNOSIS — E119 Type 2 diabetes mellitus without complications: Secondary | ICD-10-CM

## 2018-09-15 DIAGNOSIS — Z79899 Other long term (current) drug therapy: Secondary | ICD-10-CM

## 2018-09-15 DIAGNOSIS — R188 Other ascites: Secondary | ICD-10-CM

## 2018-09-15 DIAGNOSIS — K7460 Unspecified cirrhosis of liver: Secondary | ICD-10-CM

## 2018-09-15 DIAGNOSIS — Z794 Long term (current) use of insulin: Secondary | ICD-10-CM

## 2018-09-15 LAB — POCT GLYCOSYLATED HEMOGLOBIN (HGB A1C): Hemoglobin A1C: 4.8 % (ref 4.0–5.6)

## 2018-09-15 LAB — GLUCOSE, CAPILLARY: Glucose-Capillary: 104 mg/dL — ABNORMAL HIGH (ref 70–99)

## 2018-09-15 MED ORDER — SPIRONOLACTONE 50 MG PO TABS: 50.0000 mg | ORAL_TABLET | Freq: Every day | ORAL | 6 refills | Status: DC

## 2018-09-15 MED ORDER — FUROSEMIDE 20 MG PO TABS: 20.0000 mg | ORAL_TABLET | Freq: Every day | ORAL | 6 refills | Status: DC

## 2018-09-15 MED FILL — SPIRONOLACTONE 50 MG TABLET: 50 | 30 days supply | Qty: 30 | Fill #0

## 2018-09-15 MED FILL — FUROSEMIDE 20 MG TABS: 20 | 30 days supply | Qty: 30 | Fill #0

## 2018-09-15 NOTE — Patient Instructions (Addendum)
Mr. Michael Hamilton,   We need to determine the cause of the cirrhosis in your liver.  For this you will need x-rays of some of your bones, and a bone marrow biopsy.  The radiology department with a will give you a call to schedule the bone marrow biopsy.  Make sure to bring the CAFA letter with you.   Continue taking spironolactone and Lasix as usual.  I will refer you to the gastroenterologist who is a specialist on the liver.  You will get a call to set up this appointment.  Please decrease Lantus to 5 units at bedtime.    Make a follow-up appointment with me in 4 weeks.  Call us if you have any questions or concerns.  -Dr. Santos 

## 2018-09-16 ENCOUNTER — Encounter: Payer: Self-pay | Admitting: Internal Medicine

## 2018-09-16 LAB — CMP14 + ANION GAP
ALT: 119 IU/L — ABNORMAL HIGH (ref 0–44)
AST: 126 IU/L — ABNORMAL HIGH (ref 0–40)
Albumin/Globulin Ratio: 0.5 — ABNORMAL LOW (ref 1.2–2.2)
Albumin: 3.3 g/dL — ABNORMAL LOW (ref 3.8–4.9)
Alkaline Phosphatase: 111 IU/L (ref 39–117)
Anion Gap: 16 mmol/L (ref 10.0–18.0)
BUN/Creatinine Ratio: 11 (ref 9–20)
BUN: 10 mg/dL (ref 6–24)
Bilirubin Total: 2.1 mg/dL — ABNORMAL HIGH (ref 0.0–1.2)
CO2: 20 mmol/L (ref 20–29)
Calcium: 9.2 mg/dL (ref 8.7–10.2)
Chloride: 103 mmol/L (ref 96–106)
Creatinine, Ser: 0.9 mg/dL (ref 0.76–1.27)
GFR calc Af Amer: 108 mL/min/{1.73_m2} (ref >59)
GFR calc non Af Amer: 93 mL/min/{1.73_m2} (ref >59)
Globulin, Total: 6.1 g/dL — ABNORMAL HIGH (ref 1.5–4.5)
Glucose: 103 mg/dL — ABNORMAL HIGH (ref 65–99)
Potassium: 4.3 mmol/L (ref 3.5–5.2)
Sodium: 139 mmol/L (ref 134–144)
Total Protein: 9.4 g/dL — ABNORMAL HIGH (ref 6.0–8.5)

## 2018-09-16 LAB — ANTI-SMOOTH MUSCLE ANTIBODY, IGG: Smooth Muscle Ab: 172 Units — ABNORMAL HIGH (ref 0–19)

## 2018-09-16 NOTE — Progress Notes (Signed)
Internal Medicine Clinic Attending  Case discussed with Dr. Santos-Sanchez at the time of the visit.  We reviewed the resident's history and exam and pertinent patient test results.  I agree with the assessment, diagnosis, and plan of care documented in the resident's note.    

## 2018-09-16 NOTE — Progress Notes (Signed)
   CC: Liver cirrhosis follow up  HPI:  [name] is a 58 y.o. year-old male with PMH listed below who presents to clinic for follow up liver cirrhosis. Please see problem based assessment and plan for further details.   Past Medical History:  Diagnosis Date  . Diabetes mellitus ([place])   . Essential hypertension   . Frequent headaches   . Rotator cuff arthropathy of right shoulder    Review of Systems:   Review of Systems  Constitutional: Negative for chills, fever and malaise/fatigue.  Gastrointestinal: Negative for abdominal pain and constipation.  Neurological: Negative for dizziness and headaches.    Physical Exam:  Vitals:   09/15/18 0918  BP: 120/79  Pulse: 65  Temp: 97.9 F (36.6 C)  TempSrc: Oral  SpO2: 100%  Weight: 139 lb 6.4 oz (63.2 kg)  Height: 5' 5.5" (1.664 m)   General: patient is jaundiced, pleasant, in no acute distress  Cardiac: regular rate and rhythm, nl S1/S2, no murmurs, rubs or gallops Pulm: CTAB, no wheezes or crackles, no increased work of breathing on room air  Abd: soft, NTND, bowel sounds are normoactive  Ext: warm and well perfused, no peripheral edema  Assessment & Plan:   See Encounters Tab for problem based charting.  Patient discussed with Dr. Evette Doffing

## 2018-09-16 NOTE — Assessment & Plan Note (Signed)
Patient now has [employer]. Ordered CT-guided bone marrow biopsy and bone survey for further evaluation. Will follow.

## 2018-09-16 NOTE — Assessment & Plan Note (Signed)
Patient has history of liver cirrhosis of unknown etiology complicated by mild ascites (see previous A&P for further details). No history of hepatic encephalopathy. He was started on spironolactone 50 mg and Lasix 20 mg QD one month ago and has had a good response. He is not experiencing side effects from this medications. Has not established with GI due to lack of insurance, but did receive his [employer] and [employer] recently.  - GI referral for screening EGD for esophageal varices and transplant evaluation  - Anti-smooth muscle Ab to assess for autoimmune hepatitis and multiple myeloma workup  - Continue spironolactone and Lasix at current doses

## 2018-09-16 NOTE — Assessment & Plan Note (Addendum)
Patient is on maximum dose of metformin and Lantus 10 units QHS. He is complaint. Did not bring his meter for download but reports his BG is usually between 70s-110s. He also reports 2-3 hypoglycemic episodes (tremulosness and dizziness that resolves after drinking sugary drink). A1c today is 4.8.  - Decrease Lantus to 5U QHS, will call patient in 2 weeks to review BGs  - Discontinue metformin given risk of lactic acidosis in the setting of liver cirrhosis

## 2018-09-25 ENCOUNTER — Other Ambulatory Visit: Payer: Self-pay | Admitting: Student

## 2018-09-25 ENCOUNTER — Other Ambulatory Visit: Payer: Self-pay | Admitting: Radiology

## 2018-09-28 ENCOUNTER — Ambulatory Visit (HOSPITAL_COMMUNITY): Admission: RE | Admit: 2018-09-28 | Payer: Medicaid Other | Source: Ambulatory Visit

## 2018-09-28 ENCOUNTER — Ambulatory Visit (HOSPITAL_COMMUNITY): Payer: Medicaid Other

## 2018-10-06 ENCOUNTER — Encounter (INDEPENDENT_AMBULATORY_CARE_PROVIDER_SITE_OTHER): Payer: Self-pay

## 2018-10-06 ENCOUNTER — Ambulatory Visit (INDEPENDENT_AMBULATORY_CARE_PROVIDER_SITE_OTHER): Payer: Self-pay | Admitting: Gastroenterology

## 2018-10-06 ENCOUNTER — Other Ambulatory Visit: Payer: Self-pay

## 2018-10-06 ENCOUNTER — Encounter: Payer: Self-pay | Admitting: Gastroenterology

## 2018-10-06 ENCOUNTER — Other Ambulatory Visit (INDEPENDENT_AMBULATORY_CARE_PROVIDER_SITE_OTHER): Payer: Self-pay

## 2018-10-06 VITALS — BP 90/54 | HR 80 | Temp 98.0°F | Ht 65.0 in | Wt 143.0 lb

## 2018-10-06 DIAGNOSIS — K7460 Unspecified cirrhosis of liver: Secondary | ICD-10-CM

## 2018-10-06 DIAGNOSIS — R188 Other ascites: Secondary | ICD-10-CM

## 2018-10-06 DIAGNOSIS — D649 Anemia, unspecified: Secondary | ICD-10-CM

## 2018-10-06 LAB — CBC WITH DIFFERENTIAL/PLATELET
Basophils Absolute: 0.1 10*3/uL (ref 0.0–0.1)
Basophils Relative: 1.3 % (ref 0.0–3.0)
Eosinophils Absolute: 0.1 10*3/uL (ref 0.0–0.7)
Eosinophils Relative: 1.3 % (ref 0.0–5.0)
HCT: 33.3 % — ABNORMAL LOW (ref 39.0–52.0)
HEMOGLOBIN: 10.8 g/dL — AB (ref 13.0–17.0)
Lymphocytes Relative: 35.7 % (ref 12.0–46.0)
Lymphs Abs: 2.1 10*3/uL (ref 0.7–4.0)
MCHC: 32.5 g/dL (ref 30.0–36.0)
MCV: 78.5 fl (ref 78.0–100.0)
Monocytes Absolute: 0.4 10*3/uL (ref 0.1–1.0)
Monocytes Relative: 6.5 % (ref 3.0–12.0)
Neutro Abs: 3.2 10*3/uL (ref 1.4–7.7)
Neutrophils Relative %: 55.2 % (ref 43.0–77.0)
Platelets: 100 10*3/uL — ABNORMAL LOW (ref 150.0–400.0)
RBC: 4.25 Mil/uL (ref 4.22–5.81)
RDW: 16.4 % — ABNORMAL HIGH (ref 11.5–15.5)
WBC: 5.8 10*3/uL (ref 4.0–10.5)

## 2018-10-06 LAB — COMPREHENSIVE METABOLIC PANEL
ALT: 129 U/L — ABNORMAL HIGH (ref 0–53)
AST: 104 U/L — ABNORMAL HIGH (ref 0–37)
Albumin: 3.3 g/dL — ABNORMAL LOW (ref 3.5–5.2)
Alkaline Phosphatase: 105 U/L (ref 39–117)
BUN: 11 mg/dL (ref 6–23)
CO2: 28 meq/L (ref 19–32)
Calcium: 9.3 mg/dL (ref 8.4–10.5)
Chloride: 100 mEq/L (ref 96–112)
Creatinine, Ser: 1.05 mg/dL (ref 0.40–1.50)
GFR: 72.24 mL/min (ref >60.00)
Glucose, Bld: 335 mg/dL — ABNORMAL HIGH (ref 70–99)
Potassium: 4 mEq/L (ref 3.5–5.1)
Sodium: 131 mEq/L — ABNORMAL LOW (ref 135–145)
Total Bilirubin: 1.9 mg/dL — ABNORMAL HIGH (ref 0.2–1.2)
Total Protein: 9.5 g/dL — ABNORMAL HIGH (ref 6.0–8.3)

## 2018-10-06 LAB — PROTIME-INR
INR: 1.5 ratio — ABNORMAL HIGH (ref 0.8–1.0)
Prothrombin Time: 17.8 s — ABNORMAL HIGH (ref 9.6–13.1)

## 2018-10-06 MED ORDER — FUROSEMIDE 20 MG PO TABS: 20.0000 mg | ORAL_TABLET | Freq: Every day | ORAL | 1 refills | Status: AC

## 2018-10-06 MED ORDER — SPIRONOLACTONE 50 MG PO TABS: 50.0000 mg | ORAL_TABLET | Freq: Every day | ORAL | 1 refills | Status: AC

## 2018-10-06 MED FILL — FUROSEMIDE 20 MG TABS: 20 | 30 days supply | Qty: 30 | Fill #0 | Status: TO

## 2018-10-06 MED FILL — SPIRONOLACTONE 50 MG TABLET: 50 | 30 days supply | Qty: 30 | Fill #1 | Status: TO

## 2018-10-06 NOTE — Patient Instructions (Addendum)
To help prevent the possible spread of infection to our patients, communities, and staff; we will be implementing the following measures:  Please only allow one visitor/family member to accompany you to any upcoming appointments with [facility]. If you have any concerns about this please contact our office to discuss prior to the appointment.   Please go to the lab in the basement of our building to have lab work done as you leave today. Hit "B" for basement when you get on the elevator.  When the doors open the lab is on your left.  We will call you with the results. Thank you.  You have been scheduled for an endoscopy and colonoscopy. Please follow the written instructions given to you at your visit today. Please pick up your prep supplies at the pharmacy within the next 1-3 days. If you use inhalers (even only as needed), please bring them with you on the day of your procedure. Your physician has requested that you go to www.startemmi.com and enter the access code given to you at your visit today. This web site gives a general overview about your procedure. However, you should still follow specific instructions given to you by our office regarding your preparation for the procedure.  If you are age 49 or older, your body mass index should be between 23-30. Your Body mass index is 23.8 kg/m. If this is out of the aforementioned range listed, please consider follow up with your Primary Care Provider.  If you are age 28 or younger, your body mass index should be between 19-25. Your Body mass index is 23.8 kg/m. If this is out of the aformentioned range listed, please consider follow up with your Primary Care Provider.   We have sent the following medications to your pharmacy for you to pick up at your convenience: Aldactone 50mg : Once daily Lasix 20mg : Once daily   Thank you for entrusting me with your care and for choosing [facility], Dr. Cullowhee Cellar

## 2018-10-06 NOTE — Progress Notes (Signed)
HPI :  58 y/o [name] with a history of DM, HTN, headaches, referred by Michael Roche MD for anemia and possible cirrhosis.  The patient is accompanied by his daughter today as well as a Optometrist. He denies any known history of liver disease that he is aware of. His daughter states he may have had viral hepatitis in the past but is not sure. He was found to have ascites and LE edema, had elevated liver enzymes on labs and had a workup done by his primary care to include labs and ultrasound. Results of these are as outlined below:  Hepatitis B core antibody (+) Hepatitis B surface antibody (-) Hepatitis B surface antigen (-) Hepatitis B DNA (-) Hepatitis C antibody (+) Hepatitis C RNA (-) HIV (-) Smooth muscle antibody - (+) 172 IgG 5,251 Iron 110, iron sat 49%,  Total protein as high as 9.1 Hgb 10.8, MCV 75, plt 105, WBC 5.1 Albumin 2.7,  T bil 2.3, AP 125, AST 276, ALT 280 - Feb 25th (in December ALT 426, AST 348) INR 1.36 No LFTs noted prior to December  [place] 08/25/18 - cirrhosis with ascites, no focal abnormality  He was placed on Aldactone 50 mg once daily as well as Lasix 20 mg once a day. He states he is tolerating this quite well and that the edema in his legs and abdomen has resolved. He states his energy seems a bit better lately but he has lost a significant amount of weight over the past few years, as well as come down from 190s to 120s. He denies any alcohol use at all. He denies any blood in his stools. His stools are dark brown. He denies any change in bowel habits in general. He denies any abdominal pains. He is eating well. He denies any NSAID use routinely. He uses Tylenol as needed for aches and pains. He is from [place]. He endorses being told he had cirrhosis of liver but had several questions about this until was causing it. He has never had an EGD and has never had a prior colonoscopy. No history of jaundice or encephalopathy. No known family history of  liver diseases.   Past Medical History:  Diagnosis Date   Anemia    Cirrhosis ([place])    with ascites   Diabetes mellitus ([place])    Essential hypertension    Frequent headaches    Rotator cuff arthropathy of right shoulder      Past Surgical History:  Procedure Laterality Date   SHOULDER ARTHROSCOPY W/ ROTATOR CUFF REPAIR Right    Family History  Problem Relation Age of Onset   Diabetes Neg Hx    Colon cancer Neg Hx    Esophageal cancer Neg Hx    Social History   Tobacco Use   Smoking status: Former Smoker    Years: 2.00   Smokeless tobacco: Never Used   Tobacco comment: quit when he was 74  Substance Use Topics   Alcohol use: No   Drug use: No   Current Outpatient Medications  Medication Sig Dispense Refill   furosemide (LASIX) 20 MG tablet Take 1 tablet (20 mg total) by mouth daily. 90 tablet 1   glucose blood (ONETOUCH VERIO) test strip Test blood sugars as directed. Dx: E11.9 100 each 12   Insulin Pen Needle 31G X 5 MM MISC Use 1 time daily with injections 180 each 2   metFORMIN (GLUCOPHAGE-XR) 500 MG 24 hr tablet Take 2 tablets (1,000 mg total) by  mouth 2 (two) times daily. 240 tablet 2   ONE TOUCH LANCETS MISC Test blood sugars as directed. Dx: E11.9 100 each 3   polyethylene glycol (MIRALAX) packet Take 17 g by mouth 2 (two) times daily. 28 each 0   senna-docusate (SENOKOT-S) 8.6-50 MG tablet Take 2 tablets by mouth 2 (two) times daily. 40 tablet 0   spironolactone (ALDACTONE) 50 MG tablet Take 1 tablet (50 mg total) by mouth daily. 90 tablet 1   Insulin Glargine (LANTUS SOLOSTAR) 100 UNIT/ML Solostar Pen Inject 10 Units into the skin daily at 10 pm. (Patient not taking: Reported on 10/06/2018) 15 mL 0   No current facility-administered medications for this visit.    No Known Allergies   Review of Systems: All systems reviewed and negative except where noted in HPI.   Lab Results  Component Value Date   WBC 5.1 07/31/2018   HGB 10.8  (L) 07/31/2018   HCT 33.7 (L) 07/31/2018   MCV 75 (L) 07/31/2018   PLT 105 (L) 07/31/2018    Lab Results  Component Value Date   INR 1.36 07/31/2018    Lab Results  Component Value Date   ALT 119 (H) 09/15/2018   AST 126 (H) 09/15/2018   ALKPHOS 111 09/15/2018   BILITOT 2.1 (H) 09/15/2018    Lab Results  Component Value Date   CREATININE 0.90 09/15/2018   BUN 10 09/15/2018   NA 139 09/15/2018   K 4.3 09/15/2018   CL 103 09/15/2018   CO2 20 09/15/2018     Physical Exam: BP (!) 90/54    Pulse 80    Temp 98 F (36.7 C)    Ht 5\' 5"  (1.651 m)    Wt 143 lb (64.9 kg)    BMI 23.80 kg/m  Constitutional: Pleasant, male in no acute distress. HEENT: Normocephalic and atraumatic. Conjunctivae are normal. No scleral icterus. Neck supple.  Cardiovascular: Normal rate, regular rhythm.  Pulmonary/chest: Effort normal and breath sounds normal. No wheezing, rales or rhonchi. Abdominal: Soft, mildly distended but no fluid wave, nontender. There are no masses palpable. No hepatomegaly. Extremities: no edema Lymphadenopathy: No cervical adenopathy noted. Neurological: Alert and oriented to person place and time. No asterixis Skin: Skin is warm and dry. No rashes noted. Psychiatric: Normal mood and affect. Behavior is normal.   ASSESSMENT AND PLAN: 58 year old male here for new patient consultation regarding the following:  Cirrhosis with ascites / Anemia - patient with newly diagnosed cirrhosis based on imaging and labs. His workup is as outlined above. Interestingly he has positive antibodies for both hepatitis B and C but viral loads are both negative. Sounds like he probably had hepatitis B in the past and cleared on its own, perhaps hepatitis C antibody is a false positive. With his elevated total protein, IgG level, and smooth muscle antibody, this is highly suggestive for autoimmune hepatitis and we discussed this at length. I discussed cirrhosis with them, he has decompensation  with ascites, I discussed risk for further decompensation and HCC. He is in need of current labs to ensure his renal function is tolerating diuretics, repeat LFTs, INR. I will send an ANA, check for his immunity to hepatitis A, obtain a ferritin, AFP level, alpha-1 antitrypsin. Ultimately based on his labs thus far I think he warrants a liver biopsy to confirm the suspected diagnosis of autoimmune hepatitis, will refer to IR for this. Given his ascites, which is not apparent on exam today, this has been improved with diuretics, he  may warrant transjugular approach if ascites persists, defer this decision to interventional radiology. Moving forward, given his anemia and newly diagnosed cirrhosis he warrants an upper endoscopy and a colonoscopy. I discussed risks and benefits of endoscopy and anesthesia with him and he wanted to proceed. If he does have autoimmune hepatitis he will warrant immunosuppressive therapy, he may also warrant suppressive therapy for his history of hepatitis B core antibody. I will likely refer him to hepatology given the complexity of his case, regarding long-term management pending findings. He should continue to avoid alcohol.  History obtained through translator and the patient's daughter. The point of contact with the patient's daughter, named Michael [place] - phone is [phone]. I answered all of their questions.   Whitney Cellar, MD [facility]  CC: Dr. Welford [place]

## 2018-10-07 LAB — FERRITIN: Ferritin: 256.3 ng/mL (ref 22.0–322.0)

## 2018-10-07 MED FILL — metFORMIN HCL ER 500 MG TB2: 500 | 30 days supply | Qty: 120 | Fill #2 | Status: TO

## 2018-10-07 MED FILL — LANTUS SOLOSTAR 100 UNITS/M: 100 | 30 days supply | Qty: 3 | Fill #1 | Status: TO

## 2018-10-08 LAB — AFP TUMOR MARKER: AFP-Tumor Marker: 10.2 ng/mL — ABNORMAL HIGH (ref <6.1)

## 2018-10-08 LAB — HEPATITIS A ANTIBODY, TOTAL: Hepatitis A AB,Total: REACTIVE — AB

## 2018-10-08 LAB — ANA: Anti Nuclear Antibody (ANA): NEGATIVE

## 2018-10-08 LAB — MITOCHONDRIAL ANTIBODIES: Mitochondrial M2 Ab, IgG: <=20 U

## 2018-10-08 LAB — ALPHA-1-ANTITRYPSIN: A-1 Antitrypsin, Ser: 138 mg/dL (ref 83–199)

## 2018-10-16 ENCOUNTER — Telehealth: Payer: Self-pay | Admitting: Internal Medicine

## 2018-10-16 ENCOUNTER — Other Ambulatory Visit: Payer: Self-pay

## 2018-10-16 ENCOUNTER — Encounter: Payer: Medicaid Other | Admitting: Internal Medicine

## 2018-10-16 NOTE — Telephone Encounter (Addendum)
Patient scheduled for follow up today. Unfortunately, there was not a [place] interpreter available and I was not able to speak with patient. I then called patient's daughter, Michael [place]. She was not with patient, but reported he has been doing well and does not have acute complaints. He established with GI 10 days ago with Dr.Ambruster who is planning a liver biopsy to confirm diagnosis of autoimmune hepatitis as etiology of patient's liver cirrhosis as well as EGD to check for varices and screening colonoscopy.  For unclear reasons, he stopped using insulin (I decreased Lantus 10->5 during previous visit) and CMP obtained at GI visit showed BG 335. He was instructed to resume insulin which he has done. BGs have been 120-150 since resuming insulin.   Daughter stated patient does not need refills at this time. Advised her to call with any questions or concerns.   [name], MD  Internal Medicine PGY-2  P [phone]

## 2018-10-28 ENCOUNTER — Encounter: Payer: Medicaid Other | Admitting: Gastroenterology

## 2018-11-04 ENCOUNTER — Telehealth: Payer: Self-pay | Admitting: *Deleted

## 2018-11-04 NOTE — Telephone Encounter (Signed)
Attempted to reach for covid screening call.  Not available and not able to leave message

## 2018-11-04 NOTE — Telephone Encounter (Signed)
Covid-19 travel screening questions  Have you traveled in the last 14 days?no If yes where?  Do you now or have you had a fever in the last 14 days? no  Do you have any respiratory symptoms of shortness of breath or cough now or in the last 14 days? No  Do you have any family members or close contacts with diagnosed or suspected Covid-19? no  Spoke with daughter, who will be with pt to help interpret tomorrow.  They speak [place].

## 2018-11-05 ENCOUNTER — Other Ambulatory Visit: Payer: Self-pay | Admitting: Gastroenterology

## 2018-11-05 ENCOUNTER — Ambulatory Visit (AMBULATORY_SURGERY_CENTER): Payer: Self-pay | Admitting: Gastroenterology

## 2018-11-05 ENCOUNTER — Encounter: Payer: Self-pay | Admitting: Gastroenterology

## 2018-11-05 ENCOUNTER — Other Ambulatory Visit: Payer: Self-pay

## 2018-11-05 VITALS — BP 104/72 | HR 75 | Temp 98.4°F | Resp 18 | Ht 65.0 in | Wt 143.0 lb

## 2018-11-05 DIAGNOSIS — D123 Benign neoplasm of transverse colon: Secondary | ICD-10-CM

## 2018-11-05 DIAGNOSIS — D120 Benign neoplasm of cecum: Secondary | ICD-10-CM

## 2018-11-05 DIAGNOSIS — I8500 Esophageal varices without bleeding: Secondary | ICD-10-CM

## 2018-11-05 DIAGNOSIS — K2950 Unspecified chronic gastritis without bleeding: Secondary | ICD-10-CM

## 2018-11-05 DIAGNOSIS — K7460 Unspecified cirrhosis of liver: Secondary | ICD-10-CM

## 2018-11-05 DIAGNOSIS — K2970 Gastritis, unspecified, without bleeding: Secondary | ICD-10-CM

## 2018-11-05 DIAGNOSIS — D649 Anemia, unspecified: Secondary | ICD-10-CM

## 2018-11-05 DIAGNOSIS — B9681 Helicobacter pylori [H. pylori] as the cause of diseases classified elsewhere: Secondary | ICD-10-CM

## 2018-11-05 DIAGNOSIS — R188 Other ascites: Principal | ICD-10-CM

## 2018-11-05 DIAGNOSIS — K5520 Angiodysplasia of colon without hemorrhage: Secondary | ICD-10-CM

## 2018-11-05 DIAGNOSIS — D122 Benign neoplasm of ascending colon: Secondary | ICD-10-CM

## 2018-11-05 DIAGNOSIS — D125 Benign neoplasm of sigmoid colon: Secondary | ICD-10-CM

## 2018-11-05 MED ORDER — SODIUM CHLORIDE 0.9 % IV SOLN: 500.0000 mL | Freq: Once | INTRAVENOUS | Status: DC

## 2018-11-05 MED ORDER — PROPRANOLOL HCL 20 MG/5ML PO SOLN: 20.0000 mg | Freq: Two times a day (BID) | ORAL | Status: DC

## 2018-11-05 MED ORDER — PROPRANOLOL HCL 20 MG PO TABS: 20.0000 mg | ORAL_TABLET | Freq: Two times a day (BID) | ORAL | 1 refills | Status: AC

## 2018-11-05 NOTE — Progress Notes (Signed)
Pt's states no medical or surgical changes since previsit or office visit. 

## 2018-11-05 NOTE — Op Note (Signed)
[facility] Patient Name: Michael Hamilton Procedure Date: 11/05/2018 9:24 AM MRN: [id number] Endoscopist: Remo Lipps P. Havery Moros , MD Age: 58 Referring MD:  Date of Birth: 1960-08-03 Gender: Male Account #: [id number] Procedure:                Colonoscopy Indications:              Anemia, This is the patient's first colonoscopy,                            screening Medicines:                Monitored Anesthesia Care Procedure:                Pre-Anesthesia Assessment:                           - Prior to the procedure, a History and Physical                            was performed, and patient medications and                            allergies were reviewed. The patient's tolerance of                            previous anesthesia was also reviewed. The risks                            and benefits of the procedure and the sedation                            options and risks were discussed with the patient.                            All questions were answered, and informed consent                            was obtained. Prior Anticoagulants: The patient has                            taken no previous anticoagulant or antiplatelet                            agents. ASA Grade Assessment: III - A patient with                            severe systemic disease. After reviewing the risks                            and benefits, the patient was deemed in                            satisfactory condition to undergo the procedure.  After obtaining informed consent, the colonoscope                            was passed under direct vision. Throughout the                            procedure, the patient's blood pressure, pulse, and                            oxygen saturations were monitored continuously. The                            Model CF-HQ190L [id number]) scope was introduced                            through the anus and advanced to the the  cecum,                            identified by appendiceal orifice and ileocecal                            valve. The colonoscopy was performed without                            difficulty. The patient tolerated the procedure                            well. The quality of the bowel preparation was                            adequate. The ileocecal valve, appendiceal orifice,                            and rectum were photographed. Scope In: 9:49:15 AM Scope Out: 10:12:33 AM Scope Withdrawal Time: 0 hours 18 minutes 19 seconds  Total Procedure Duration: 0 hours 23 minutes 18 seconds  Findings:                 The perianal and digital rectal examinations were                            normal.                           A 2 mm polyp was found in the cecum. The polyp was                            sessile. The polyp was removed with a cold biopsy                            forceps. Resection and retrieval were complete.                           A 2 mm polyp was found in the ascending colon. The  polyp was sessile. The polyp was removed with a                            cold biopsy forceps. Resection and retrieval were                            complete.                           A 2 mm polyp was found in the transverse colon. The                            polyp was sessile. The polyp was removed with a                            cold biopsy forceps. Resection and retrieval were                            complete.                           A 3 mm polyp was found in the sigmoid colon. The                            polyp was sessile. The polyp was removed with a                            cold biopsy forceps. Resection and retrieval were                            complete.                           Three angiodysplastic lesions were found in the                            transverse colon (2) and in the ascending colon (1).                           The left  colon was tortuous with a sharply                            angulated turn.                           The rectum was small, thus retroflexed views not                            obtained. The exam was otherwise without                            abnormality. Time was taken to lavage the colon to  achieve adequate views. Complications:            No immediate complications. Estimated blood loss:                            Minimal. Estimated Blood Loss:     Estimated blood loss was minimal. Impression:               - One 2 mm polyp in the cecum, removed with a cold                            biopsy forceps. Resected and retrieved.                           - One 2 mm polyp in the ascending colon, removed                            with a cold biopsy forceps. Resected and retrieved.                           - One 2 mm polyp in the transverse colon, removed                            with a cold biopsy forceps. Resected and retrieved.                           - One 3 mm polyp in the sigmoid colon, removed with                            a cold biopsy forceps. Resected and retrieved.                           - Three colonic angiodysplastic lesions.                           - Tortuous colon.                           - The examination was otherwise normal. Recommendation:           - Patient has a contact number available for                            emergencies. The signs and symptoms of potential                            delayed complications were discussed with the                            patient. Return to normal activities tomorrow.                            Written discharge instructions were provided to the  patient.                           - Resume previous diet.                           - Continue present medications.                           - Await pathology results. Remo Lipps P. Kersten Salmons, MD 11/05/2018 10:20:11  AM This report has been signed electronically.

## 2018-11-05 NOTE — Progress Notes (Signed)
Called to room to assist during endoscopic procedure.  Patient ID and intended procedure confirmed with present staff. Received instructions for my participation in the procedure from the performing physician.  

## 2018-11-05 NOTE — Progress Notes (Signed)
To PACU, VSS. Report to [email or web address] 

## 2018-11-05 NOTE — Op Note (Signed)
[facility] Patient Name: Michael Hamilton Procedure Date: 11/05/2018 9:24 AM MRN: [id number] Endoscopist: Remo Lipps P. Havery Moros , MD Age: 58 Referring MD:  Date of Birth: [birth year] Gender: Male Account #: [id number] Procedure:                Upper GI endoscopy Indications:              Cirrhosis rule out esophageal varices (suspected                            autoimmune hepatitis, liver biopsy pending per IR),                            anemia Medicines:                Monitored Anesthesia Care Procedure:                Pre-Anesthesia Assessment:                           - Prior to the procedure, a History and Physical                            was performed, and patient medications and                            allergies were reviewed. The patient's tolerance of                            previous anesthesia was also reviewed. The risks                            and benefits of the procedure and the sedation                            options and risks were discussed with the patient.                            All questions were answered, and informed consent                            was obtained. Prior Anticoagulants: The patient has                            taken no previous anticoagulant or antiplatelet                            agents. ASA Grade Assessment: III - A patient with                            severe systemic disease. After reviewing the risks                            and benefits, the patient was deemed in  satisfactory condition to undergo the procedure.                           After obtaining informed consent, the endoscope was                            passed under direct vision. Throughout the                            procedure, the patient's blood pressure, pulse, and                            oxygen saturations were monitored continuously. The                            Model GIF-HQ190 [id number]) scope was  introduced                            through the mouth, and advanced to the second part                            of duodenum. The upper GI endoscopy was                            accomplished without difficulty. The patient                            tolerated the procedure well. Scope In: Scope Out: Findings:                 Esophagogastric landmarks were identified: the                            Z-line was found at 36 cm, the gastroesophageal                            junction was found at 36 cm and the upper extent of                            the gastric folds was found at 36 cm from the                            incisors.                           Grade II varices were found in the middle third of                            the esophagus and in the lower third of the                            esophagus. They were medium in size without any  high risk stigmata for bleeding.                           The exam of the esophagus was otherwise normal.                           Diffuse mildly erythematous mucosa was found in the                            entire examined stomach without ulceration.                            Biopsies were taken with a cold forceps for                            Helicobacter pylori testing.                           The exam of the stomach was otherwise normal. No                            gastric varices.                           The duodenal bulb and second portion of the                            duodenum were normal. Complications:            No immediate complications. Estimated blood loss:                            Minimal. Estimated Blood Loss:     Estimated blood loss was minimal. Impression:               - Esophagogastric landmarks identified.                           - Grade II esophageal varices.                           - Normal esophagus otherwise.                           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 Biopsied.                           - Normal stomach otherwise, no gastric varices                           - Normal duodenal bulb and second portion of the                            duodenum. Recommendation:           -  Patient has a contact number available for                            emergencies. The signs and symptoms of potential                            delayed complications were discussed with the                            patient. Return to normal activities tomorrow.                            Written discharge instructions were provided to the                            patient.                           - Resume previous diet.                           - Continue present medications.                           - Start propranolol 20mg  twice daily for treatment                            of varices                           - Await pathology results.                           - Await liver biopsy result with further                            recommendations Remo Lipps P. Armbruster, MD 11/05/2018 10:26:20 AM This report has been signed electronically.

## 2018-11-05 NOTE — Patient Instructions (Signed)
Information on polyps given to you today.  Await pathology results.  Start Propranolol 20mg  twice a day.    YOU HAD AN ENDOSCOPIC PROCEDURE TODAY AT [facility]:   Refer to the procedure report that was given to you for any specific questions about what was found during the examination.  If the procedure report does not answer your questions, please call your gastroenterologist to clarify.  If you requested that your care partner not be given the details of your procedure findings, then the procedure report has been included in a sealed envelope for you to review at your convenience later.  YOU SHOULD EXPECT: Some feelings of bloating in the abdomen. Passage of more gas than usual.  Walking can help get rid of the air that was put into your GI tract during the procedure and reduce the bloating. If you had a lower endoscopy (such as a colonoscopy or flexible sigmoidoscopy) you may notice spotting of blood in your stool or on the toilet paper. If you underwent a bowel prep for your procedure, you may not have a normal bowel movement for a few days.  Please Note:  You might notice some irritation and congestion in your nose or some drainage.  This is from the oxygen used during your procedure.  There is no need for concern and it should clear up in a day or so.  SYMPTOMS TO REPORT IMMEDIATELY:   Following lower endoscopy (colonoscopy or flexible sigmoidoscopy):  Excessive amounts of blood in the stool  Significant tenderness or worsening of abdominal pains  Swelling of the abdomen that is new, acute  Fever of 100F or higher   Following upper endoscopy (EGD)  Vomiting of blood or coffee ground material  New chest pain or pain under the shoulder blades  Painful or persistently difficult swallowing  New shortness of breath  Fever of 100F or higher  Black, tarry-looking stools  For urgent or emergent issues, a gastroenterologist can be reached at any hour by calling [phone]  [phone].   DIET:  We do recommend a small meal at first, but then you may proceed to your regular diet.  Drink plenty of fluids but you should avoid alcoholic beverages for 24 hours.  ACTIVITY:  You should plan to take it easy for the rest of today and you should NOT DRIVE or use heavy machinery until tomorrow (because of the sedation medicines used during the test).    FOLLOW UP: Our staff will call the number listed on your records the next business day following your procedure to check on you and address any questions or concerns that you may have regarding the information given to you following your procedure. If we do not reach you, we will leave a message.  However, if you are feeling well and you are not experiencing any problems, there is no need to return our call.  We will assume that you have returned to your regular daily activities without incident.  If any biopsies were taken you will be contacted by phone or by letter within the next 1-3 weeks.  Please call us at [phone] if you have not heard about the biopsies in 3 weeks.    SIGNATURES/CONFIDENTIALITY: You and/or your care partner have signed paperwork which will be entered into your electronic medical record.  These signatures attest to the fact that that the information above on your After Visit Summary has been reviewed and is understood.  Full responsibility of the confidentiality of  this discharge information lies with you and/or your care-partner.

## 2018-11-05 NOTE — Telephone Encounter (Signed)
Patient daughter called said that the pharmacy said that the medication for propranolol (INDERAL) 20 MG/5ML solution 20 mg has still not been sent. Pharmacy:[facility], [place], [place], [place]

## 2018-11-06 ENCOUNTER — Telehealth: Payer: Self-pay | Admitting: *Deleted

## 2018-11-06 MED ORDER — PROPRANOLOL HCL 20 MG/5ML PO SOLN: 20.0000 mg | Freq: Two times a day (BID) | ORAL | 3 refills | Status: AC

## 2018-11-06 MED FILL — SPIRONOLACTONE 50 MG TABS: 50 | 30 days supply | Qty: 30 | Fill #0

## 2018-11-06 MED FILL — metFORMIN HCL ER 500 MG TB2: 500 | 30 days supply | Qty: 120 | Fill #0

## 2018-11-06 MED FILL — LANTUS SOLOSTAR 100 UNITS/M: 100 | 30 days supply | Qty: 3 | Fill #0

## 2018-11-06 MED FILL — FUROSEMIDE 20 MG TABS: 20 | 30 days supply | Qty: 30 | Fill #0

## 2018-11-06 NOTE — Telephone Encounter (Signed)
  Follow up Call-  Call back number 11/05/2018  Post procedure Call Back phone  # [phone] number [name] ok to speak with daughter.  Permission to leave phone message Yes  Some recent data might be hidden    Spoke with daughter Patient questions:  Do you have a fever, pain , or abdominal swelling? no Pain Score  0  Have you tolerated food without any problems? yes  Have you been able to return to your normal activities? yes  Do you have any questions about your discharge instructions: Diet   no Medications  no Follow up visit  no  Do you have questions or concerns about your Care? no Actions: * If pain score is 4 or above:

## 2018-11-06 NOTE — Telephone Encounter (Signed)
rx sent

## 2018-11-10 ENCOUNTER — Other Ambulatory Visit: Payer: Self-pay

## 2018-11-10 MED ORDER — OMEPRAZOLE 40 MG PO CPDR: 40.0000 mg | DELAYED_RELEASE_CAPSULE | Freq: Two times a day (BID) | ORAL | 0 refills | Status: AC

## 2018-11-10 MED ORDER — AMOXICILLIN 500 MG PO CAPS: 1000.0000 mg | ORAL_CAPSULE | Freq: Two times a day (BID) | ORAL | 0 refills | Status: DC

## 2018-11-10 MED ORDER — METRONIDAZOLE 500 MG PO TABS: 500.0000 mg | ORAL_TABLET | Freq: Two times a day (BID) | ORAL | 0 refills | Status: DC

## 2018-11-10 MED ORDER — CLARITHROMYCIN 500 MG PO TABS: 500.0000 mg | ORAL_TABLET | Freq: Two times a day (BID) | ORAL | 0 refills | Status: DC

## 2018-11-10 MED FILL — METRONIDAZOLE 500 MG TABS: 500 | 14 days supply | Qty: 28 | Fill #0

## 2018-11-10 MED FILL — CLARITHROMYCIN 500 MG TAB: 500 | 14 days supply | Qty: 28 | Fill #0

## 2018-11-10 MED FILL — OMEPRAZOLE 40 MG CPDR: 40 | 14 days supply | Qty: 28 | Fill #0

## 2018-11-10 MED FILL — AMOXICILLIN 500 MG CAPSULE: 500 | 14 days supply | Qty: 56 | Fill #0

## 2018-11-13 ENCOUNTER — Other Ambulatory Visit: Payer: Self-pay | Admitting: Radiology

## 2018-11-15 ENCOUNTER — Other Ambulatory Visit: Payer: Self-pay | Admitting: Physician Assistant

## 2018-11-16 ENCOUNTER — Ambulatory Visit (HOSPITAL_COMMUNITY)
Admission: RE | Admit: 2018-11-16 | Discharge: 2018-11-16 | Disposition: A | Discharge status: Home / Self Care | Payer: Medicaid Other | Source: Ambulatory Visit | Attending: Gastroenterology | Admitting: Gastroenterology

## 2018-11-16 ENCOUNTER — Other Ambulatory Visit: Payer: Self-pay | Admitting: Gastroenterology

## 2018-11-16 ENCOUNTER — Encounter (HOSPITAL_COMMUNITY): Payer: Self-pay | Admitting: Diagnostic Radiology

## 2018-11-16 ENCOUNTER — Other Ambulatory Visit: Payer: Self-pay

## 2018-11-16 DIAGNOSIS — I10 Essential (primary) hypertension: Secondary | ICD-10-CM | POA: Insufficient documentation

## 2018-11-16 DIAGNOSIS — K7460 Unspecified cirrhosis of liver: Secondary | ICD-10-CM

## 2018-11-16 DIAGNOSIS — Z79899 Other long term (current) drug therapy: Secondary | ICD-10-CM | POA: Insufficient documentation

## 2018-11-16 DIAGNOSIS — D649 Anemia, unspecified: Secondary | ICD-10-CM

## 2018-11-16 DIAGNOSIS — Z833 Family history of diabetes mellitus: Secondary | ICD-10-CM | POA: Insufficient documentation

## 2018-11-16 DIAGNOSIS — E119 Type 2 diabetes mellitus without complications: Secondary | ICD-10-CM | POA: Insufficient documentation

## 2018-11-16 DIAGNOSIS — K739 Chronic hepatitis, unspecified: Secondary | ICD-10-CM | POA: Insufficient documentation

## 2018-11-16 DIAGNOSIS — Z87891 Personal history of nicotine dependence: Secondary | ICD-10-CM | POA: Insufficient documentation

## 2018-11-16 DIAGNOSIS — R188 Other ascites: Secondary | ICD-10-CM | POA: Insufficient documentation

## 2018-11-16 DIAGNOSIS — Z794 Long term (current) use of insulin: Secondary | ICD-10-CM | POA: Insufficient documentation

## 2018-11-16 HISTORY — PX: IR US GUIDE VASC ACCESS RIGHT: IMG2390

## 2018-11-16 HISTORY — PX: IR TRANSCATHETER BX: IMG713

## 2018-11-16 HISTORY — PX: IR VENOGRAM HEPATIC WO HEMODYNAMIC EVALUATION: IMG693

## 2018-11-16 LAB — COMPREHENSIVE METABOLIC PANEL
ALT: 154 U/L — ABNORMAL HIGH (ref 0–44)
AST: 170 U/L — ABNORMAL HIGH (ref 15–41)
Albumin: 2.7 g/dL — ABNORMAL LOW (ref 3.5–5.0)
Alkaline Phosphatase: 135 U/L — ABNORMAL HIGH (ref 38–126)
Anion gap: 5 (ref 5–15)
BUN: 10 mg/dL (ref 6–20)
CO2: 29 mmol/L (ref 22–32)
Calcium: 8.6 mg/dL — ABNORMAL LOW (ref 8.9–10.3)
Chloride: 98 mmol/L (ref 98–111)
Creatinine, Ser: 1.13 mg/dL (ref 0.61–1.24)
GFR calc Af Amer: >60 mL/min (ref >60)
GFR calc non Af Amer: >60 mL/min (ref >60)
Glucose, Bld: 313 mg/dL — ABNORMAL HIGH (ref 70–99)
Potassium: 4.1 mmol/L (ref 3.5–5.1)
Sodium: 132 mmol/L — ABNORMAL LOW (ref 135–145)
Total Bilirubin: 3.4 mg/dL — ABNORMAL HIGH (ref 0.3–1.2)
Total Protein: 10.8 g/dL — ABNORMAL HIGH (ref 6.5–8.1)

## 2018-11-16 LAB — CBC WITH DIFFERENTIAL/PLATELET
Abs Immature Granulocytes: 0.02 10*3/uL (ref 0.00–0.07)
Basophils Absolute: 0 10*3/uL (ref 0.0–0.1)
Basophils Relative: 1 %
Eosinophils Absolute: 0.1 10*3/uL (ref 0.0–0.5)
Eosinophils Relative: 2 %
HCT: 34.6 % — ABNORMAL LOW (ref 39.0–52.0)
Hemoglobin: 11.7 g/dL — ABNORMAL LOW (ref 13.0–17.0)
Immature Granulocytes: 0 %
Lymphocytes Relative: 34 %
Lymphs Abs: 1.5 10*3/uL (ref 0.7–4.0)
MCH: 25.2 pg — ABNORMAL LOW (ref 26.0–34.0)
MCHC: 33.8 g/dL (ref 30.0–36.0)
MCV: 74.6 fL — ABNORMAL LOW (ref 80.0–100.0)
Monocytes Absolute: 0.4 10*3/uL (ref 0.1–1.0)
Monocytes Relative: 9 %
Neutro Abs: 2.5 10*3/uL (ref 1.7–7.7)
Neutrophils Relative %: 54 %
Platelets: 94 10*3/uL — ABNORMAL LOW (ref 150–400)
RBC: 4.64 MIL/uL (ref 4.22–5.81)
RDW: 15.2 % (ref 11.5–15.5)
WBC: 4.6 10*3/uL (ref 4.0–10.5)
nRBC: 0 % (ref 0.0–0.2)

## 2018-11-16 LAB — PROTIME-INR
INR: 1.6 — ABNORMAL HIGH (ref 0.8–1.2)
Prothrombin Time: 19 seconds — ABNORMAL HIGH (ref 11.4–15.2)

## 2018-11-16 LAB — GLUCOSE, CAPILLARY
Glucose-Capillary: 286 mg/dL — ABNORMAL HIGH (ref 70–99)
Glucose-Capillary: 250 mg/dL — ABNORMAL HIGH (ref 70–99)

## 2018-11-16 MED ORDER — FENTANYL CITRATE (PF) 100 MCG/2ML IJ SOLN
INTRAMUSCULAR | Status: AC | PRN
  Administered 2018-11-16: 25 ug via INTRAVENOUS
  Administered 2018-11-16: 50 ug via INTRAVENOUS

## 2018-11-16 MED ORDER — INSULIN ASPART 100 UNIT/ML ~~LOC~~ SOLN
0.0000 [IU] | Freq: Three times a day (TID) | SUBCUTANEOUS | Status: DC
  Administered 2018-11-16: 7 [IU] via SUBCUTANEOUS

## 2018-11-16 MED ORDER — LIDOCAINE HCL 1 % IJ SOLN
INTRAMUSCULAR | Status: AC
  Filled 2018-11-16: qty 20

## 2018-11-16 MED ORDER — MIDAZOLAM HCL 2 MG/2ML IJ SOLN
INTRAMUSCULAR | Status: AC | PRN
  Administered 2018-11-16: 1 mg via INTRAVENOUS
  Administered 2018-11-16: 0.5 mg via INTRAVENOUS

## 2018-11-16 MED ORDER — SODIUM CHLORIDE 0.9 % IV SOLN: INTRAVENOUS | Status: DC

## 2018-11-16 MED ORDER — MIDAZOLAM HCL 2 MG/2ML IJ SOLN
INTRAMUSCULAR | Status: AC
  Filled 2018-11-16: qty 2

## 2018-11-16 MED ORDER — INSULIN ASPART 100 UNIT/ML ~~LOC~~ SOLN
SUBCUTANEOUS | Status: AC
  Administered 2018-11-16: 7 [IU] via SUBCUTANEOUS
  Filled 2018-11-16: qty 1

## 2018-11-16 MED ORDER — FENTANYL CITRATE (PF) 100 MCG/2ML IJ SOLN
INTRAMUSCULAR | Status: AC
  Filled 2018-11-16: qty 2

## 2018-11-16 MED ORDER — IOHEXOL 300 MG/ML  SOLN
50.0000 mL | Freq: Once | INTRAMUSCULAR | Status: AC | PRN
  Administered 2018-11-16: 50 mL via INTRAVENOUS

## 2018-11-16 MED ORDER — LIDOCAINE HCL 1 % IJ SOLN
INTRAMUSCULAR | Status: AC | PRN
  Administered 2018-11-16: 10 mL

## 2018-11-16 MED ORDER — OXYCODONE HCL 5 MG PO TABS: 5.0000 mg | ORAL_TABLET | ORAL | Status: DC | PRN

## 2018-11-16 NOTE — H&P (Signed)
Chief Complaint: Patient was seen in consultation today for transjugular liver biopsy at the request of [place]  Referring Physician(s): Armbruster,Steven P  Supervising Physician: Markus Daft  Patient Status: [facility] - Out-pt  History of Present Illness: Michael Hamilton is a 58 y.o. male   Pt with Hx elevated liver functions Possible Hx Hepatitis Cirrhosis with ascites Noted wt loss Not feeling well for weeks Anemia Abd distension resolved and LE edema resolved  [place] 08/25/18:  IMPRESSION: Hepatic cirrhosis is noted with associated ascites. No definite focal hepatic abnormality is seen sonographically.  INR today 1.6 Glucose 313 today 335 one mo ago per chart Pt denies symptoms "running in 200 range for weeks"  Request for TJ liver biopsy per Dr Havery Moros  Past Medical History:  Diagnosis Date  . Anemia   . Cirrhosis ([place])    with ascites  . Diabetes mellitus ([place])   . Essential hypertension   . Frequent headaches   . Rotator cuff arthropathy of right shoulder     Past Surgical History:  Procedure Laterality Date  . SHOULDER ARTHROSCOPY W/ ROTATOR CUFF REPAIR Right     Allergies: Patient has no known allergies.  Medications: Prior to Admission medications   Medication Sig Start Date End Date Taking? Authorizing Provider  furosemide (LASIX) 20 MG tablet Take 1 tablet (20 mg total) by mouth daily. 10/06/18  Yes Armbruster, Carlota Raspberry, MD  Insulin Glargine (LANTUS SOLOSTAR) 100 UNIT/ML Solostar Pen Inject 10 Units into the skin daily at 10 pm. 07/31/18  Yes Santos-Sanchez, Merlene Morse, MD  metFORMIN (GLUCOPHAGE-XR) 500 MG 24 hr tablet Take 2 tablets (1,000 mg total) by mouth 2 (two) times daily. 07/31/18  Yes Santos-Sanchez, Merlene Morse, MD  omeprazole (PRILOSEC) 40 MG capsule Take 1 capsule (40 mg total) by mouth 2 (two) times daily. 11/10/18  Yes Armbruster, Carlota Raspberry, MD  propranolol (INDERAL) 20 MG tablet Take 1 tablet (20 mg total) by mouth 2 (two) times daily.  11/05/18  Yes Armbruster, Carlota Raspberry, MD  spironolactone (ALDACTONE) 50 MG tablet Take 1 tablet (50 mg total) by mouth daily. 10/06/18  Yes Armbruster, Carlota Raspberry, MD  amoxicillin (AMOXIL) 500 MG capsule Take 2 capsules (1,000 mg total) by mouth 2 (two) times daily. 11/10/18   Armbruster, Carlota Raspberry, MD  clarithromycin (BIAXIN) 500 MG tablet Take 1 tablet (500 mg total) by mouth 2 (two) times daily. 11/10/18   Armbruster, Carlota Raspberry, MD  glucose blood (ONETOUCH VERIO) test strip Test blood sugars as directed. Dx: E11.9 09/11/16   Dorothyann Peng, NP  Insulin Pen Needle 31G X 5 MM MISC Use 1 time daily with injections 07/31/18   Santos-Sanchez, Merlene Morse, MD  metroNIDAZOLE (FLAGYL) 500 MG tablet Take 1 tablet (500 mg total) by mouth 2 (two) times daily. 11/10/18   Armbruster, Carlota Raspberry, MD  ONE TOUCH LANCETS MISC Test blood sugars as directed. Dx: E11.9 09/11/16   Nafziger, Tommi Rumps, NP  polyethylene glycol [facility]) packet Take 17 g by mouth 2 (two) times daily. 08/21/18   Santos-Sanchez, Merlene Morse, MD  propranolol (INDERAL) 20 MG/5ML solution Take 5 mLs (20 mg total) by mouth 2 (two) times a day. 11/06/18   Armbruster, Carlota Raspberry, MD  senna-docusate (SENOKOT-S) 8.6-50 MG tablet Take 2 tablets by mouth 2 (two) times daily. 08/21/18   [name], MD     Family History  Problem Relation Age of Onset  . Diabetes Neg Hx   . Colon cancer Neg Hx   . Esophageal cancer Neg Hx  Social History   Socioeconomic History  . Marital status: Married    Spouse name: Not on file  . Number of children: Not on file  . Years of education: Not on file  . Highest education level: Not on file  Occupational History  . Not on file  Social Needs  . Financial resource strain: Not on file  . Food insecurity:    Worry: Not on file    Inability: Not on file  . Transportation needs:    Medical: Not on file    Non-medical: Not on file  Tobacco Use  . Smoking status: Former Smoker    Years: 2.00  . Smokeless tobacco: Never  Used  . Tobacco comment: quit when he was 21  Substance and Sexual Activity  . Alcohol use: No  . Drug use: No  . Sexual activity: Yes    Birth control/protection: None  Lifestyle  . Physical activity:    Days per week: Not on file    Minutes per session: Not on file  . Stress: Not on file  Relationships  . Social connections:    Talks on phone: Not on file    Gets together: Not on file    Attends religious service: Not on file    Active member of club or organization: Not on file    Attends meetings of clubs or organizations: Not on file    Relationship status: Not on file  Other Topics Concern  . Not on file  Social History Narrative   Works for [employer]      Review of Systems: A 12 point ROS discussed and pertinent positives are indicated in the HPI above.  All other systems are negative.  Review of Systems  Constitutional: Positive for activity change, appetite change, fatigue and unexpected weight change. Negative for fever.  Respiratory: Negative for cough and shortness of breath.   Cardiovascular: Negative for chest pain.  Gastrointestinal: Positive for nausea. Negative for abdominal distention and abdominal pain.  Neurological: Positive for weakness.  Psychiatric/Behavioral: Negative for behavioral problems and confusion.    Vital Signs: BP (!) 142/93   Pulse 67   Temp 97.9 F (36.6 C) (Oral)   Resp 16   Ht 5\' 5"  (1.651 m)   Wt 141 lb 1.5 oz (64 kg)   SpO2 100%   BMI 23.48 kg/m   Physical Exam Vitals signs reviewed.  Cardiovascular:     Rate and Rhythm: Normal rate and regular rhythm.     Heart sounds: Normal heart sounds.  Pulmonary:     Breath sounds: Normal breath sounds.  Abdominal:     General: Bowel sounds are normal. There is no distension.     Tenderness: There is no abdominal tenderness.  Musculoskeletal: Normal range of motion.  Skin:    General: Skin is warm and dry.  Neurological:     Mental Status: He is alert and oriented to  person, place, and time.  Psychiatric:        Mood and Affect: Mood normal.        Behavior: Behavior normal.        Thought Content: Thought content normal.        Judgment: Judgment normal.     Comments: Consented with Interpreter at bedside [facility]      Imaging: No results found.  Labs:  CBC: Recent Labs    07/31/18 1551 10/06/18 1701 11/16/18 0809  WBC 5.1 5.8 4.6  HGB 10.8* 10.8* 11.7*  HCT 33.7* 33.3* 34.6*  PLT 105* 100.0* 94*    COAGS: Recent Labs    07/31/18 1551 10/06/18 1701 11/16/18 0809  INR 1.36 1.5* 1.6*    BMP: Recent Labs    07/31/18 1551 08/28/18 1015 09/15/18 1041 10/06/18 1701 11/16/18 0830  NA 138 135 139 131* 132*  K 3.9 4.2 4.3 4.0 4.1  CL 103 99 103 100 98  CO2 24 22 20 28 29   GLUCOSE 175* 106* 103* 335* 313*  BUN 8 8 10 11 10   CALCIUM 8.4* 9.1 9.2 9.3 8.6*  CREATININE 1.23 1.03 0.90 1.05 1.13  GFRNONAA 64 79 93  --  >60  GFRAA 74 91 108  --  >60    LIVER FUNCTION TESTS: Recent Labs    08/28/18 1015 09/15/18 1041 10/06/18 1701 11/16/18 0830  BILITOT 2.7* 2.1* 1.9* 3.4*  AST 153* 126* 104* 170*  ALT 134* 119* 129* 154*  ALKPHOS 124* 111 105 135*  PROT 9.7* 9.4* 9.5* 10.8*  ALBUMIN 3.5* 3.3* 3.3* 2.7*    TUMOR MARKERS: Recent Labs    10/06/18 1701  AFPTM 10.2*    Assessment and Plan:  Elevated liver functions B Leg edema resolved abd distension resolved Cirrhosis with ascites Scheduled for random liver biopsy (transjugular) Risks and benefits of transjugular random liver biopsy was discussed with the patient and/or patient's family including, but not limited to bleeding, infection, damage to adjacent structures or low yield requiring additional tests.  All of the questions were answered and there is agreement to proceed. Consent signed and in chart.   Thank you for this interesting consult.  I greatly enjoyed meeting Michael Hamilton and look forward to participating in their care.  A copy of this report  was sent to the requesting provider on this date.  Electronically Signed: Lavonia Drafts, PA-C 11/16/2018, 10:20 AM   I spent a total of  30 Minutes   in face to face in clinical consultation, greater than 50% of which was counseling/coordinating care for TJ liver bx

## 2018-11-16 NOTE — Procedures (Signed)
Interventional Radiology Procedure:   Indications: Cirrhosis and request for liver biopsy.  History of ascites and slightly elevated INR.  Procedure: Transjugular liver biopsy  Findings: 4 cores obtained.  Access from right hepatic vein  Complications: none     EBL: Less than 10 ml  Plan: Bedrest 3 hours.  Plan to discharge to home .     Revere Maahs R. Anselm Pancoast, MD  Pager: [phone]

## 2018-11-16 NOTE — Discharge Instructions (Signed)
Liver Biopsy, Care After °These instructions give you information on caring for yourself after your procedure. Your doctor may also give you more specific instructions. Call your doctor if you have any problems or questions after your procedure. °What can I expect after the procedure? °After the procedure, it is common to have: °· Pain and soreness where the biopsy was done. °· Bruising around the area where the biopsy was done. °· Sleepiness and be tired for a few days. °Follow these instructions at home: °Medicines °· Take over-the-counter and prescription medicines only as told by your doctor. °· If you were prescribed an antibiotic medicine, take it as told by your doctor. Do not stop taking the antibiotic even if you start to feel better. °· Do not take medicines such as aspirin and ibuprofen. These medicines can thin your blood. Do not take these medicines unless your doctor tells you to take them. °· If you are taking prescription pain medicine, take actions to prevent or treat constipation. Your doctor may recommend that you: °? Drink enough fluid to keep your pee (urine) clear or pale yellow. °? Take over-the-counter or prescription medicines. °? Eat foods that are high in fiber, such as fresh fruits and vegetables, whole grains, and beans. °? Limit foods that are high in fat and processed sugars, such as fried and sweet foods. °Caring for your cut °· Follow instructions from your doctor about how to take care of your cuts from surgery (incisions). Make sure you: °? Wash your hands with soap and water before you change your bandage (dressing). If you cannot use soap and water, use hand sanitizer. °? Change your bandage as told by your doctor. °? Leave stitches (sutures), skin glue, or skin tape (adhesive) strips in place. They may need to stay in place for 2 weeks or longer. If tape strips get loose and curl up, you may trim the loose edges. Do not remove tape strips completely unless your doctor says it is  okay. °· Check your cuts every day for signs of infection. Check for: °? Redness, swelling, or more pain. °? Fluid or blood. °? Pus or a bad smell. °? Warmth. °· Do not take baths, swim, or use a hot tub until your doctor says it is okay to do so. °Activity ° °· Rest at home for 1-2 days or as told by your doctor. °? Avoid sitting for a long time without moving. Get up to take short walks every 1-2 hours. °· Return to your normal activities as told by your doctor. Ask what activities are safe for you. °· Do not do these things in the first 24 hours: °? Drive. °? Use machinery. °? Take a bath or shower. °· Do not lift more than 10 pounds (4.5 kg) or play contact sports for the first 2 weeks. °General instructions ° °· Do not drink alcohol in the first week after the procedure. °· Have someone stay with you for at least 24 hours after the procedure. °· Get your test results. Ask your doctor or the department that is doing the test: °? When will my results be ready? °? How will I get my results? °? What are my treatment options? °? What other tests do I need? °? What are my next steps? °· Keep all follow-up visits as told by your doctor. This is important. °Contact a doctor if: °· A cut bleeds and leaves more than just a small spot of blood. °· A cut is red, puffs up (  swells), or hurts more than before. °· Fluid or something else comes from a cut. °· A cut smells bad. °· You have a fever or chills. °Get help right away if: °· You have swelling, bloating, or pain in your belly (abdomen). °· You get dizzy or faint. °· You have a rash. °· You feel sick to your stomach (nauseous) or throw up (vomit). °· You have trouble breathing, feel short of breath, or feel faint. °· Your chest hurts. °· You have problems talking or seeing. °· You have trouble with your balance or moving your arms or legs. °Summary °· After the procedure, it is common to have pain, soreness, bruising, and tiredness. °· Your doctor will tell you how to  take care of yourself at home. Change your bandage, take your medicines, and limit your activities as told by your doctor. °· Call your doctor if you have symptoms of infection. Get help right away if your belly swells, your cut bleeds a lot, or you have trouble talking or breathing. °This information is not intended to replace advice given to you by your health care provider. Make sure you discuss any questions you have with your health care provider. °Document Released: 04/16/2008 Document Revised: 07/18/2017 Document Reviewed: 07/18/2017 °Elsevier Interactive Patient Education © 2019 [employer] °Moderate Conscious Sedation, Adult, Care After °These instructions provide you with information about caring for yourself after your procedure. Your health care provider may also give you more specific instructions. Your treatment has been planned according to current medical practices, but problems sometimes occur. Call your health care provider if you have any problems or questions after your procedure. °What can I expect after the procedure? °After your procedure, it is common: °· To feel sleepy for several hours. °· To feel clumsy and have poor balance for several hours. °· To have poor judgment for several hours. °· To vomit if you eat too soon. °Follow these instructions at home: °For at least 24 hours after the procedure: ° °· Do not: °? Participate in activities where you could fall or become injured. °? Drive. °? Use heavy machinery. °? Drink alcohol. °? Take sleeping pills or medicines that cause drowsiness. °? Make important decisions or sign legal documents. °? Take care of children on your own. °· Rest. °Eating and drinking °· Follow the diet recommended by your health care provider. °· If you vomit: °? Drink water, juice, or soup when you can drink without vomiting. °? Make sure you have little or no nausea before eating solid foods. °General instructions °· Have a responsible adult stay with you until  you are awake and alert. °· Take over-the-counter and prescription medicines only as told by your health care provider. °· If you smoke, do not smoke without supervision. °· Keep all follow-up visits as told by your health care provider. This is important. °Contact a health care provider if: °· You keep feeling nauseous or you keep vomiting. °· You feel light-headed. °· You develop a rash. °· You have a fever. °Get help right away if: °· You have trouble breathing. °This information is not intended to replace advice given to you by your health care provider. Make sure you discuss any questions you have with your health care provider. °Document Released: 04/28/2013 Document Revised: 12/11/2015 Document Reviewed: 10/28/2015 °Elsevier Interactive Patient Education © 2019 [employer] ° °

## 2018-11-18 ENCOUNTER — Other Ambulatory Visit: Payer: Self-pay

## 2018-11-18 DIAGNOSIS — K759 Inflammatory liver disease, unspecified: Secondary | ICD-10-CM

## 2018-11-18 DIAGNOSIS — K7460 Unspecified cirrhosis of liver: Secondary | ICD-10-CM

## 2018-11-18 DIAGNOSIS — R188 Other ascites: Principal | ICD-10-CM

## 2018-11-18 MED ORDER — PREDNISONE 10 MG PO TABS: 30.0000 mg | ORAL_TABLET | ORAL | 3 refills | Status: DC

## 2018-11-18 MED ORDER — ENTECAVIR 0.5 MG PO TABS: 0.5000 mg | ORAL_TABLET | Freq: Every day | ORAL | 3 refills | Status: AC

## 2018-11-18 MED ORDER — PREDNISONE 10 MG PO TABS: 30.0000 mg | ORAL_TABLET | ORAL | 3 refills | Status: AC

## 2018-11-18 MED FILL — ENTECAVIR 0.5 MG TABS: 0.5 | 30 days supply | Qty: 30 | Fill #0

## 2018-11-18 NOTE — Progress Notes (Signed)
entecavir

## 2018-11-20 MED FILL — predniSONE 10 MG TABS: 10 | 30 days supply | Qty: 63 | Fill #0

## 2018-11-30 ENCOUNTER — Telehealth: Payer: Self-pay

## 2018-11-30 NOTE — Telephone Encounter (Signed)
-----   Message from Hughie Closs, RN sent at 11/18/2018  2:33 PM EDT ----- Call and have pt. Come in for CMET and IgG( 2 wks=12/02/18)

## 2018-11-30 NOTE — Telephone Encounter (Signed)
Spoke to daughter and let her know of patient's labs due Wed. 12/02/18

## 2018-11-30 NOTE — Telephone Encounter (Signed)
Called patient's daughter (pt's contact) to ask pt. To come in for labs. No voice mail. Will try again

## 2018-12-02 ENCOUNTER — Other Ambulatory Visit (INDEPENDENT_AMBULATORY_CARE_PROVIDER_SITE_OTHER): Payer: Self-pay

## 2018-12-02 ENCOUNTER — Other Ambulatory Visit: Payer: Self-pay | Admitting: Gastroenterology

## 2018-12-02 ENCOUNTER — Other Ambulatory Visit: Payer: Self-pay

## 2018-12-02 ENCOUNTER — Telehealth: Payer: Self-pay | Admitting: Gastroenterology

## 2018-12-02 DIAGNOSIS — K7460 Unspecified cirrhosis of liver: Secondary | ICD-10-CM

## 2018-12-02 DIAGNOSIS — R188 Other ascites: Secondary | ICD-10-CM

## 2018-12-02 LAB — COMPREHENSIVE METABOLIC PANEL
ALT: 108 U/L — ABNORMAL HIGH (ref 0–53)
AST: 112 U/L — ABNORMAL HIGH (ref 0–37)
Albumin: 2.8 g/dL — ABNORMAL LOW (ref 3.5–5.2)
Alkaline Phosphatase: 124 U/L — ABNORMAL HIGH (ref 39–117)
BUN: 20 mg/dL (ref 6–23)
CO2: 27 mEq/L (ref 19–32)
Calcium: 8.9 mg/dL (ref 8.4–10.5)
Chloride: 93 mEq/L — ABNORMAL LOW (ref 96–112)
Creatinine, Ser: 1.23 mg/dL (ref 0.40–1.50)
GFR: 60.15 mL/min (ref >60.00)
Glucose, Bld: 536 mg/dL (ref 70–99)
Potassium: 5.1 mEq/L (ref 3.5–5.1)
Sodium: 124 mEq/L — ABNORMAL LOW (ref 135–145)
Total Bilirubin: 2.9 mg/dL — ABNORMAL HIGH (ref 0.2–1.2)
Total Protein: 9.2 g/dL — ABNORMAL HIGH (ref 6.0–8.3)

## 2018-12-02 MED FILL — UNIFINE PENTIPS 31GX3/16: 31G X 5 MM | 90 days supply | Qty: 100 | Fill #0

## 2018-12-02 MED FILL — UNIFINE PENTIPS 31GX3/16": 31G X 5 MM | 90 days supply | Qty: 100 | Fill #0

## 2018-12-02 MED FILL — LANTUS SOLOSTAR 100 UNITS/M: 100 | 30 days supply | Qty: 3 | Fill #1

## 2018-12-02 MED FILL — predniSONE 10 MG TABS: 10 | 30 days supply | Qty: 63 | Fill #0

## 2018-12-02 MED FILL — metFORMIN HCL ER 500 MG TB2: 500 | 30 days supply | Qty: 120 | Fill #1

## 2018-12-02 MED FILL — FUROSEMIDE 20 MG TABS: 20 | 30 days supply | Qty: 30 | Fill #1

## 2018-12-02 MED FILL — SPIRONOLACTONE 50 MG TABS: 50 | 30 days supply | Qty: 30 | Fill #1

## 2018-12-02 NOTE — Telephone Encounter (Signed)
Patient daughter called has questions about the medication predniSONE (DELTASONE) 10 MG tablet after she picked them up.

## 2018-12-03 LAB — IGG: IgG (Immunoglobin G), Serum: 4798 mg/dL — ABNORMAL HIGH (ref 600–1640)

## 2018-12-03 NOTE — Telephone Encounter (Signed)
Spoke to patient's daughter and answered her questions about the Prednisone. Also stated her dad got a new home glucose monitor from their pharmacy yesterday. His BS this am is 215

## 2018-12-07 ENCOUNTER — Telehealth: Payer: Self-pay | Admitting: Gastroenterology

## 2018-12-07 NOTE — Telephone Encounter (Signed)
Michael Hamilton, looks like this is complicated. And they have lots of questions!

## 2018-12-07 NOTE — Telephone Encounter (Signed)
Pt's daughter Michael Hamilton would like to know if pt needs to keep taking biaxin, metronidazole, omeprazole and amoxicilin. She also wants to know the name of a medication that was sent to [facility]. Pls call her.

## 2018-12-08 NOTE — Telephone Encounter (Signed)
Called daughter back and answered all her questions about her dad's meds

## 2018-12-13 ENCOUNTER — Emergency Department (HOSPITAL_COMMUNITY)
Admission: EM | Admit: 2018-12-13 | Discharge: 2018-12-13 | Disposition: A | Discharge status: Home / Self Care | Payer: Medicaid Other | Attending: Emergency Medicine | Admitting: Emergency Medicine

## 2018-12-13 ENCOUNTER — Emergency Department (HOSPITAL_COMMUNITY): Payer: Medicaid Other

## 2018-12-13 ENCOUNTER — Encounter (HOSPITAL_COMMUNITY): Payer: Self-pay | Admitting: Emergency Medicine

## 2018-12-13 ENCOUNTER — Other Ambulatory Visit: Payer: Self-pay

## 2018-12-13 DIAGNOSIS — R1032 Left lower quadrant pain: Secondary | ICD-10-CM | POA: Diagnosis present

## 2018-12-13 DIAGNOSIS — Z87891 Personal history of nicotine dependence: Secondary | ICD-10-CM | POA: Insufficient documentation

## 2018-12-13 DIAGNOSIS — I10 Essential (primary) hypertension: Secondary | ICD-10-CM | POA: Insufficient documentation

## 2018-12-13 DIAGNOSIS — K529 Noninfective gastroenteritis and colitis, unspecified: Secondary | ICD-10-CM | POA: Diagnosis not present

## 2018-12-13 DIAGNOSIS — E119 Type 2 diabetes mellitus without complications: Secondary | ICD-10-CM | POA: Insufficient documentation

## 2018-12-13 DIAGNOSIS — Z794 Long term (current) use of insulin: Secondary | ICD-10-CM | POA: Diagnosis not present

## 2018-12-13 DIAGNOSIS — Z79899 Other long term (current) drug therapy: Secondary | ICD-10-CM | POA: Diagnosis not present

## 2018-12-13 DIAGNOSIS — Z20828 Contact with and (suspected) exposure to other viral communicable diseases: Secondary | ICD-10-CM | POA: Diagnosis not present

## 2018-12-13 DIAGNOSIS — R0602 Shortness of breath: Secondary | ICD-10-CM | POA: Diagnosis not present

## 2018-12-13 LAB — CBC
HCT: 32 % — ABNORMAL LOW (ref 39.0–52.0)
Hemoglobin: 10.5 g/dL — ABNORMAL LOW (ref 13.0–17.0)
MCH: 24.6 pg — ABNORMAL LOW (ref 26.0–34.0)
MCHC: 32.8 g/dL (ref 30.0–36.0)
MCV: 75.1 fL — ABNORMAL LOW (ref 80.0–100.0)
Platelets: DECREASED 10*3/uL (ref 150–400)
RBC: 4.26 MIL/uL (ref 4.22–5.81)
RDW: 15.2 % (ref 11.5–15.5)
WBC: 21 10*3/uL — ABNORMAL HIGH (ref 4.0–10.5)
nRBC: 0.1 % (ref 0.0–0.2)

## 2018-12-13 LAB — COMPREHENSIVE METABOLIC PANEL
ALT: 122 U/L — ABNORMAL HIGH (ref 0–44)
AST: 114 U/L — ABNORMAL HIGH (ref 15–41)
Albumin: 2.3 g/dL — ABNORMAL LOW (ref 3.5–5.0)
Alkaline Phosphatase: 92 U/L (ref 38–126)
Anion gap: 9 (ref 5–15)
BUN: 21 mg/dL — ABNORMAL HIGH (ref 6–20)
CO2: 23 mmol/L (ref 22–32)
Calcium: 8.3 mg/dL — ABNORMAL LOW (ref 8.9–10.3)
Chloride: 94 mmol/L — ABNORMAL LOW (ref 98–111)
Creatinine, Ser: 1.64 mg/dL — ABNORMAL HIGH (ref 0.61–1.24)
GFR calc Af Amer: 52 mL/min — ABNORMAL LOW (ref >60)
GFR calc non Af Amer: 45 mL/min — ABNORMAL LOW (ref >60)
Glucose, Bld: 294 mg/dL — ABNORMAL HIGH (ref 70–99)
Potassium: 4.5 mmol/L (ref 3.5–5.1)
Sodium: 126 mmol/L — ABNORMAL LOW (ref 135–145)
Total Bilirubin: 5.6 mg/dL — ABNORMAL HIGH (ref 0.3–1.2)
Total Protein: 8.8 g/dL — ABNORMAL HIGH (ref 6.5–8.1)

## 2018-12-13 LAB — SARS CORONAVIRUS 2 BY RT PCR (HOSPITAL ORDER, PERFORMED IN ~~LOC~~ HOSPITAL LAB): SARS Coronavirus 2: NEGATIVE

## 2018-12-13 LAB — LIPASE, BLOOD: Lipase: 26 U/L (ref 11–51)

## 2018-12-13 LAB — PROTIME-INR
INR: 2 — ABNORMAL HIGH (ref 0.8–1.2)
Prothrombin Time: 22.1 seconds — ABNORMAL HIGH (ref 11.4–15.2)

## 2018-12-13 MED ORDER — MORPHINE SULFATE 15 MG PO TABS: 15.0000 mg | ORAL_TABLET | ORAL | 0 refills | Status: DC | PRN

## 2018-12-13 MED ORDER — ONDANSETRON 4 MG PO TBDP: 4.0000 mg | ORAL_TABLET | Freq: Three times a day (TID) | ORAL | 0 refills | Status: DC | PRN

## 2018-12-13 MED ORDER — METRONIDAZOLE 500 MG PO TABS: 500.0000 mg | ORAL_TABLET | Freq: Two times a day (BID) | ORAL | 0 refills | Status: DC

## 2018-12-13 MED ORDER — MORPHINE SULFATE (PF) 4 MG/ML IV SOLN
4.0000 mg | Freq: Once | INTRAVENOUS | Status: AC
  Administered 2018-12-13: 4 mg via INTRAVENOUS
  Filled 2018-12-13: qty 1

## 2018-12-13 MED ORDER — SODIUM CHLORIDE 0.9% FLUSH
3.0000 mL | Freq: Once | INTRAVENOUS | Status: AC
  Administered 2018-12-13: 3 mL via INTRAVENOUS

## 2018-12-13 MED ORDER — ONDANSETRON HCL 4 MG/2ML IJ SOLN
4.0000 mg | Freq: Once | INTRAMUSCULAR | Status: AC
  Administered 2018-12-13: 4 mg via INTRAVENOUS
  Filled 2018-12-13: qty 2

## 2018-12-13 MED ORDER — IOHEXOL 300 MG/ML  SOLN
100.0000 mL | Freq: Once | INTRAMUSCULAR | Status: AC | PRN
  Administered 2018-12-13: 21:00:00 100 mL via INTRAVENOUS

## 2018-12-13 MED ORDER — CIPROFLOXACIN HCL 500 MG PO TABS: 500.0000 mg | ORAL_TABLET | Freq: Two times a day (BID) | ORAL | 0 refills | Status: DC

## 2018-12-13 MED ORDER — ONDANSETRON 4 MG PO TBDP: 4.0000 mg | ORAL_TABLET | Freq: Three times a day (TID) | ORAL | 0 refills | Status: AC | PRN

## 2018-12-13 MED ORDER — MORPHINE SULFATE 15 MG PO TABS: 15.0000 mg | ORAL_TABLET | ORAL | 0 refills | Status: AC | PRN

## 2018-12-13 NOTE — ED Provider Notes (Signed)
[facility] EMERGENCY DEPARTMENT Provider Note   CSN: [id number] Arrival date & time: 12/13/18  1824    History   Chief Complaint Chief Complaint  Patient presents with   Abdominal Pain    HPI Michael Hamilton is a 58 y.o. male.     58 yo M with a chief complaint of left lower quadrant abdominal pain.  This been going on for the past few days.  Worse with movement palpation and twisting.  Sometimes feels that it radiates down his leg.  Has caused him the most difficulty when he tries to ambulate.  Seems to come and go.  Had some constipation and vomiting associated with this.  Has a history of cirrhosis is seeing the [facility] gastroenterology for that.  Has been taking his medications as prescribed.  Feels that the pain sometimes cause and have trouble breathing as well.  He denies fevers.  The history is provided by the patient. The history is limited by a language barrier. A language interpreter was used.  Abdominal Pain  Pain location:  LLQ Pain quality: sharp and shooting   Pain radiates to:  Does not radiate Pain severity:  Moderate Onset quality:  Gradual Duration:  2 days Timing:  Constant Progression:  Worsening Chronicity:  New Relieved by:  Nothing Worsened by:  Movement Ineffective treatments:  None tried Associated symptoms: nausea and vomiting   Associated symptoms: no chest pain, no chills, no diarrhea, no fever and no shortness of breath     Past Medical History:  Diagnosis Date   Anemia    Cirrhosis ([place])    with ascites   Diabetes mellitus ([place])    Essential hypertension    Frequent headaches    Rotator cuff arthropathy of right shoulder     Patient Active Problem List   Diagnosis Date Noted   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 Authorizing Provider  amoxicillin (AMOXIL) 500 MG capsule Take 2 capsules (1,000 mg total) by mouth 2 (two) times daily. 11/10/18   Armbruster, Carlota Raspberry, MD  ciprofloxacin (CIPRO) 500 MG tablet Take 1 tablet (500 mg total) by mouth 2 (two) times daily. One po bid x 7 days 12/13/18   Deno Etienne, DO  clarithromycin (BIAXIN) 500 MG tablet Take 1 tablet (500 mg total) by mouth 2 (two) times daily. 11/10/18   Armbruster, Carlota Raspberry, MD  entecavir (BARACLUDE) 0.5 MG tablet Take 1 tablet (0.5 mg total) by mouth daily. 11/18/18   Armbruster, Carlota Raspberry, MD  furosemide (LASIX) 20 MG tablet Take 1 tablet (20 mg total) by mouth daily. 10/06/18   Armbruster, Carlota Raspberry, MD  glucose blood (ONETOUCH VERIO) test strip Test blood sugars as directed. Dx: E11.9 09/11/16   Dorothyann Peng, NP  Insulin Glargine (LANTUS SOLOSTAR) 100 UNIT/ML Solostar Pen Inject 10 Units into the skin daily at 10 pm. 07/31/18   [name], MD  Insulin  Pen Needle 31G X 5 MM MISC Use 1 time daily with injections 07/31/18   Santos-Sanchez, Merlene Morse, MD  metFORMIN (GLUCOPHAGE-XR) 500 MG 24 hr tablet Take 2 tablets (1,000 mg total) by mouth 2 (two) times daily. 07/31/18   Santos-Sanchez, Merlene Morse, MD  metroNIDAZOLE (FLAGYL) 500 MG tablet Take 1 tablet (500 mg total) by mouth 2 (two) times daily. 12/13/18   Deno Etienne, DO  morphine (MSIR) 15 MG tablet Take 1 tablet (15 mg total) by mouth every 4 (four) hours as needed for severe pain. 12/13/18   Deno Etienne, DO  omeprazole (PRILOSEC) 40 MG capsule Take 1 capsule (40 mg total) by mouth 2 (two) times daily. 11/10/18    Armbruster, Carlota Raspberry, MD  ondansetron (ZOFRAN ODT) 4 MG disintegrating tablet Take 1 tablet (4 mg total) by mouth every 8 (eight) hours as needed for nausea or vomiting. 12/13/18   Deno Etienne, DO  ONE TOUCH LANCETS MISC Test blood sugars as directed. Dx: E11.9 09/11/16   Nafziger, Tommi Rumps, NP  polyethylene glycol [facility]) packet Take 17 g by mouth 2 (two) times daily. 08/21/18   Santos-Sanchez, Merlene Morse, MD  propranolol (INDERAL) 20 MG tablet Take 1 tablet (20 mg total) by mouth 2 (two) times daily. 11/05/18   Armbruster, Carlota Raspberry, MD  propranolol (INDERAL) 20 MG/5ML solution Take 5 mLs (20 mg total) by mouth 2 (two) times a day. 11/06/18   Armbruster, Carlota Raspberry, MD  senna-docusate (SENOKOT-S) 8.6-50 MG tablet Take 2 tablets by mouth 2 (two) times daily. 08/21/18   Santos-Sanchez, Merlene Morse, MD  spironolactone (ALDACTONE) 50 MG tablet Take 1 tablet (50 mg total) by mouth daily. 10/06/18   Armbruster, Carlota Raspberry, MD    Family History Family History  Problem Relation Age of Onset   Diabetes Neg Hx    Colon cancer Neg Hx    Esophageal cancer Neg Hx     Social History Social History   Tobacco Use   Smoking status: Former Smoker    Years: 2.00   Smokeless tobacco: Never Used   Tobacco comment: quit when he was 4  Substance Use Topics   Alcohol use: No   Drug use: No     Allergies   Patient has no known allergies.   Review of Systems Review of Systems  Constitutional: Negative for chills and fever.  HENT: Negative for congestion and facial swelling.   Eyes: Negative for discharge and visual disturbance.  Respiratory: Negative for shortness of breath.   Cardiovascular: Negative for chest pain and palpitations.  Gastrointestinal: Positive for abdominal pain, nausea and vomiting. Negative for diarrhea.  Musculoskeletal: Negative for arthralgias and myalgias.  Skin: Negative for color change and rash.  Neurological: Negative for tremors, syncope and headaches.  Psychiatric/Behavioral:  Negative for confusion and dysphoric mood.     Physical Exam Updated Vital Signs BP (!) 98/50    Pulse 73    Temp 99.5 F (37.5 C) (Oral)    Resp 14    SpO2 96%   Physical Exam Vitals signs and nursing note reviewed.  Constitutional:      Appearance: He is well-developed.     Comments: Jaundiced  HENT:     Head: Normocephalic and atraumatic.  Eyes:     Pupils: Pupils are equal, round, and reactive to light.  Neck:     Musculoskeletal: Normal range of motion and neck supple.     Vascular: No JVD.  Cardiovascular:     Rate and Rhythm: Normal rate and regular rhythm.  Heart sounds: No murmur. No friction rub. No gallop.   Pulmonary:     Effort: No respiratory distress.     Breath sounds: No wheezing.  Abdominal:     General: There is no distension.     Tenderness: There is no guarding or rebound.  Musculoskeletal: Normal range of motion.  Skin:    Coloration: Skin is not pale.     Findings: No rash.  Neurological:     Mental Status: He is alert and oriented to person, place, and time.  Psychiatric:        Behavior: Behavior normal.      ED Treatments / Results  Labs (all labs ordered are listed, but only abnormal results are displayed) Labs Reviewed  COMPREHENSIVE METABOLIC PANEL - Abnormal; Notable for the following components:      Result Value   Sodium 126 (*)    Chloride 94 (*)    Glucose, Bld 294 (*)    BUN 21 (*)    Creatinine, Ser 1.64 (*)    Calcium 8.3 (*)    Total Protein 8.8 (*)    Albumin 2.3 (*)    AST 114 (*)    ALT 122 (*)    Total Bilirubin 5.6 (*)    GFR calc non Af Amer 45 (*)    GFR calc Af Amer 52 (*)    All other components within normal limits  CBC - Abnormal; Notable for the following components:   WBC 21.0 (*)    Hemoglobin 10.5 (*)    HCT 32.0 (*)    MCV 75.1 (*)    MCH 24.6 (*)    All other components within normal limits  PROTIME-INR - Abnormal; Notable for the following components:   Prothrombin Time 22.1 (*)    INR  2.0 (*)    All other components within normal limits  SARS CORONAVIRUS 2 (HOSPITAL ORDER, [facility])  LIPASE, BLOOD  URINALYSIS, ROUTINE W REFLEX MICROSCOPIC    EKG None  Radiology Ct Abdomen Pelvis W Contrast  Result Date: 12/13/2018 CLINICAL DATA:  Generalized abdominal pain with nausea and vomiting. EXAM: CT ABDOMEN AND PELVIS WITH CONTRAST TECHNIQUE: Multidetector CT imaging of the abdomen and pelvis was performed using the standard protocol following bolus administration of intravenous contrast. CONTRAST:  154mL OMNIPAQUE IOHEXOL 300 MG/ML  SOLN COMPARISON:  CT dated 01/25/2008. FINDINGS: Lower chest: The heart size is mildly enlarged. There are trace bilateral pleural effusions. Hepatobiliary: The liver is cirrhotic appearing. There is no discrete hepatic mass. The gallbladder is distended. There is mild gallbladder wall thickening. Pancreas: Unremarkable. No pancreatic ductal dilatation or surrounding inflammatory changes. Spleen: The spleen is enlarged measuring approximately 18 cm craniocaudad. Adrenals/Urinary Tract: Adrenal glands are unremarkable. Kidneys are normal, without renal calculi, focal lesion, or hydronephrosis. Bladder is unremarkable. Stomach/Bowel: There is diffuse wall thickening of the stomach. There is no evidence of a small-bowel obstruction. There is diffuse wall thickening of the colon at the level of the splenic flexure as well as the mid sigmoid colon. There is a moderate amount of stool. The appendix is unremarkable. Vascular/Lymphatic: There is no abdominal aortic aneurysm. Minimal atherosclerotic changes are noted. There is recanalization of the umbilical vein. 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 Reproductive: Prostate is unremarkable. Other: There is a trace volume of abdominal ascites. Musculoskeletal: No acute or significant osseous findings. IMPRESSION: 1. Cirrhosis with sequela of portal hypertension including significant splenomegaly and a  small volume of abdominal ascites. There is no  discrete hepatic mass. 2. Diffuse wall thickening involving the sigmoid colon and the splenic flexure and proximal descending colon is suspicious for infectious or inflammatory colitis. No evidence of a small-bowel obstruction. 3. Trace bilateral pleural effusions. Electronically Signed   By: Constance Holster M.D.   On: 12/13/2018 21:29   Dg Chest Port 1 View  Result Date: 12/13/2018 CLINICAL DATA:  Shortness of breath EXAM: PORTABLE CHEST 1 VIEW COMPARISON:  None. FINDINGS: Cardiac shadows within normal limits. The lungs are well aerated bilaterally. No focal infiltrate or effusion is seen. No bony abnormality is noted. IMPRESSION: No acute abnormality seen. Electronically Signed   By: Inez Catalina M.D.   On: 12/13/2018 20:31    Procedures Procedures (including critical care time)  Medications Ordered in ED Medications  sodium chloride flush (NS) 0.9 % injection 3 mL (3 mLs Intravenous Given 12/13/18 2045)  morphine 4 MG/ML injection 4 mg (4 mg Intravenous Given 12/13/18 2039)  ondansetron (ZOFRAN) injection 4 mg (4 mg Intravenous Given 12/13/18 2039)  iohexol (OMNIPAQUE) 300 MG/ML solution 100 mL (100 mLs Intravenous Contrast Given 12/13/18 2113)     Initial Impression / Assessment and Plan / ED Course  I have reviewed the triage vital signs and the nursing notes.  Pertinent labs & imaging results that were available during my care of the patient were reviewed by me and considered in my medical decision making (see chart for details).        58 yo M with left lower quadrant abdominal pain.  Going on for the past couple days.  Having some constipation nausea and vomiting.  Has a significant leukocytosis of 22,000, CT scan was performed after a bedside ultrasound showed no significant ascites.  CT with colitis.  Offered inpatient versus outpatient therapy.  Patient electing to do trial as an outpatient.  Will prescribe antibiotics.  Pain and  nausea medicine.  PCP and GI follow-up.  11:20 PM:  I have discussed the diagnosis/risks/treatment options with the patient and believe the pt to be eligible for discharge home to follow-up with PCP, GI. We also discussed returning to the ED immediately if new or worsening sx occur. We discussed the sx which are most concerning (e.g., sudden worsening pain, fever, inability to tolerate by mouth) that necessitate immediate return. Medications administered to the patient during their visit and any new prescriptions provided to the patient are listed below.  Medications given during this visit Medications  sodium chloride flush (NS) 0.9 % injection 3 mL (3 mLs Intravenous Given 12/13/18 2045)  morphine 4 MG/ML injection 4 mg (4 mg Intravenous Given 12/13/18 2039)  ondansetron (ZOFRAN) injection 4 mg (4 mg Intravenous Given 12/13/18 2039)  iohexol (OMNIPAQUE) 300 MG/ML solution 100 mL (100 mLs Intravenous Contrast Given 12/13/18 2113)     The patient appears reasonably screen and/or stabilized for discharge and I doubt any other medical condition or other [facility] requiring further screening, evaluation, or treatment in the ED at this time prior to discharge.    Final Clinical Impressions(s) / ED Diagnoses   Final diagnoses:  Colitis    ED Discharge Orders         Ordered    ciprofloxacin (CIPRO) 500 MG tablet  2 times daily     12/13/18 2230    metroNIDAZOLE (FLAGYL) 500 MG tablet  2 times daily     12/13/18 2230    morphine (MSIR) 15 MG tablet  Every 4 hours PRN,   Status:  Discontinued  12/13/18 2245    ondansetron (ZOFRAN ODT) 4 MG disintegrating tablet  Every 8 hours PRN,   Status:  Discontinued     12/13/18 2245    morphine (MSIR) 15 MG tablet  Every 4 hours PRN     12/13/18 2252    ondansetron (ZOFRAN ODT) 4 MG disintegrating tablet  Every 8 hours PRN     12/13/18 2252           Deno Etienne, DO 12/13/18 2320

## 2018-12-13 NOTE — ED Notes (Signed)
Primary RN aware pt daughter would like an update [phone]

## 2018-12-13 NOTE — ED Notes (Signed)
Patient verbalizes understanding of discharge instructions. Opportunity for questioning and answers were provided. Armband removed by staff, pt discharged from ED.  

## 2018-12-13 NOTE — Discharge Instructions (Addendum)
Please return for worsening pain, fever or inability to eat or drink.

## 2018-12-13 NOTE — ED Notes (Signed)
Pt daughter given update on pt status and condition. Opportunity for questions and answers provided.

## 2018-12-13 NOTE — ED Notes (Signed)
Daughter- [phone] [name]

## 2018-12-13 NOTE — ED Triage Notes (Addendum)
C/o generalized abd pain, nausea, vomiting, and SOB x that is worse over 5-6 months. Denies diarrhea.  History of cirrhosis.

## 2018-12-15 ENCOUNTER — Telehealth: Payer: Self-pay

## 2018-12-15 MED FILL — MORPHINE SULFATE IR 15 MG T: 15 | 1 days supply | Qty: 5 | Fill #0

## 2018-12-15 MED FILL — ONDANSETRON ODT 4 MG TABLET: 4 | 7 days supply | Qty: 20 | Fill #0

## 2018-12-15 NOTE — Telephone Encounter (Signed)
Called daughter and asked to bring her dad in to get lab draw this week (CMET)

## 2018-12-15 NOTE — Telephone Encounter (Signed)
Spoke to patient's daughter. He is feeling better than when he went into the ED. No diarrhea, no more nausea, abdominal pain is better but still intermittent in the lower/mid abdominal area.  He had finished there antibiotics we gave him for H pylori. He is out of entecavir, but is still taking prednisone. He will come in tomorrow for CBC and CMET. His Hepatology consult is June 15th

## 2018-12-15 NOTE — Telephone Encounter (Signed)
-----   Message from Hughie Closs, RN sent at 12/02/2018  1:36 PM EDT ----- Call pt's daughter [name] and remind to come in for CMET-due 5/27

## 2018-12-15 NOTE — Telephone Encounter (Signed)
-----   Message from Yetta Flock, MD sent at 12/15/2018 10:02 AM EDT ----- Regarding: ED follow up Sherlynn Stalls can you touch base with this patient and see how he is doing. I got a notice from the ED he was there this weekend. Apparently he had pain in his abdomen and CT scan done showing some evidence of colitis, although he reportedly had constipation, no diarrhea.  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 He is awaiting a hepatology consult.   He had a WBC elevation to 21, although some of this could be driven by steroids. ED apparently gave him cipro and flagyl but he should have already been on flagyl as part of H pylori regimen as above, not sure why they gave that to him. He also had a kidney injury.  Can you help with the following: - please see how he is doing, is he any better? - if he has any diarrhea we need to order a GI pathogen panel (this will also test for C Diff) - I need him to go back to the lab for CBC and CMET if you can ask him to do that today or tomorrow - can you clarify which antibiotics he is taking, did he finish course for H pylori? - can you clarify if he is taking his prednisone / entecavir - can you clarify when he is seeing Hepatology?  Thanks. Patient may need to be admitted if he continues to feel poorly or labs worsen. Thanks

## 2018-12-16 ENCOUNTER — Telehealth: Payer: Self-pay | Admitting: Gastroenterology

## 2018-12-16 ENCOUNTER — Other Ambulatory Visit (INDEPENDENT_AMBULATORY_CARE_PROVIDER_SITE_OTHER): Payer: Self-pay

## 2018-12-16 ENCOUNTER — Other Ambulatory Visit: Payer: Self-pay

## 2018-12-16 ENCOUNTER — Other Ambulatory Visit: Payer: Self-pay | Admitting: Gastroenterology

## 2018-12-16 DIAGNOSIS — R188 Other ascites: Secondary | ICD-10-CM

## 2018-12-16 DIAGNOSIS — K7460 Unspecified cirrhosis of liver: Secondary | ICD-10-CM

## 2018-12-16 LAB — COMPREHENSIVE METABOLIC PANEL
ALT: 60 U/L — ABNORMAL HIGH (ref 0–53)
AST: 37 U/L (ref 0–37)
Albumin: 2.4 g/dL — ABNORMAL LOW (ref 3.5–5.2)
Alkaline Phosphatase: 82 U/L (ref 39–117)
BUN: 41 mg/dL — ABNORMAL HIGH (ref 6–23)
CO2: 23 mEq/L (ref 19–32)
Calcium: 8.8 mg/dL (ref 8.4–10.5)
Chloride: 95 mEq/L — ABNORMAL LOW (ref 96–112)
Creatinine, Ser: 1.94 mg/dL — ABNORMAL HIGH (ref 0.40–1.50)
GFR: 35.55 mL/min — ABNORMAL LOW (ref >60.00)
Glucose, Bld: 328 mg/dL — ABNORMAL HIGH (ref 70–99)
Potassium: 4.6 mEq/L (ref 3.5–5.1)
Sodium: 124 mEq/L — ABNORMAL LOW (ref 135–145)
Total Bilirubin: 2.7 mg/dL — ABNORMAL HIGH (ref 0.2–1.2)
Total Protein: 8 g/dL (ref 6.0–8.3)

## 2018-12-16 MED FILL — ENTECAVIR 0.5 MG TABS: 0.5 | 30 days supply | Qty: 30 | Fill #1

## 2018-12-16 NOTE — Telephone Encounter (Signed)
Called patient's daughter [name] and let her know that there are still 3 refills at [facility] for Entecavir. And that her dad needs to start back on that Today. Also if he does run out to let us know,because it is Very important that he stays on it. She will pick it up today.

## 2018-12-16 NOTE — Telephone Encounter (Signed)
Thanks Sherlynn Stalls, To be clear he cannot stop Entecavir, he needs to be back on that and can you refill it for him and ask him to start it ASAP. If he runs out in the future he needs to contact us. I will await his labs with further recommendations. Thanks

## 2018-12-16 NOTE — Telephone Encounter (Signed)
Called [facility] pharmacy and spoke with [place]. She found order for Entecavir, but they are out. She can get it in tomorrow. She will let the daughter know

## 2018-12-16 NOTE — Telephone Encounter (Signed)
Pt's daughter [name] called stating that she went to [place] and was told that they do not have entecavir and told her that it is at [place], she then went to [facility] and was told that they did not have any notification about that. Pt's daughter is back at [facility] at this moment and is asking if we can help her with this. Pls call her.

## 2018-12-17 ENCOUNTER — Telehealth: Payer: Self-pay | Admitting: Gastroenterology

## 2018-12-18 NOTE — Telephone Encounter (Signed)
Called patient's daughter([name]).  Said patient had vomiting a couple of times yesterday after taking Zofran. He had dissolved the tab in water and got sick on his stomach after. Has not had any emesis since. Told daughter her dad can just put the tab under his tongue and let it dissolve. Asked her to call if he has any more n/v

## 2018-12-19 ENCOUNTER — Telehealth: Payer: Self-pay | Admitting: Nurse Practitioner

## 2018-12-20 NOTE — Telephone Encounter (Signed)
Daughter called for answering service 12/19/2018 at 1pm. She stated father complains of abdominal bloat, no abdominal pain. No fever. HX of Hep B cirrhosis. He had labs done on 12/16/2018, she stated Dr. Havery Moros reviewed and provided a lab order to have done on Monday 6/1. Labs 5/27 showed significant hyponatremia NA 124, Elevated Cr 1.94 and Elevated INR 2.0. .T. Bili down 2.7. AST 37. ALT 60. GLU 328. Dr. Havery Moros advised he stop Lasix and Aldactone, drink Pedialyte, repeat BMET and CBC on 6/1. Continue Prednisone (possible autoimmune hepatitis), take Entecavir QOD. He is passing normal to soft BMs daily, no black stools or rectal bleeding. Daugher reports his abdomen is not hard and no swelling to his legs. I advised daughter to have labs done in ED if his father's bloat worsens or if he develops abdominal pain. He will need office exam, hopefully Monday. I will contact our office to coordinate visit with Dr. Havery Moros. He was on antibiotics during his recent hospitalization. Consider probiotics until he is seen in office. I will call pt Sunday 5/30 for further update. Reiterated, to ER if sx worsen.

## 2018-12-21 ENCOUNTER — Telehealth: Payer: Self-pay | Admitting: Nurse Practitioner

## 2018-12-21 ENCOUNTER — Telehealth: Payer: Self-pay | Admitting: *Deleted

## 2018-12-21 ENCOUNTER — Other Ambulatory Visit (INDEPENDENT_AMBULATORY_CARE_PROVIDER_SITE_OTHER): Payer: Self-pay

## 2018-12-21 DIAGNOSIS — K7460 Unspecified cirrhosis of liver: Secondary | ICD-10-CM

## 2018-12-21 LAB — BASIC METABOLIC PANEL
BUN: 22 mg/dL (ref 6–23)
CO2: 22 mEq/L (ref 19–32)
Calcium: 9.4 mg/dL (ref 8.4–10.5)
Chloride: 92 mEq/L — ABNORMAL LOW (ref 96–112)
Creatinine, Ser: 1.34 mg/dL (ref 0.40–1.50)
GFR: 54.48 mL/min — ABNORMAL LOW (ref >60.00)
Glucose, Bld: 344 mg/dL — ABNORMAL HIGH (ref 70–99)
Potassium: 5 mEq/L (ref 3.5–5.1)
Sodium: 124 mEq/L — ABNORMAL LOW (ref 135–145)

## 2018-12-21 LAB — CBC WITH DIFFERENTIAL/PLATELET
Basophils Absolute: 0 10*3/uL (ref 0.0–0.1)
Basophils Relative: 0.1 % (ref 0.0–3.0)
Eosinophils Absolute: 0 10*3/uL (ref 0.0–0.7)
Eosinophils Relative: 0.1 % (ref 0.0–5.0)
HCT: 31.2 % — ABNORMAL LOW (ref 39.0–52.0)
Hemoglobin: 10.2 g/dL — ABNORMAL LOW (ref 13.0–17.0)
Lymphocytes Relative: 10 % — ABNORMAL LOW (ref 12.0–46.0)
Lymphs Abs: 1.1 10*3/uL (ref 0.7–4.0)
MCHC: 32.7 g/dL (ref 30.0–36.0)
MCV: 78 fl (ref 78.0–100.0)
Monocytes Absolute: 0.7 10*3/uL (ref 0.1–1.0)
Monocytes Relative: 5.8 % (ref 3.0–12.0)
Neutro Abs: 9.4 10*3/uL — ABNORMAL HIGH (ref 1.4–7.7)
Neutrophils Relative %: 84 % — ABNORMAL HIGH (ref 43.0–77.0)
Platelets: 133 10*3/uL — ABNORMAL LOW (ref 150.0–400.0)
RBC: 4 Mil/uL — ABNORMAL LOW (ref 4.22–5.81)
RDW: 15.9 % — ABNORMAL HIGH (ref 11.5–15.5)
WBC: 11.2 10*3/uL — ABNORMAL HIGH (ref 4.0–10.5)

## 2018-12-21 NOTE — Telephone Encounter (Signed)
Patient and patient daughter walked into the office. Patient daughter says that his symptoms are the same but would like to speak to Michael Best, NP regarding probiotics that she had suggested.   Also, Jan is going to speak to Dr. Havery Moros and see when we can schedule him for a Virtual Appointment.

## 2018-12-21 NOTE — Telephone Encounter (Signed)
Daughter calls and states dr Havery Moros told her that Michael Hamilton may be getting too much insulin, she states his blood sugars are from 200 to 600. She states these are very low??? Her they are actually high. She states the appt for wed morning is very good. She is advised to bring meter and medicines to visit, she will accompany him as his interp.

## 2018-12-21 NOTE — Telephone Encounter (Signed)
I spoke to daughter, father had labs done today, still c/o bloat, now feels constipated.  Advised Miralax, Align or Phillip's probiotic, Jan to coordinate virtual visiti with  Dr. Havery Moros, await lab results. See prior msg over the weekend.

## 2018-12-22 ENCOUNTER — Other Ambulatory Visit: Payer: Self-pay

## 2018-12-22 DIAGNOSIS — K7460 Unspecified cirrhosis of liver: Secondary | ICD-10-CM

## 2018-12-22 NOTE — Telephone Encounter (Signed)
Thank you :)

## 2018-12-23 ENCOUNTER — Telehealth: Payer: Self-pay

## 2018-12-23 ENCOUNTER — Encounter: Payer: Self-pay | Admitting: Internal Medicine

## 2018-12-23 ENCOUNTER — Other Ambulatory Visit: Payer: Self-pay

## 2018-12-23 ENCOUNTER — Other Ambulatory Visit: Payer: Medicaid Other

## 2018-12-23 ENCOUNTER — Ambulatory Visit (INDEPENDENT_AMBULATORY_CARE_PROVIDER_SITE_OTHER): Payer: Self-pay | Admitting: Internal Medicine

## 2018-12-23 VITALS — BP 95/57 | HR 99 | Temp 98.2°F | Ht 65.0 in | Wt 140.0 lb

## 2018-12-23 DIAGNOSIS — K769 Liver disease, unspecified: Secondary | ICD-10-CM

## 2018-12-23 DIAGNOSIS — Z794 Long term (current) use of insulin: Secondary | ICD-10-CM

## 2018-12-23 DIAGNOSIS — K754 Autoimmune hepatitis: Secondary | ICD-10-CM

## 2018-12-23 DIAGNOSIS — R188 Other ascites: Secondary | ICD-10-CM

## 2018-12-23 DIAGNOSIS — Z79899 Other long term (current) drug therapy: Secondary | ICD-10-CM

## 2018-12-23 DIAGNOSIS — E119 Type 2 diabetes mellitus without complications: Secondary | ICD-10-CM

## 2018-12-23 DIAGNOSIS — E118 Type 2 diabetes mellitus with unspecified complications: Secondary | ICD-10-CM

## 2018-12-23 LAB — GLUCOSE, CAPILLARY: Glucose-Capillary: 470 mg/dL — ABNORMAL HIGH (ref 70–99)

## 2018-12-23 LAB — POCT GLYCOSYLATED HEMOGLOBIN (HGB A1C): Hemoglobin A1C: 7.7 % — AB (ref 4.0–5.6)

## 2018-12-23 MED ORDER — INSULIN GLARGINE 100 UNIT/ML SOLOSTAR PEN: 15.0000 [IU] | PEN_INJECTOR | Freq: Every day | SUBCUTANEOUS | 0 refills | Status: AC

## 2018-12-23 NOTE — Progress Notes (Signed)
   CC: abdominal pain, hyperglycemia  HPI:  Mr.Kaylee Congrove is a 58 y.o. male with newly diagnosed AI hepatitis, confirmed on liver biopsy and T2DM who presents with 4 days of worsening abdominal distension and shortness of breath. His blood sugars have also been persistently elevated.   Please see encounter tab for full details of HPI   Past Medical History:  Diagnosis Date  . Anemia   . Cirrhosis ([place])    with ascites  . Diabetes mellitus ([place])   . Essential hypertension   . Frequent headaches   . Rotator cuff arthropathy of right shoulder     Physical Exam:  Vitals:   12/23/18 0836  BP: (!) 95/57  Pulse: 99  Temp: 98.2 F (36.8 C)  TempSrc: Oral  SpO2: 100%  Weight: 140 lb (63.5 kg)  Height: 5\' 5"  (1.651 m)   Gen: comfortable appearing, NAD Pulm: CTAB, normal wob Abd: soft, slightly distended, + fluid wave, +BS, TTP in bilateral lower quadrants without rebound or guarding. POCUS performed at bedside revealed moderate amount of ascites, no evidence of perotonitis   Assessment & Plan:   See Encounters Tab for problem based charting.  Patient seen with Dr. Evette Doffing

## 2018-12-23 NOTE — Telephone Encounter (Signed)
-----   Message from Hughie Closs, RN sent at 11/10/2018  9:03 AM EDT ----- Schedule Stool test for H-pylori neg.?  1 month after finishing 2 wk. Meds.- 01/06/2019

## 2018-12-23 NOTE — Patient Instructions (Addendum)
It was nice seeing you today. Thank you for choosing [facility] for your Primary Care.   Today we talked about:   1) Abdominal pain, shortness of breath: You have fluid building up in your belly. Please resume the two medicines you have at home (Spironolactone and Furosemide). Your pain could also be due to constipation. Please double the dose of miralax or try a different laxative like Senna.   2) Diabetes: Please increase your lantus to 15U daily    FOLLOW-UP INSTRUCTIONS When: 1 week  For: diabetes, abdominal pain What to bring: all medications  Please contact the clinic if you have any problems, or need to be seen sooner.

## 2018-12-23 NOTE — Telephone Encounter (Signed)
Called and spoke to patient's daughter ([name]). Ask her to come to lab in basement and pick-up kit for H Pylori stool test. Patient has been off Prilosec for a few weeks. Order in Athalia. Daughter states the patient had an office visit with Dr. Donne Hazel, Verdis Frederickson (internal med) today and she increased his insulin for 10 units to 15 units. States she also wants him to start back on the Lasix and Aldactone, because he is having a lot of fluid accumulation in his abdomen. Daughter agreed to come pick-up H Pylori supplies this week

## 2018-12-23 NOTE — Addendum Note (Signed)
Addended by: Lalla Brothers T on: 12/23/2018 01:36 PM   Modules accepted: Level of Service

## 2018-12-23 NOTE — Assessment & Plan Note (Signed)
He reports being told to stop taking spironolactone and furosemide 4 days ago b/c he was endorsing n/v/d. Over the last 4 days he notes worsening abdominal distension causing him to feel sob when he sits down. He also feels nauseous when he tries to eat or drink anything. Denies fevers, chills, vomiting, diarrhea and reports being constipated. +fluid wave on exam and abdominal distension but abdomen remains soft. Bedside US was performed and showed a moderate amount of ascites. No significant abdominal tenderness to indicate peritonitis. I expect his ascites is causing his pain and sob and can be treated with medical diuresis. He has not required paracentesis thus far. If pain does not improve, instructed patient to call the clinic and we can perform a therapeutic paracentesis.   - resume home spironolactone and furosemide - if ascites/symptoms are not improved, consider therapeutic paracentesis - GI f/u already scheduled next week

## 2018-12-23 NOTE — Telephone Encounter (Signed)
See phone note

## 2018-12-23 NOTE — Telephone Encounter (Signed)
-----   Message from Hughie Closs, RN sent at 11/10/2018  9:03 AM EDT ----- Schedule Stool test for H-pylori neg.?  1 month after finishing 2 wk. Meds.- 01/11/2019

## 2018-12-23 NOTE — Progress Notes (Signed)
Internal Medicine Clinic Attending  I saw and evaluated the patient.  I personally confirmed the key portions of the history and exam documented by Dr. Donne Hazel and I reviewed pertinent patient test results.  The assessment, diagnosis, and plan were formulated together and I agree with the documentation in the resident's note.  Mild decompensation of chronic liver disease with ascites buildup off diuretics. No signs of pertonitis, abdomen is soft and I do not think there is a need for therapeutic paracentesis now given only mild ascites. Likely can be medically treated by restarting lasix and spiro, close follow up in one week.

## 2018-12-23 NOTE — Assessment & Plan Note (Addendum)
Glucometer shows CBGs are consistently elevated, most often in the 200s but ranging from 184-534. a1c 7.7 from 4.8.  Reports compliance with lantus 10U qd. Metformin was discontinued several months ago when cirrhosis was diagnosed. Denies symptomatic hypoglycemia such as blurry vision, diaphoresis, dizziness. Recently started prednisone 10mg  qd for autoimmune hepatitis.    - increase lantus from 10U to 15U daily  - f/u in one week

## 2018-12-24 ENCOUNTER — Other Ambulatory Visit: Payer: Medicaid Other

## 2018-12-24 DIAGNOSIS — K7460 Unspecified cirrhosis of liver: Secondary | ICD-10-CM

## 2018-12-25 ENCOUNTER — Other Ambulatory Visit: Payer: Self-pay

## 2018-12-25 ENCOUNTER — Encounter (HOSPITAL_COMMUNITY): Payer: Self-pay

## 2018-12-25 ENCOUNTER — Observation Stay (HOSPITAL_COMMUNITY): Payer: Medicaid Other

## 2018-12-25 ENCOUNTER — Inpatient Hospital Stay (HOSPITAL_COMMUNITY)
Admission: EM | Admit: 2018-12-25 | Discharge: 2019-01-20 | DRG: 871 | Disposition: E | Payer: Medicaid Other | Attending: Pulmonary Disease | Admitting: Pulmonary Disease

## 2018-12-25 ENCOUNTER — Emergency Department (HOSPITAL_COMMUNITY): Payer: Medicaid Other

## 2018-12-25 DIAGNOSIS — K828 Other specified diseases of gallbladder: Secondary | ICD-10-CM | POA: Diagnosis present

## 2018-12-25 DIAGNOSIS — E875 Hyperkalemia: Secondary | ICD-10-CM | POA: Diagnosis present

## 2018-12-25 DIAGNOSIS — Z9289 Personal history of other medical treatment: Secondary | ICD-10-CM

## 2018-12-25 DIAGNOSIS — D696 Thrombocytopenia, unspecified: Secondary | ICD-10-CM | POA: Diagnosis present

## 2018-12-25 DIAGNOSIS — Z1159 Encounter for screening for other viral diseases: Secondary | ICD-10-CM

## 2018-12-25 DIAGNOSIS — R109 Unspecified abdominal pain: Secondary | ICD-10-CM | POA: Insufficient documentation

## 2018-12-25 DIAGNOSIS — K7460 Unspecified cirrhosis of liver: Secondary | ICD-10-CM | POA: Diagnosis present

## 2018-12-25 DIAGNOSIS — I8510 Secondary esophageal varices without bleeding: Secondary | ICD-10-CM | POA: Diagnosis present

## 2018-12-25 DIAGNOSIS — E1122 Type 2 diabetes mellitus with diabetic chronic kidney disease: Secondary | ICD-10-CM | POA: Diagnosis present

## 2018-12-25 DIAGNOSIS — Z79899 Other long term (current) drug therapy: Secondary | ICD-10-CM

## 2018-12-25 DIAGNOSIS — A419 Sepsis, unspecified organism: Principal | ICD-10-CM | POA: Diagnosis present

## 2018-12-25 DIAGNOSIS — J9601 Acute respiratory failure with hypoxia: Secondary | ICD-10-CM

## 2018-12-25 DIAGNOSIS — K7210 Chronic hepatic failure without coma: Secondary | ICD-10-CM | POA: Diagnosis present

## 2018-12-25 DIAGNOSIS — R140 Abdominal distension (gaseous): Secondary | ICD-10-CM

## 2018-12-25 DIAGNOSIS — D684 Acquired coagulation factor deficiency: Secondary | ICD-10-CM | POA: Diagnosis present

## 2018-12-25 DIAGNOSIS — N182 Chronic kidney disease, stage 2 (mild): Secondary | ICD-10-CM | POA: Diagnosis present

## 2018-12-25 DIAGNOSIS — K754 Autoimmune hepatitis: Secondary | ICD-10-CM | POA: Diagnosis present

## 2018-12-25 DIAGNOSIS — Z66 Do not resuscitate: Secondary | ICD-10-CM | POA: Diagnosis not present

## 2018-12-25 DIAGNOSIS — D509 Iron deficiency anemia, unspecified: Secondary | ICD-10-CM | POA: Diagnosis present

## 2018-12-25 DIAGNOSIS — I313 Pericardial effusion (noninflammatory): Secondary | ICD-10-CM | POA: Diagnosis present

## 2018-12-25 DIAGNOSIS — E440 Moderate protein-calorie malnutrition: Secondary | ICD-10-CM | POA: Diagnosis present

## 2018-12-25 DIAGNOSIS — G9341 Metabolic encephalopathy: Secondary | ICD-10-CM | POA: Diagnosis not present

## 2018-12-25 DIAGNOSIS — K766 Portal hypertension: Secondary | ICD-10-CM | POA: Diagnosis present

## 2018-12-25 DIAGNOSIS — E119 Type 2 diabetes mellitus without complications: Secondary | ICD-10-CM

## 2018-12-25 DIAGNOSIS — Z4659 Encounter for fitting and adjustment of other gastrointestinal appliance and device: Secondary | ICD-10-CM

## 2018-12-25 DIAGNOSIS — K767 Hepatorenal syndrome: Secondary | ICD-10-CM | POA: Diagnosis present

## 2018-12-25 DIAGNOSIS — Z781 Physical restraint status: Secondary | ICD-10-CM

## 2018-12-25 DIAGNOSIS — E871 Hypo-osmolality and hyponatremia: Secondary | ICD-10-CM | POA: Diagnosis present

## 2018-12-25 DIAGNOSIS — K56609 Unspecified intestinal obstruction, unspecified as to partial versus complete obstruction: Secondary | ICD-10-CM

## 2018-12-25 DIAGNOSIS — R7689 Other specified abnormal immunological findings in serum: Secondary | ICD-10-CM | POA: Diagnosis present

## 2018-12-25 DIAGNOSIS — I8500 Esophageal varices without bleeding: Secondary | ICD-10-CM | POA: Diagnosis present

## 2018-12-25 DIAGNOSIS — R6521 Severe sepsis with septic shock: Secondary | ICD-10-CM | POA: Diagnosis present

## 2018-12-25 DIAGNOSIS — Z1624 Resistance to multiple antibiotics: Secondary | ICD-10-CM | POA: Diagnosis present

## 2018-12-25 DIAGNOSIS — Z515 Encounter for palliative care: Secondary | ICD-10-CM

## 2018-12-25 DIAGNOSIS — E11649 Type 2 diabetes mellitus with hypoglycemia without coma: Secondary | ICD-10-CM | POA: Diagnosis not present

## 2018-12-25 DIAGNOSIS — D6959 Other secondary thrombocytopenia: Secondary | ICD-10-CM | POA: Diagnosis present

## 2018-12-25 DIAGNOSIS — E8770 Fluid overload, unspecified: Secondary | ICD-10-CM | POA: Diagnosis present

## 2018-12-25 DIAGNOSIS — R188 Other ascites: Secondary | ICD-10-CM | POA: Diagnosis present

## 2018-12-25 DIAGNOSIS — B181 Chronic viral hepatitis B without delta-agent: Secondary | ICD-10-CM | POA: Diagnosis present

## 2018-12-25 DIAGNOSIS — R7989 Other specified abnormal findings of blood chemistry: Secondary | ICD-10-CM

## 2018-12-25 DIAGNOSIS — Z87891 Personal history of nicotine dependence: Secondary | ICD-10-CM

## 2018-12-25 DIAGNOSIS — R1084 Generalized abdominal pain: Secondary | ICD-10-CM

## 2018-12-25 DIAGNOSIS — D892 Hypergammaglobulinemia, unspecified: Secondary | ICD-10-CM | POA: Diagnosis present

## 2018-12-25 DIAGNOSIS — R579 Shock, unspecified: Secondary | ICD-10-CM

## 2018-12-25 DIAGNOSIS — I4891 Unspecified atrial fibrillation: Secondary | ICD-10-CM | POA: Diagnosis not present

## 2018-12-25 DIAGNOSIS — K567 Ileus, unspecified: Secondary | ICD-10-CM | POA: Diagnosis not present

## 2018-12-25 DIAGNOSIS — R34 Anuria and oliguria: Secondary | ICD-10-CM | POA: Diagnosis not present

## 2018-12-25 DIAGNOSIS — I129 Hypertensive chronic kidney disease with stage 1 through stage 4 chronic kidney disease, or unspecified chronic kidney disease: Secondary | ICD-10-CM | POA: Diagnosis present

## 2018-12-25 DIAGNOSIS — D631 Anemia in chronic kidney disease: Secondary | ICD-10-CM | POA: Diagnosis present

## 2018-12-25 DIAGNOSIS — N179 Acute kidney failure, unspecified: Secondary | ICD-10-CM | POA: Diagnosis present

## 2018-12-25 DIAGNOSIS — Z794 Long term (current) use of insulin: Secondary | ICD-10-CM

## 2018-12-25 DIAGNOSIS — R1010 Upper abdominal pain, unspecified: Secondary | ICD-10-CM

## 2018-12-25 DIAGNOSIS — I10 Essential (primary) hypertension: Secondary | ICD-10-CM | POA: Diagnosis present

## 2018-12-25 DIAGNOSIS — K7200 Acute and subacute hepatic failure without coma: Secondary | ICD-10-CM | POA: Diagnosis present

## 2018-12-25 DIAGNOSIS — D6489 Other specified anemias: Secondary | ICD-10-CM | POA: Diagnosis present

## 2018-12-25 DIAGNOSIS — B9620 Unspecified Escherichia coli [E. coli] as the cause of diseases classified elsewhere: Secondary | ICD-10-CM | POA: Diagnosis present

## 2018-12-25 DIAGNOSIS — B961 Klebsiella pneumoniae [K. pneumoniae] as the cause of diseases classified elsewhere: Secondary | ICD-10-CM | POA: Diagnosis present

## 2018-12-25 DIAGNOSIS — E872 Acidosis: Secondary | ICD-10-CM | POA: Diagnosis present

## 2018-12-25 DIAGNOSIS — R768 Other specified abnormal immunological findings in serum: Secondary | ICD-10-CM | POA: Diagnosis present

## 2018-12-25 DIAGNOSIS — Z6826 Body mass index (BMI) 26.0-26.9, adult: Secondary | ICD-10-CM

## 2018-12-25 DIAGNOSIS — R278 Other lack of coordination: Secondary | ICD-10-CM | POA: Diagnosis not present

## 2018-12-25 DIAGNOSIS — E1165 Type 2 diabetes mellitus with hyperglycemia: Secondary | ICD-10-CM | POA: Diagnosis not present

## 2018-12-25 DIAGNOSIS — K652 Spontaneous bacterial peritonitis: Secondary | ICD-10-CM | POA: Diagnosis present

## 2018-12-25 LAB — GRAM STAIN

## 2018-12-25 LAB — CBC WITH DIFFERENTIAL/PLATELET
Abs Immature Granulocytes: 0.43 10*3/uL — ABNORMAL HIGH (ref 0.00–0.07)
Basophils Absolute: 0 10*3/uL (ref 0.0–0.1)
Basophils Relative: 0 %
Eosinophils Absolute: 0 10*3/uL (ref 0.0–0.5)
Eosinophils Relative: 0 %
HCT: 28.8 % — ABNORMAL LOW (ref 39.0–52.0)
Hemoglobin: 9.7 g/dL — ABNORMAL LOW (ref 13.0–17.0)
Immature Granulocytes: 2 %
Lymphocytes Relative: 4 %
Lymphs Abs: 1.2 10*3/uL (ref 0.7–4.0)
MCH: 25.7 pg — ABNORMAL LOW (ref 26.0–34.0)
MCHC: 33.7 g/dL (ref 30.0–36.0)
MCV: 76.2 fL — ABNORMAL LOW (ref 80.0–100.0)
Monocytes Absolute: 2.2 10*3/uL — ABNORMAL HIGH (ref 0.1–1.0)
Monocytes Relative: 8 %
Neutro Abs: 23 10*3/uL — ABNORMAL HIGH (ref 1.7–7.7)
Neutrophils Relative %: 86 %
Platelets: 75 10*3/uL — ABNORMAL LOW (ref 150–400)
RBC: 3.78 MIL/uL — ABNORMAL LOW (ref 4.22–5.81)
RDW: 17.9 % — ABNORMAL HIGH (ref 11.5–15.5)
WBC: 26.9 10*3/uL — ABNORMAL HIGH (ref 4.0–10.5)
nRBC: 0 % (ref 0.0–0.2)

## 2018-12-25 LAB — HELICOBACTER PYLORI  SPECIAL ANTIGEN
MICRO NUMBER:: 537596
SPECIMEN QUALITY: ADEQUATE

## 2018-12-25 LAB — BODY FLUID CELL COUNT WITH DIFFERENTIAL
Eos, Fluid: 0 %
Lymphs, Fluid: 1 %
Monocyte-Macrophage-Serous Fluid: 10 % — ABNORMAL LOW (ref 50–90)
Neutrophil Count, Fluid: 89 % — ABNORMAL HIGH (ref 0–25)
Total Nucleated Cell Count, Fluid: 21725 cu mm — ABNORMAL HIGH (ref 0–1000)

## 2018-12-25 LAB — COMPREHENSIVE METABOLIC PANEL
ALT: 29 U/L (ref 0–44)
AST: 27 U/L (ref 15–41)
Albumin: 2.1 g/dL — ABNORMAL LOW (ref 3.5–5.0)
Alkaline Phosphatase: 53 U/L (ref 38–126)
Anion gap: 10 (ref 5–15)
BUN: 24 mg/dL — ABNORMAL HIGH (ref 6–20)
CO2: 22 mmol/L (ref 22–32)
Calcium: 8 mg/dL — ABNORMAL LOW (ref 8.9–10.3)
Chloride: 90 mmol/L — ABNORMAL LOW (ref 98–111)
Creatinine, Ser: 1.24 mg/dL (ref 0.61–1.24)
GFR calc Af Amer: >60 mL/min (ref >60)
GFR calc non Af Amer: >60 mL/min (ref >60)
Glucose, Bld: 287 mg/dL — ABNORMAL HIGH (ref 70–99)
Potassium: 4.5 mmol/L (ref 3.5–5.1)
Sodium: 122 mmol/L — ABNORMAL LOW (ref 135–145)
Total Bilirubin: 9.5 mg/dL — ABNORMAL HIGH (ref 0.3–1.2)
Total Protein: 7.1 g/dL (ref 6.5–8.1)

## 2018-12-25 LAB — PROTIME-INR
INR: 2 — ABNORMAL HIGH (ref 0.8–1.2)
Prothrombin Time: 22.3 seconds — ABNORMAL HIGH (ref 11.4–15.2)

## 2018-12-25 LAB — URINALYSIS, ROUTINE W REFLEX MICROSCOPIC
Glucose, UA: >=500 mg/dL — AB
Hgb urine dipstick: NEGATIVE
Ketones, ur: NEGATIVE mg/dL
Leukocytes,Ua: NEGATIVE
Nitrite: NEGATIVE
Protein, ur: NEGATIVE mg/dL
Specific Gravity, Urine: 1.021 (ref 1.005–1.030)
pH: 6 (ref 5.0–8.0)

## 2018-12-25 LAB — BASIC METABOLIC PANEL
Anion gap: 7 (ref 5–15)
BUN: 22 mg/dL — ABNORMAL HIGH (ref 6–20)
CO2: 23 mmol/L (ref 22–32)
Calcium: 8.2 mg/dL — ABNORMAL LOW (ref 8.9–10.3)
Chloride: 96 mmol/L — ABNORMAL LOW (ref 98–111)
Creatinine, Ser: 0.92 mg/dL (ref 0.61–1.24)
GFR calc Af Amer: >60 mL/min (ref >60)
GFR calc non Af Amer: >60 mL/min (ref >60)
Glucose, Bld: 140 mg/dL — ABNORMAL HIGH (ref 70–99)
Potassium: 4.5 mmol/L (ref 3.5–5.1)
Sodium: 126 mmol/L — ABNORMAL LOW (ref 135–145)

## 2018-12-25 LAB — PROTEIN, PLEURAL OR PERITONEAL FLUID: Total protein, fluid: <3 g/dL

## 2018-12-25 LAB — GLUCOSE, CAPILLARY
Glucose-Capillary: 150 mg/dL — ABNORMAL HIGH (ref 70–99)
Glucose-Capillary: 131 mg/dL — ABNORMAL HIGH (ref 70–99)
Glucose-Capillary: 83 mg/dL (ref 70–99)

## 2018-12-25 LAB — LACTIC ACID, PLASMA
Lactic Acid, Venous: 6.4 mmol/L (ref 0.5–1.9)
Lactic Acid, Venous: 5.1 mmol/L (ref 0.5–1.9)

## 2018-12-25 LAB — BILIRUBIN, FRACTIONATED(TOT/DIR/INDIR)
Bilirubin, Direct: 4.9 mg/dL — ABNORMAL HIGH (ref 0.0–0.2)
Indirect Bilirubin: 3.5 mg/dL — ABNORMAL HIGH (ref 0.3–0.9)
Total Bilirubin: 8.4 mg/dL — ABNORMAL HIGH (ref 0.3–1.2)

## 2018-12-25 LAB — GLUCOSE, PLEURAL OR PERITONEAL FLUID: Glucose, Fluid: 174 mg/dL

## 2018-12-25 LAB — SARS CORONAVIRUS 2 BY RT PCR (HOSPITAL ORDER, PERFORMED IN ~~LOC~~ HOSPITAL LAB): SARS Coronavirus 2: NEGATIVE

## 2018-12-25 LAB — TROPONIN I: Troponin I: <0.03 ng/mL (ref <0.03)

## 2018-12-25 LAB — LIPASE, BLOOD: Lipase: 38 U/L (ref 11–51)

## 2018-12-25 MED ORDER — INSULIN ASPART 100 UNIT/ML ~~LOC~~ SOLN
0.0000 [IU] | Freq: Three times a day (TID) | SUBCUTANEOUS | Status: DC
  Administered 2018-12-25: 1 [IU] via SUBCUTANEOUS
  Administered 2018-12-26 – 2018-12-27 (×5): 2 [IU] via SUBCUTANEOUS
  Administered 2018-12-28: 9 [IU] via SUBCUTANEOUS
  Administered 2018-12-28: 3 [IU] via SUBCUTANEOUS
  Administered 2018-12-28: 9 [IU] via SUBCUTANEOUS
  Administered 2018-12-29: 1 [IU] via SUBCUTANEOUS
  Administered 2018-12-29: 3 [IU] via SUBCUTANEOUS
  Administered 2018-12-29: 2 [IU] via SUBCUTANEOUS

## 2018-12-25 MED ORDER — POLYETHYLENE GLYCOL 3350 17 G PO PACK
17.0000 g | PACK | Freq: Two times a day (BID) | ORAL | Status: DC
  Administered 2018-12-25 (×2): 17 g via ORAL
  Filled 2018-12-25 (×2): qty 1

## 2018-12-25 MED ORDER — ALBUMIN HUMAN 25 % IV SOLN
100.0000 g | Freq: Once | INTRAVENOUS | Status: AC
  Administered 2018-12-25: 100 g via INTRAVENOUS
  Filled 2018-12-25: qty 400

## 2018-12-25 MED ORDER — HYDROMORPHONE HCL 1 MG/ML IJ SOLN
1.0000 mg | INTRAMUSCULAR | Status: DC | PRN
  Administered 2018-12-25 – 2018-12-30 (×9): 1 mg via INTRAVENOUS
  Filled 2018-12-25 (×9): qty 1

## 2018-12-25 MED ORDER — SPIRONOLACTONE 25 MG PO TABS
50.0000 mg | ORAL_TABLET | Freq: Every day | ORAL | Status: DC
  Administered 2018-12-25: 50 mg via ORAL
  Filled 2018-12-25: qty 2

## 2018-12-25 MED ORDER — MORPHINE SULFATE (PF) 4 MG/ML IV SOLN
4.0000 mg | Freq: Once | INTRAVENOUS | Status: AC
  Administered 2018-12-25: 4 mg via INTRAVENOUS
  Filled 2018-12-25: qty 1

## 2018-12-25 MED ORDER — MORPHINE SULFATE 15 MG PO TABS
15.0000 mg | ORAL_TABLET | ORAL | Status: DC | PRN
  Administered 2018-12-25: 15 mg via ORAL
  Filled 2018-12-25: qty 1

## 2018-12-25 MED ORDER — ONDANSETRON HCL 4 MG PO TABS: 4.0000 mg | ORAL_TABLET | Freq: Four times a day (QID) | ORAL | Status: DC | PRN

## 2018-12-25 MED ORDER — SODIUM CHLORIDE 0.9 % IV SOLN
1.0000 g | Freq: Once | INTRAVENOUS | Status: AC
  Administered 2018-12-25: 1 g via INTRAVENOUS
  Filled 2018-12-25: qty 10

## 2018-12-25 MED ORDER — IOHEXOL 300 MG/ML  SOLN
100.0000 mL | Freq: Once | INTRAMUSCULAR | Status: AC | PRN
  Administered 2018-12-25: 100 mL via INTRAVENOUS

## 2018-12-25 MED ORDER — SODIUM CHLORIDE 0.9 % IV SOLN
1.0000 g | INTRAVENOUS | Status: AC
  Administered 2018-12-25: 1 g via INTRAVENOUS
  Filled 2018-12-25: qty 1

## 2018-12-25 MED ORDER — INSULIN GLARGINE 100 UNIT/ML ~~LOC~~ SOLN
15.0000 [IU] | Freq: Every day | SUBCUTANEOUS | Status: DC
  Administered 2018-12-25: 15 [IU] via SUBCUTANEOUS
  Filled 2018-12-25: qty 0.15

## 2018-12-25 MED ORDER — LACTATED RINGERS IV BOLUS
1000.0000 mL | Freq: Once | INTRAVENOUS | Status: AC
  Administered 2018-12-25: 1000 mL via INTRAVENOUS

## 2018-12-25 MED ORDER — ACETAMINOPHEN 325 MG PO TABS: 650.0000 mg | ORAL_TABLET | Freq: Four times a day (QID) | ORAL | Status: DC | PRN

## 2018-12-25 MED ORDER — PANTOPRAZOLE SODIUM 40 MG PO TBEC
40.0000 mg | DELAYED_RELEASE_TABLET | Freq: Every day | ORAL | Status: DC
  Administered 2018-12-25 – 2018-12-29 (×4): 40 mg via ORAL
  Filled 2018-12-25 (×4): qty 1

## 2018-12-25 MED ORDER — INSULIN ASPART 100 UNIT/ML ~~LOC~~ SOLN: 0.0000 [IU] | Freq: Every day | SUBCUTANEOUS | Status: DC

## 2018-12-25 MED ORDER — ONDANSETRON HCL 4 MG/2ML IJ SOLN
4.0000 mg | Freq: Four times a day (QID) | INTRAMUSCULAR | Status: DC | PRN
  Administered 2018-12-27: 4 mg via INTRAVENOUS
  Filled 2018-12-25 (×2): qty 2

## 2018-12-25 MED ORDER — SODIUM CHLORIDE 0.9 % IV SOLN
2.0000 g | INTRAVENOUS | Status: DC
  Administered 2018-12-26 – 2018-12-27 (×2): 2 g via INTRAVENOUS
  Filled 2018-12-25 (×2): qty 2

## 2018-12-25 MED ORDER — PROPRANOLOL HCL 20 MG PO TABS
20.0000 mg | ORAL_TABLET | Freq: Two times a day (BID) | ORAL | Status: DC
  Administered 2018-12-25: 20 mg via ORAL
  Filled 2018-12-25: qty 1

## 2018-12-25 MED ORDER — LIDOCAINE HCL 1 % IJ SOLN
INTRAMUSCULAR | Status: AC
  Filled 2018-12-25: qty 10

## 2018-12-25 MED ORDER — SODIUM CHLORIDE (PF) 0.9 % IJ SOLN
INTRAMUSCULAR | Status: AC
  Filled 2018-12-25: qty 50

## 2018-12-25 MED ORDER — ONDANSETRON HCL 4 MG/2ML IJ SOLN
4.0000 mg | Freq: Once | INTRAMUSCULAR | Status: AC
  Administered 2018-12-25: 4 mg via INTRAVENOUS
  Filled 2018-12-25: qty 2

## 2018-12-25 MED ORDER — ALBUMIN HUMAN 25 % IV SOLN
65.0000 g | Freq: Once | INTRAVENOUS | Status: DC
  Filled 2018-12-25 (×2): qty 300

## 2018-12-25 MED ORDER — PREDNISONE 10 MG PO TABS: 10.0000 mg | ORAL_TABLET | Freq: Every day | ORAL | Status: DC

## 2018-12-25 MED ORDER — ENTECAVIR 0.5 MG PO TABS
0.5000 mg | ORAL_TABLET | Freq: Every day | ORAL | Status: DC
  Filled 2018-12-25: qty 1

## 2018-12-25 MED ORDER — ACETAMINOPHEN 650 MG RE SUPP: 650.0000 mg | Freq: Four times a day (QID) | RECTAL | Status: DC | PRN

## 2018-12-25 NOTE — Progress Notes (Signed)
Pharmacy Brief Note:  Pharmacy consulted to change ABX to cefotaxime if available. Otherwise instructed to continue ceftriaxone.   Cefotaxime not available - on backorder. Will continue ceftriaxone 2 g IV daily.   Lenis Noon, PharmD 01/05/2019 7:31 PM

## 2018-12-25 NOTE — ED Notes (Signed)
ED TO INPATIENT HANDOFF REPORT  Name/Age/Gender Michael Hamilton 58 y.o. male  Code Status   Home/SNF/Other Home  Chief Complaint Abdominal Pain  Level of Care/Admitting Diagnosis ED Disposition    ED Disposition Condition [facility]: [place] [[id number]]  Level of Care: Med-Surg [16]  Covid Evaluation: Screening Protocol (No Symptoms)  Diagnosis: Abdominal pain [709628]  Admitting Physician: RAI, [place] K [[id number]]  Attending Physician: RAI, RIPUDEEP K [[id number]]  PT Class (Do Not Modify): Observation [104]  PT Acc Code (Do Not Modify): Observation [10022]       Medical History Past Medical History:  Diagnosis Date  . Anemia   . Cirrhosis ([place])    with ascites  . Diabetes mellitus ([place])   . Essential hypertension   . Frequent headaches   . Rotator cuff arthropathy of right shoulder     Allergies No Known Allergies  IV Location/Drains/Wounds Patient Lines/Drains/Airways Status   Active Line/Drains/Airways    Name:   Placement date:   Placement time:   Site:   Days:   Peripheral IV 12/30/2018 Right Arm   01/19/2019    0641    Arm   less than 1   Peripheral IV 01/09/2019 Right Wrist   01/09/2019    0802    Wrist   less than 1   Incision (Closed) 11/16/18 Neck Right   11/16/18    1215     39          Labs/Imaging Results for orders placed or performed during the hospital encounter of 01/08/2019 (from the past 48 hour(s))  Urinalysis, Routine w reflex microscopic     Status: Abnormal   Collection Time: 12/26/2018  6:24 AM  Result Value Ref Range   Color, Urine AMBER (A) YELLOW    Comment: BIOCHEMICALS MAY BE AFFECTED BY COLOR   APPearance CLEAR CLEAR   Specific Gravity, Urine 1.021 1.005 - 1.030   pH 6.0 5.0 - 8.0   Glucose, UA >=500 (A) NEGATIVE mg/dL   Hgb urine dipstick NEGATIVE NEGATIVE   Bilirubin Urine SMALL (A) NEGATIVE   Ketones, ur NEGATIVE NEGATIVE mg/dL   Protein, ur NEGATIVE NEGATIVE mg/dL   Nitrite NEGATIVE NEGATIVE    Leukocytes,Ua NEGATIVE NEGATIVE   RBC / HPF 0-5 0 - 5 RBC/hpf   WBC, UA 0-5 0 - 5 WBC/hpf   Bacteria, UA RARE (A) NONE SEEN   Hyaline Casts, UA PRESENT     Comment: Performed at [facility], [place] [address], [place], [place] [address]  SARS Coronavirus 2 (CEPHEID - Performed in [facility] hospital lab), Hosp Order     Status: None   Collection Time: 12/29/2018  6:42 AM  Result Value Ref Range   SARS Coronavirus 2 NEGATIVE NEGATIVE    Comment: (NOTE) If result is NEGATIVE SARS-CoV-2 target nucleic acids are NOT DETECTED. The SARS-CoV-2 RNA is generally detectable in upper and lower  respiratory specimens during the acute phase of infection. The lowest  concentration of SARS-CoV-2 viral copies this assay can detect is 250  copies / mL. A negative result does not preclude SARS-CoV-2 infection  and should not be used as the sole basis for treatment or other  patient management decisions.  A negative result may occur with  improper specimen collection / handling, submission of specimen other  than nasopharyngeal swab, presence of viral mutation(s) within the  areas targeted by this assay, and inadequate number of viral copies  (<250 copies /  mL). A negative result must be combined with clinical  observations, patient history, and epidemiological information. If result is POSITIVE SARS-CoV-2 target nucleic acids are DETECTED. The SARS-CoV-2 RNA is generally detectable in upper and lower  respiratory specimens dur ing the acute phase of infection.  Positive  results are indicative of active infection with SARS-CoV-2.  Clinical  correlation with patient history and other diagnostic information is  necessary to determine patient infection status.  Positive results do  not rule out bacterial infection or co-infection with other viruses. If result is PRESUMPTIVE POSTIVE SARS-CoV-2 nucleic acids MAY BE PRESENT.   A presumptive positive result was obtained on the submitted  specimen  and confirmed on repeat testing.  While 2019 novel coronavirus  (SARS-CoV-2) nucleic acids may be present in the submitted sample  additional confirmatory testing may be necessary for epidemiological  and / or clinical management purposes  to differentiate between  SARS-CoV-2 and other Sarbecovirus currently known to infect humans.  If clinically indicated additional testing with an alternate test  methodology [phone]) is advised. The SARS-CoV-2 RNA is generally  detectable in upper and lower respiratory sp ecimens during the acute  phase of infection. The expected result is Negative. Fact Sheet for Patients:  StrictlyIdeas.no Fact Sheet for Healthcare Providers: BankingDealers.co.za This test is not yet approved or cleared by the [place] FDA and has been authorized for detection and/or diagnosis of SARS-CoV-2 by FDA under an Emergency Use Authorization (EUA).  This EUA will remain in effect (meaning this test can be used) for the duration of the COVID-19 declaration under Section 564(b)(1) of the Act, 21 U.S.C. section 360bbb-3(b)(1), unless the authorization is terminated or revoked sooner. Performed at [facility], [place] [address], [place], [place] [address]   CBC with Differential     Status: Abnormal   Collection Time: 01/19/2019  6:43 AM  Result Value Ref Range   WBC 26.9 (H) 4.0 - 10.5 K/uL   RBC 3.78 (L) 4.22 - 5.81 MIL/uL   Hemoglobin 9.7 (L) 13.0 - 17.0 g/dL   HCT 28.8 (L) 39.0 - 52.0 %   MCV 76.2 (L) 80.0 - 100.0 fL   MCH 25.7 (L) 26.0 - 34.0 pg   MCHC 33.7 30.0 - 36.0 g/dL   RDW 17.9 (H) 11.5 - 15.5 %   Platelets 75 (L) 150 - 400 K/uL    Comment: REPEATED TO VERIFY PLATELET COUNT CONFIRMED BY SMEAR SPECIMEN CHECKED FOR CLOTS Immature Platelet Fraction may be clinically indicated, consider ordering this additional test [id number]    nRBC 0.0 0.0 - 0.2 %   Neutrophils Relative % 86 %    Neutro Abs 23.0 (H) 1.7 - 7.7 K/uL   Lymphocytes Relative 4 %   Lymphs Abs 1.2 0.7 - 4.0 K/uL   Monocytes Relative 8 %   Monocytes Absolute 2.2 (H) 0.1 - 1.0 K/uL   Eosinophils Relative 0 %   Eosinophils Absolute 0.0 0.0 - 0.5 K/uL   Basophils Relative 0 %   Basophils Absolute 0.0 0.0 - 0.1 K/uL   WBC Morphology VACUOLATED NEUTROPHILS    Immature Granulocytes 2 %   Abs Immature Granulocytes 0.43 (H) 0.00 - 0.07 K/uL   Polychromasia PRESENT    Target Cells PRESENT     Comment: Performed at [facility], [place] [address] [place], [place] [address]  Comprehensive metabolic panel     Status: Abnormal   Collection Time: 12/22/2018  6:43 AM  Result Value Ref Range  Sodium 122 (L) 135 - 145 mmol/L   Potassium 4.5 3.5 - 5.1 mmol/L   Chloride 90 (L) 98 - 111 mmol/L   CO2 22 22 - 32 mmol/L   Glucose, Bld 287 (H) 70 - 99 mg/dL   BUN 24 (H) 6 - 20 mg/dL   Creatinine, Ser 1.24 0.61 - 1.24 mg/dL   Calcium 8.0 (L) 8.9 - 10.3 mg/dL   Total Protein 7.1 6.5 - 8.1 g/dL   Albumin 2.1 (L) 3.5 - 5.0 g/dL   AST 27 15 - 41 U/L   ALT 29 0 - 44 U/L   Alkaline Phosphatase 53 38 - 126 U/L   Total Bilirubin 9.5 (H) 0.3 - 1.2 mg/dL   GFR calc non Af Amer >60 >60 mL/min   GFR calc Af Amer >60 >60 mL/min   Anion gap 10 5 - 15    Comment: Performed at [facility], [place] [address], [place], [place] [address]  Lipase, blood     Status: None   Collection Time: 12/29/2018  6:43 AM  Result Value Ref Range   Lipase 38 11 - 51 U/L    Comment: Performed at [facility], [place] [address] [place], [place] [address]  Protime-INR     Status: Abnormal   Collection Time: 01/07/2019  6:43 AM  Result Value Ref Range   Prothrombin Time 22.3 (H) 11.4 - 15.2 seconds   INR 2.0 (H) 0.8 - 1.2    Comment: (NOTE) INR goal varies based on device and disease states. Performed at [facility], [place] [address], [place], [place] [address]   Troponin I -  ONCE - STAT     Status: None   Collection Time: 01/03/2019  6:44 AM  Result Value Ref Range   Troponin I <0.03 <0.03 ng/mL    Comment: Performed at [facility], [place] [address] [place], [place] [address]  Lactic acid, plasma     Status: Abnormal   Collection Time: 12/30/2018  6:44 AM  Result Value Ref Range   Lactic Acid, Venous 6.4 (HH) 0.5 - 1.9 mmol/L    Comment: CRITICAL RESULT CALLED TO, READ BACK BY AND VERIFIED WITH: K.ZULETA NR AT 0721 ON 12/30/2018 BY S.VANHOORNE Performed at [facility], [place] [address] [place], [place] [address]   Lactic acid, plasma     Status: Abnormal   Collection Time: 01/11/2019 10:03 AM  Result Value Ref Range   Lactic Acid, Venous 5.1 (HH) 0.5 - 1.9 mmol/L    Comment: CRITICAL RESULT CALLED TO, READ BACK BY AND VERIFIED WITH: S.WEST RN AT 1049 ON 01/10/2019 BY S.VANHOORNE Performed at [facility], [place] [address], [place], [place] [address]    Dg Chest 2 View  Result Date: 01/05/2019 CLINICAL DATA:  Shortness of breath EXAM: CHEST - 2 VIEW COMPARISON:  12/13/2018 FINDINGS: Low volume chest with streaky opacity at the bases. Normal heart size when accounting for lung volumes. No Kerley lines, effusion, or pneumothorax. IMPRESSION: Low volume chest with atelectasis. Electronically Signed   By: Monte Fantasia M.D.   On: 01/04/2019 06:22   Ct Abdomen Pelvis W Contrast  Result Date: 01/16/2019 CLINICAL DATA:  Abdominal pain EXAM: CT ABDOMEN AND PELVIS WITH CONTRAST TECHNIQUE: Multidetector CT imaging of the abdomen and pelvis was performed using the standard protocol following bolus administration of intravenous contrast. CONTRAST:  149mL OMNIPAQUE IOHEXOL 300 MG/ML  SOLN COMPARISON:  12/13/2018 FINDINGS: Lower chest: Trace bilateral effusions.  Heart is normal size. Hepatobiliary: Cirrhosis with nodular shrunken liver. Gallbladder is distended. No visible stones. No biliary ductal dilatation. Pancreas: No focal  abnormality or ductal dilatation. Spleen: Splenomegaly with a craniocaudal length 18 cm. No focal abnormality. Adrenals/Urinary Tract: No adrenal abnormality. No focal renal abnormality. No stones or hydronephrosis. Urinary bladder is unremarkable. Stomach/Bowel: Stomach, large and small bowel grossly unremarkable. Vascular/Lymphatic: Aortic atherosclerosis. No enlarged abdominal or pelvic lymph nodes. Reproductive: No focal abnormality. Other: Large volume ascites in the abdomen and pelvis. This is increased since prior study. Musculoskeletal: No acute bony abnormality. IMPRESSION: Cirrhosis with associated splenomegaly and enlarging large abdominal and pelvic ascites. Trace bilateral pleural effusions. Aortic atherosclerosis. Electronically Signed   By: Rolm Baptise M.D.   On: 12/27/2018 09:47    Pending Labs Unresulted Labs (From admission, onward)    Start     Ordered   01/09/2019 1055  C difficile quick scan w PCR reflex  (C Difficile quick screen w PCR reflex panel)  Once, for 24 hours,   R     01/09/2019 1054   12/30/2018 0732  Culture, blood (routine x 2)  BLOOD CULTURE X 2,   STAT     01/17/2019 0731   Signed and Held  Urine culture  Once,   R     Signed and Held   Signed and Held  Basic metabolic panel  Tomorrow morning,   R     Signed and Held   Visual merchandiser and Held  CBC  Tomorrow morning,   R     Signed and Held   Signed and Held  Basic metabolic panel  ONCE - STAT,   R     Signed and Held   Signed and Held  Hemoglobin A1c  Once,   R    Comments:  To assess prior glycemic control    Signed and Held          Vitals/Pain Today's Vitals   01/18/2019 0900 01/01/2019 0937 01/06/2019 1000 01/12/2019 1115  BP: 106/77  112/78 117/79  Pulse: 93  97 (!) 101  Resp: 18  15 18   Temp:      TempSrc:      SpO2: 98%  96% 97%  PainSc:  7       Isolation Precautions Enteric precautions (UV disinfection)  Medications Medications  sodium chloride (PF) 0.9 % injection (has no administration in time  range)  lidocaine (XYLOCAINE) 1 % (with pres) injection (has no administration in time range)  morphine 4 MG/ML injection 4 mg (4 mg Intravenous Given 12/27/2018 0648)  ondansetron (ZOFRAN) injection 4 mg (4 mg Intravenous Given 01/10/2019 0647)  cefTRIAXone (ROCEPHIN) 1 g in sodium chloride 0.9 % 100 mL IVPB (0 g Intravenous Stopped 12/27/2018 0835)  lactated ringers bolus 1,000 mL (0 mLs Intravenous Stopped 01/17/2019 0937)  morphine 4 MG/ML injection 4 mg (4 mg Intravenous Given 12/27/2018 0831)  iohexol (OMNIPAQUE) 300 MG/ML solution 100 mL (100 mLs Intravenous Contrast Given 12/21/2018 0914)  lactated ringers bolus 1,000 mL (1,000 mLs Intravenous New Bag/Given 12/29/2018 1128)    Mobility walks

## 2018-12-25 NOTE — ED Triage Notes (Signed)
Pt was seen at [facility] for fluid, they discontinued all his meds, he complains of abdominal pain again, he has been taking his lasix with no relief.

## 2018-12-25 NOTE — ED Notes (Signed)
CT called twice to notify that pt is ready for scan with no answer.

## 2018-12-25 NOTE — ED Notes (Signed)
ED TO INPATIENT HANDOFF REPORT  Name/Age/Gender Michael Hamilton 58 y.o. male  Code Status   Home/SNF/Other Home  Chief Complaint Abdominal Pain  Level of Care/Admitting Diagnosis ED Disposition    ED Disposition Condition [facility]: [place] [100102]  Level of Care: Med-Surg [16]  Covid Evaluation: Screening Protocol (No Symptoms)  Diagnosis: Abdominal pain [465681]  Admitting Physician: RAI, [place] K [[id number]]  Attending Physician: RAI, RIPUDEEP K [[id number]]  PT Class (Do Not Modify): Observation [104]  PT Acc Code (Do Not Modify): Observation [10022]       Medical History Past Medical History:  Diagnosis Date  . Anemia   . Cirrhosis ([place])    with ascites  . Diabetes mellitus ([place])   . Essential hypertension   . Frequent headaches   . Rotator cuff arthropathy of right shoulder     Allergies No Known Allergies  IV Location/Drains/Wounds Patient Lines/Drains/Airways Status   Active Line/Drains/Airways    Name:   Placement date:   Placement time:   Site:   Days:   Peripheral IV 12/27/2018 Right Arm   01/03/2019    0641    Arm   less than 1   Peripheral IV 12/30/2018 Right Wrist   01/19/2019    0802    Wrist   less than 1   Incision (Closed) 11/16/18 Neck Right   11/16/18    1215     39          Labs/Imaging Results for orders placed or performed during the hospital encounter of 01/13/2019 (from the past 48 hour(s))  Urinalysis, Routine w reflex microscopic     Status: Abnormal   Collection Time: 01/05/2019  6:24 AM  Result Value Ref Range   Color, Urine AMBER (A) YELLOW    Comment: BIOCHEMICALS MAY BE AFFECTED BY COLOR   APPearance CLEAR CLEAR   Specific Gravity, Urine 1.021 1.005 - 1.030   pH 6.0 5.0 - 8.0   Glucose, UA >=500 (A) NEGATIVE mg/dL   Hgb urine dipstick NEGATIVE NEGATIVE   Bilirubin Urine SMALL (A) NEGATIVE   Ketones, ur NEGATIVE NEGATIVE mg/dL   Protein, ur NEGATIVE NEGATIVE mg/dL   Nitrite NEGATIVE NEGATIVE    Leukocytes,Ua NEGATIVE NEGATIVE   RBC / HPF 0-5 0 - 5 RBC/hpf   WBC, UA 0-5 0 - 5 WBC/hpf   Bacteria, UA RARE (A) NONE SEEN   Hyaline Casts, UA PRESENT     Comment: Performed at [facility], [place] [address] [place], [place] [address]  SARS Coronavirus 2 (CEPHEID - Performed in [place] hospital lab), Hosp Order     Status: None   Collection Time: 01/03/2019  6:42 AM  Result Value Ref Range   SARS Coronavirus 2 NEGATIVE NEGATIVE    Comment: (NOTE) If result is NEGATIVE SARS-CoV-2 target nucleic acids are NOT DETECTED. The SARS-CoV-2 RNA is generally detectable in upper and lower  respiratory specimens during the acute phase of infection. The lowest  concentration of SARS-CoV-2 viral copies this assay can detect is 250  copies / mL. A negative result does not preclude SARS-CoV-2 infection  and should not be used as the sole basis for treatment or other  patient management decisions.  A negative result may occur with  improper specimen collection / handling, submission of specimen other  than nasopharyngeal swab, presence of viral mutation(s) within the  areas targeted by this assay, and inadequate number of viral copies  (<250 copies /  mL). A negative result must be combined with clinical  observations, patient history, and epidemiological information. If result is POSITIVE SARS-CoV-2 target nucleic acids are DETECTED. The SARS-CoV-2 RNA is generally detectable in upper and lower  respiratory specimens dur ing the acute phase of infection.  Positive  results are indicative of active infection with SARS-CoV-2.  Clinical  correlation with patient history and other diagnostic information is  necessary to determine patient infection status.  Positive results do  not rule out bacterial infection or co-infection with other viruses. If result is PRESUMPTIVE POSTIVE SARS-CoV-2 nucleic acids MAY BE PRESENT.   A presumptive positive result was obtained on the submitted  specimen  and confirmed on repeat testing.  While 2019 novel coronavirus  (SARS-CoV-2) nucleic acids may be present in the submitted sample  additional confirmatory testing may be necessary for epidemiological  and / or clinical management purposes  to differentiate between  SARS-CoV-2 and other Sarbecovirus currently known to infect humans.  If clinically indicated additional testing with an alternate test  methodology [phone]) is advised. The SARS-CoV-2 RNA is generally  detectable in upper and lower respiratory sp ecimens during the acute  phase of infection. The expected result is Negative. Fact Sheet for Patients:  StrictlyIdeas.no Fact Sheet for Healthcare Providers: BankingDealers.co.za This test is not yet approved or cleared by the [place] FDA and has been authorized for detection and/or diagnosis of SARS-CoV-2 by FDA under an Emergency Use Authorization (EUA).  This EUA will remain in effect (meaning this test can be used) for the duration of the COVID-19 declaration under Section 564(b)(1) of the Act, 21 U.S.C. section 360bbb-3(b)(1), unless the authorization is terminated or revoked sooner. Performed at [facility], [place] [address], [place], [place] [address]   CBC with Differential     Status: Abnormal   Collection Time: 01/09/2019  6:43 AM  Result Value Ref Range   WBC 26.9 (H) 4.0 - 10.5 K/uL   RBC 3.78 (L) 4.22 - 5.81 MIL/uL   Hemoglobin 9.7 (L) 13.0 - 17.0 g/dL   HCT 28.8 (L) 39.0 - 52.0 %   MCV 76.2 (L) 80.0 - 100.0 fL   MCH 25.7 (L) 26.0 - 34.0 pg   MCHC 33.7 30.0 - 36.0 g/dL   RDW 17.9 (H) 11.5 - 15.5 %   Platelets 75 (L) 150 - 400 K/uL    Comment: REPEATED TO VERIFY PLATELET COUNT CONFIRMED BY SMEAR SPECIMEN CHECKED FOR CLOTS Immature Platelet Fraction may be clinically indicated, consider ordering this additional test [id number]    nRBC 0.0 0.0 - 0.2 %   Neutrophils Relative % 86 %    Neutro Abs 23.0 (H) 1.7 - 7.7 K/uL   Lymphocytes Relative 4 %   Lymphs Abs 1.2 0.7 - 4.0 K/uL   Monocytes Relative 8 %   Monocytes Absolute 2.2 (H) 0.1 - 1.0 K/uL   Eosinophils Relative 0 %   Eosinophils Absolute 0.0 0.0 - 0.5 K/uL   Basophils Relative 0 %   Basophils Absolute 0.0 0.0 - 0.1 K/uL   WBC Morphology VACUOLATED NEUTROPHILS    Immature Granulocytes 2 %   Abs Immature Granulocytes 0.43 (H) 0.00 - 0.07 K/uL   Polychromasia PRESENT    Target Cells PRESENT     Comment: Performed at [facility], [place] [address] [place], [place] [address]  Comprehensive metabolic panel     Status: Abnormal   Collection Time: 01/01/2019  6:43 AM  Result Value Ref Range  Sodium 122 (L) 135 - 145 mmol/L   Potassium 4.5 3.5 - 5.1 mmol/L   Chloride 90 (L) 98 - 111 mmol/L   CO2 22 22 - 32 mmol/L   Glucose, Bld 287 (H) 70 - 99 mg/dL   BUN 24 (H) 6 - 20 mg/dL   Creatinine, Ser 1.24 0.61 - 1.24 mg/dL   Calcium 8.0 (L) 8.9 - 10.3 mg/dL   Total Protein 7.1 6.5 - 8.1 g/dL   Albumin 2.1 (L) 3.5 - 5.0 g/dL   AST 27 15 - 41 U/L   ALT 29 0 - 44 U/L   Alkaline Phosphatase 53 38 - 126 U/L   Total Bilirubin 9.5 (H) 0.3 - 1.2 mg/dL   GFR calc non Af Amer >60 >60 mL/min   GFR calc Af Amer >60 >60 mL/min   Anion gap 10 5 - 15    Comment: Performed at [facility], [place] [address], [place], [place] [address]  Lipase, blood     Status: None   Collection Time: 12/28/2018  6:43 AM  Result Value Ref Range   Lipase 38 11 - 51 U/L    Comment: Performed at [facility], [place] [address] [place], [place] [address]  Protime-INR     Status: Abnormal   Collection Time: 12/26/2018  6:43 AM  Result Value Ref Range   Prothrombin Time 22.3 (H) 11.4 - 15.2 seconds   INR 2.0 (H) 0.8 - 1.2    Comment: (NOTE) INR goal varies based on device and disease states. Performed at [facility], [place] [address], [place], [place] [address]   Troponin I -  ONCE - STAT     Status: None   Collection Time: 01/18/2019  6:44 AM  Result Value Ref Range   Troponin I <0.03 <0.03 ng/mL    Comment: Performed at [facility], [place] [address], [place], [place] [address]  Lactic acid, plasma     Status: Abnormal   Collection Time: 01/14/2019  6:44 AM  Result Value Ref Range   Lactic Acid, Venous 6.4 (HH) 0.5 - 1.9 mmol/L    Comment: CRITICAL RESULT CALLED TO, READ BACK BY AND VERIFIED WITH: K.ZULETA NR AT 0721 ON 12/30/2018 BY S.VANHOORNE Performed at [facility], [place] [address], [place], [place] [address]   Lactic acid, plasma     Status: Abnormal   Collection Time: 12/24/2018 10:03 AM  Result Value Ref Range   Lactic Acid, Venous 5.1 (HH) 0.5 - 1.9 mmol/L    Comment: CRITICAL RESULT CALLED TO, READ BACK BY AND VERIFIED WITH: S.WEST RN AT 1049 ON 01/08/2019 BY S.VANHOORNE Performed at [facility], [place] [address], [place], [place] [address]    Dg Chest 2 View  Result Date: 01/01/2019 CLINICAL DATA:  Shortness of breath EXAM: CHEST - 2 VIEW COMPARISON:  12/13/2018 FINDINGS: Low volume chest with streaky opacity at the bases. Normal heart size when accounting for lung volumes. No Kerley lines, effusion, or pneumothorax. IMPRESSION: Low volume chest with atelectasis. Electronically Signed   By: Monte Fantasia M.D.   On: 01/16/2019 06:22   Ct Abdomen Pelvis W Contrast  Result Date: 12/30/2018 CLINICAL DATA:  Abdominal pain EXAM: CT ABDOMEN AND PELVIS WITH CONTRAST TECHNIQUE: Multidetector CT imaging of the abdomen and pelvis was performed using the standard protocol following bolus administration of intravenous contrast. CONTRAST:  149mL OMNIPAQUE IOHEXOL 300 MG/ML  SOLN COMPARISON:  12/13/2018 FINDINGS: Lower chest: Trace bilateral effusions.  Heart is normal size. Hepatobiliary: Cirrhosis with nodular shrunken liver. Gallbladder is distended. No visible stones. No biliary ductal dilatation. Pancreas: No focal  abnormality or ductal dilatation. Spleen: Splenomegaly with a craniocaudal length 18 cm. No focal abnormality. Adrenals/Urinary Tract: No adrenal abnormality. No focal renal abnormality. No stones or hydronephrosis. Urinary bladder is unremarkable. Stomach/Bowel: Stomach, large and small bowel grossly unremarkable. Vascular/Lymphatic: Aortic atherosclerosis. No enlarged abdominal or pelvic lymph nodes. Reproductive: No focal abnormality. Other: Large volume ascites in the abdomen and pelvis. This is increased since prior study. Musculoskeletal: No acute bony abnormality. IMPRESSION: Cirrhosis with associated splenomegaly and enlarging large abdominal and pelvic ascites. Trace bilateral pleural effusions. Aortic atherosclerosis. Electronically Signed   By: Rolm Baptise M.D.   On: 12/26/2018 09:47    Pending Labs Unresulted Labs (From admission, onward)    Start     Ordered   01/01/2019 1055  C difficile quick scan w PCR reflex  (C Difficile quick screen w PCR reflex panel)  Once, for 24 hours,   R     01/13/2019 1054   01/11/2019 0732  Culture, blood (routine x 2)  BLOOD CULTURE X 2,   STAT     01/01/2019 0731   Signed and Held  Urine culture  Once,   R     Signed and Held   Signed and Held  Basic metabolic panel  Tomorrow morning,   R     Signed and Held   Signed and Held  CBC  Tomorrow morning,   R     Signed and Held   Signed and Held  Basic metabolic panel  ONCE - STAT,   R     Signed and Held          Vitals/Pain Today's Vitals   01/03/2019 0900 12/30/2018 0937 01/18/2019 1000 12/24/2018 1115  BP: 106/77  112/78 117/79  Pulse: 93  97 (!) 101  Resp: 18  15 18   Temp:      TempSrc:      SpO2: 98%  96% 97%  PainSc:  7       Isolation Precautions Enteric precautions (UV disinfection)  Medications Medications  sodium chloride (PF) 0.9 % injection (has no administration in time range)  lidocaine (XYLOCAINE) 1 % (with pres) injection (has no administration in time range)  lactated ringers  bolus 1,000 mL (has no administration in time range)  morphine 4 MG/ML injection 4 mg (4 mg Intravenous Given 12/24/2018 0648)  ondansetron (ZOFRAN) injection 4 mg (4 mg Intravenous Given 12/21/2018 0647)  cefTRIAXone (ROCEPHIN) 1 g in sodium chloride 0.9 % 100 mL IVPB (0 g Intravenous Stopped 01/19/2019 0835)  lactated ringers bolus 1,000 mL (0 mLs Intravenous Stopped 01/13/2019 0937)  morphine 4 MG/ML injection 4 mg (4 mg Intravenous Given 01/19/2019 0831)  iohexol (OMNIPAQUE) 300 MG/ML solution 100 mL (100 mLs Intravenous Contrast Given 01/13/2019 0914)    Mobility walks

## 2018-12-25 NOTE — ED Notes (Signed)
Alvino Chapel, MD aware of lactic acid.

## 2018-12-25 NOTE — ED Notes (Signed)
Called to give report RN passing medications, will return call to receive report

## 2018-12-25 NOTE — ED Notes (Signed)
Patient taken for paracentesis

## 2018-12-25 NOTE — ED Notes (Signed)
EDP at bedside  

## 2018-12-25 NOTE — Consult Note (Addendum)
Referring Provider: [facility]           Primary Care Physician:  Michael Roche, MD Primary Gastroenterologist:  Michael Mango, MD           Reason for Consultation:   Autoimmune hepatitis  / abdominal pain  ASSESSMENT /  PLAN:    ADDENDUM: He has SBP.  1. Will stop beta blocker 2. Will stop diuretics 3. Albumin IV 1.5 grams / kg today and then 1 gram / kg day #3      ( Sunday) 4. Await fluid culture. In the interim, I spoke with Hospitalist to see if Cefotaxime available. If not then will continue Rocephin which was initiated at higher dose 5. Will need repeat diagnostic tap at some point to ensure resolution of SBP. 6. Not clear if he has been taking Prednisone. I think he was supposed to be taking 30 mg daily. In setting of infection will reduce dose to 10 mg daily ( to avoid adrenal insuff).      83. 58 yo non-English speaking [name] with HBV / AIH / cirrhosis complicated by portal HTN with ascites and esophageal varices. AIH treated with prednisone  (we have been hesitant to commit to other immunomodulator / immunosuppressants out of concern for his ability to comply). On entecavir for HBV. See #2  2. Decompensated cirrhosis with elevated bilirubin, progressive ascites (but recently off diuretics). He has abdominal pain, low grade temp.  Rule out SBP. Of note, the abdominal pain is similar to when seen in ED on 5/24 and found to have colitis. Repeat CT scan today negative for residual bowel wall thickening. Perhaps colitis at that time was an incidental finding and not the cause of abdominal pain.  -INR stable around 2. Tbili is elevated at 9.5, up to 2.7.  -Agree with diagnostic paracentesis. Just returned and had 2.7 liters removed. Fluid studies pending -Agree with empiric antibiotics -Blood cultures pending -Continue beta blocker for now.  -Lasix on hold ( low Na+), Continuing aldactone at 50 mg / day   2  AKI, actually improving over last several  days.  3. Hyponatremia, Na+ 122. Hyponatremic since late April, prior to that his baseline was ~ 130.  -Lasix on hold -For repeat Na+ level after IVF.   4. Recent "colitis" on contrast CTAP, interesting in absence of diarrhea but pain resolved with antibiotics. No residual bowel wall thickening on repeat CTscan today  BRIEF HISTORY:    58 yo non-English speaking [name] well known to [place] for hx of HBV / AIH / cirrhosis. He also has a hx of H.pylori, DM and HTN. He presented to ED today with complaints of abdominal pain / worsening abdominal distention and constipation.   ED EVALUATION:   WBC 26.9. He is slightly tachycardic, temp 99.4.  COVID 19 negative.  CTAP w/ contrast shows only cirrhosis, increasing amount of ascites. No evidence of remaining colitis.  He has AKI but Cr improved over what is was in ED on 12/13/18   HPI:                                            Michael Hamilton is a 58 y.o. 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 We have treated him with prednisone, entecavir and  diuretics. He is scheduled for a Hepatology consult on 6/15.   Patient was seen in ED on 5/24 with complaints of LLQ pain, N/V and constipation. CTAP w/ contrast suggested colitis involving  the sigmoid colon, splenic flexure and proximal descending colon which was interesting in the absence of diarrhea. WBC was elevated at 21K but had nearly normalized on recheck 12/21/18.  He was prescribed Cipro / Flagyl by the ED which our office was confused about because patient should have already been taking flagyl as part of H.pylori regimen.   Patient presented to ED again today with complaints of abdominal pain. His WBC is elevated, low grade temp, new elevation in total bilirubin. Daughter speaks [place] and serves as Optometrist. The abdominal pain is generalized and feels like the pain for which he was seen in ED on  5/24. The pain got better after antibiotics but recurred 6 days ago. Pain has gotten worse as abdomen has become more distended. We put diuretics on hold several days ago but PCP restarted itwo days ago.  It sounds like he restarted the Aldactone but may not of had any of the lasix left at home. He had some N/V several days ago, none since. He has been on Miralax twice daily for several days but not having regular BMs.     SIGNIFICANT HOSPITAL EVENTS / PROCEDURES:    12/22/2018 LVP - 2.5 liters. Fluid studies pending.    PREVIOUS GI STUDIES:    12/24/18  Stool H.pylori (post tx) negative.    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 - CIRRHOSIS (STAGE 4 OF 4). - SEE NOTE  11/09/18 EGD for varices screening Esophagogastric landmarks identified. - Grade II esophageal varices. - Normal esophagus otherwise. 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 Biopsied. - Normal stomach otherwise, no gastric varices - Normal duodenal bulb and second portion of the duodenum  11/09/18 colonoscopy for anemia Angiodysplastic lesions 4 small polyps, 2 were adenomas  Past Medical History:  Diagnosis Date  . Anemia   . Cirrhosis ([place])    with ascites  . Diabetes mellitus ([place])   . Essential hypertension   . Frequent headaches   . Rotator cuff arthropathy of right shoulder     Past Surgical History:  Procedure Laterality Date  . IR TRANSCATHETER BX  11/16/2018  . IR US GUIDE VASC ACCESS RIGHT  11/16/2018  . IR VENOGRAM HEPATIC WO HEMODYNAMIC EVALUATION  11/16/2018  . SHOULDER ARTHROSCOPY W/ ROTATOR CUFF REPAIR Right     Prior to Admission medications   Medication Sig Start Date End Date Taking? Authorizing Provider  entecavir (BARACLUDE) 0.5 MG tablet Take 1 tablet (0.5 mg total) by mouth daily. 11/18/18  Yes Armbruster, Carlota Raspberry,  MD  furosemide (LASIX) 20 MG tablet Take 1 tablet (20 mg total) by mouth daily. 10/06/18  Yes Armbruster, Carlota Raspberry, MD  Insulin Glargine (LANTUS SOLOSTAR) 100 UNIT/ML Solostar Pen Inject 15 Units into the skin daily at 10 pm. 12/23/18  Yes Isabelle Course, MD  METFORMIN HCL PO Take 1 tablet by mouth 2 (two) times a day.   Yes [provider]  morphine (MSIR) 15 MG tablet Take 1 tablet (15 mg total) by mouth every 4 (four) hours as needed for severe pain. 12/13/18  Yes Deno Etienne, DO  omeprazole (PRILOSEC) 40 MG capsule Take 1 capsule (40 mg total) by mouth 2 (  two) times daily. 11/10/18  Yes Armbruster, Carlota Raspberry, MD  ondansetron (ZOFRAN ODT) 4 MG disintegrating tablet Take 1 tablet (4 mg total) by mouth every 8 (eight) hours as needed for nausea or vomiting. 12/13/18  Yes Deno Etienne, DO  polyethylene glycol [facility]) packet Take 17 g by mouth 2 (two) times daily. 08/21/18  Yes Santos-Sanchez, Merlene Morse, MD  propranolol (INDERAL) 20 MG tablet Take 1 tablet (20 mg total) by mouth 2 (two) times daily. 11/05/18  Yes Armbruster, Carlota Raspberry, MD  spironolactone (ALDACTONE) 50 MG tablet Take 1 tablet (50 mg total) by mouth daily. 10/06/18  Yes Armbruster, Carlota Raspberry, MD  glucose blood (ONETOUCH VERIO) test strip Test blood sugars as directed. Dx: E11.9 09/11/16   Dorothyann Peng, NP  Insulin Pen Needle 31G X 5 MM MISC Use 1 time daily with injections 07/31/18   Michael Roche, MD  ONE TOUCH LANCETS MISC Test blood sugars as directed. Dx: E11.9 09/11/16   Dorothyann Peng, NP  propranolol (INDERAL) 20 MG/5ML solution Take 5 mLs (20 mg total) by mouth 2 (two) times a day. Patient not taking: Reported on 12/27/2018 11/06/18   Armbruster, Carlota Raspberry, MD  senna-docusate (SENOKOT-S) 8.6-50 MG tablet Take 2 tablets by mouth 2 (two) times daily. Patient not taking: Reported on 01/05/2019 08/21/18   Michael Roche, MD    Current Facility-Administered Medications  Medication Dose Route Frequency Provider Last Rate Last  Dose  . acetaminophen (TYLENOL) tablet 650 mg  650 mg Oral Q6H PRN Rai, Ripudeep K, MD       Or  . acetaminophen (TYLENOL) suppository 650 mg  650 mg Rectal Q6H PRN Rai, Ripudeep K, MD      . cefTRIAXone (ROCEPHIN) 1 g in sodium chloride 0.9 % 100 mL IVPB  1 g Intravenous STAT Rai, Ripudeep K, MD      . Derrill Memo ON 12/26/2018] cefTRIAXone (ROCEPHIN) 2 g in sodium chloride 0.9 % 100 mL IVPB  2 g Intravenous Q24H Rai, Ripudeep K, MD      . entecavir (BARACLUDE) tablet 0.5 mg  0.5 mg Oral Daily Rai, Ripudeep K, MD      . HYDROmorphone (DILAUDID) injection 1 mg  1 mg Intravenous Q4H PRN Rai, Ripudeep K, MD      . insulin aspart (novoLOG) injection 0-5 Units  0-5 Units Subcutaneous QHS Rai, Ripudeep K, MD      . insulin aspart (novoLOG) injection 0-9 Units  0-9 Units Subcutaneous TID WC Rai, Ripudeep K, MD      . insulin glargine (LANTUS) injection 15 Units  15 Units Subcutaneous Q2200 Rai, Ripudeep K, MD      . lidocaine (XYLOCAINE) 1 % (with pres) injection           . morphine (MSIR) tablet 15 mg  15 mg Oral Q4H PRN Rai, Ripudeep K, MD      . ondansetron (ZOFRAN) tablet 4 mg  4 mg Oral Q6H PRN Rai, Ripudeep K, MD       Or  . ondansetron (ZOFRAN) injection 4 mg  4 mg Intravenous Q6H PRN Rai, Ripudeep K, MD      . pantoprazole (PROTONIX) EC tablet 40 mg  40 mg Oral Daily Rai, Ripudeep K, MD      . polyethylene glycol (MIRALAX / GLYCOLAX) packet 17 g  17 g Oral BID Rai, Ripudeep K, MD      . propranolol (INDERAL) tablet 20 mg  20 mg Oral BID Rai, Vernelle Emerald, MD      .  sodium chloride (PF) 0.9 % injection           . spironolactone (ALDACTONE) tablet 50 mg  50 mg Oral Daily Rai, Ripudeep K, MD        Allergies as of 01/15/2019  . (No Known Allergies)    Family History  Problem Relation Age of Onset  . Diabetes Neg Hx   . Colon cancer Neg Hx   . Esophageal cancer Neg Hx     Social History   Socioeconomic History  . Marital status: Married    Spouse name: Not on file  . Number of children:  Not on file  . Years of education: Not on file  . Highest education level: Not on file  Occupational History  . Not on file  Social Needs  . Financial resource strain: Not on file  . Food insecurity:    Worry: Not on file    Inability: Not on file  . Transportation needs:    Medical: Not on file    Non-medical: Not on file  Tobacco Use  . Smoking status: Former Smoker    Years: 2.00  . Smokeless tobacco: Never Used  . Tobacco comment: quit when he was 21  Substance and Sexual Activity  . Alcohol use: No  . Drug use: No  . Sexual activity: Yes    Birth control/protection: None  Lifestyle  . Physical activity:    Days per week: Not on file    Minutes per session: Not on file  . Stress: Not on file  Relationships  . Social connections:    Talks on phone: Not on file    Gets together: Not on file    Attends religious service: Not on file    Active member of club or organization: Not on file    Attends meetings of clubs or organizations: Not on file    Relationship status: Not on file  . Intimate partner violence:    Fear of current or ex partner: Not on file    Emotionally abused: Not on file    Physically abused: Not on file    Forced sexual activity: Not on file  Other Topics Concern  . Not on file  Social History Narrative   Works for [employer]     Review of Systems: All systems reviewed and negative except where noted in HPI.  Physical Exam: Vital signs in last 24 hours: Temp:  [98.2 F (36.8 C)-99.4 F (37.4 C)] 98.2 F (36.8 C) (06/05 1243) Pulse Rate:  [92-103] 103 (06/05 1243) Resp:  [15-19] 19 (06/05 1243) BP: (96-125)/(62-79) 125/75 (06/05 1243) SpO2:  [95 %-99 %] 95 % (06/05 1243)   General:   Alert, Asian male eating lunch in NAD Psych:  Pleasant, cooperative. Normal mood and affect. Eyes:  Pupils equal, sclera clear Ears:  Normal auditory acuity. Nose:  No deformity, discharge,  or lesions. Neck:  Supple; no masses Lungs:  Clear  throughout to auscultation.   No wheezes, crackles, or rhonchi.  Heart:  Regular rate and rhythm;  Trace BLE edema Abdomen:  Soft, mild-mod distended ( just had 2 L removed), nontender, BS active, no palp mass    Rectal:  Deferred  Msk:  Symmetrical without gross deformities. . Neurologic:  Alert and  oriented x4;  grossly normal neurologically. Skin:  Intact without significant lesions or rashes.   Intake/Output from previous day: No intake/output data recorded. Intake/Output this shift: Total I/O In: 1105.3 [IV Piggyback:1105.3] Out: -  Lab Results: Recent Labs    01/04/2019 0643  WBC 26.9*  HGB 9.7*  HCT 28.8*  PLT 75*   BMET Recent Labs    01/03/2019 0643  NA 122*  K 4.5  CL 90*  CO2 22  GLUCOSE 287*  BUN 24*  CREATININE 1.24  CALCIUM 8.0*   LFT Recent Labs    12/30/2018 0643  PROT 7.1  ALBUMIN 2.1*  AST 27  ALT 29  ALKPHOS 53  BILITOT 9.5*   PT/INR Recent Labs    12/29/2018 0643  LABPROT 22.3*  INR 2.0*   Hepatitis Panel No results for input(s): HEPBSAG, HCVAB, HEPAIGM, HEPBIGM in the last 72 hours.    Studies/Results: Dg Chest 2 View  Result Date: 12/30/2018 CLINICAL DATA:  Shortness of breath EXAM: CHEST - 2 VIEW COMPARISON:  12/13/2018 FINDINGS: Low volume chest with streaky opacity at the bases. Normal heart size when accounting for lung volumes. No Kerley lines, effusion, or pneumothorax. IMPRESSION: Low volume chest with atelectasis. Electronically Signed   By: Monte Fantasia M.D.   On: 12/24/2018 06:22   Ct Abdomen Pelvis W Contrast  Result Date: 12/24/2018 CLINICAL DATA:  Abdominal pain EXAM: CT ABDOMEN AND PELVIS WITH CONTRAST TECHNIQUE: Multidetector CT imaging of the abdomen and pelvis was performed using the standard protocol following bolus administration of intravenous contrast. CONTRAST:  178mL OMNIPAQUE IOHEXOL 300 MG/ML  SOLN COMPARISON:  12/13/2018 FINDINGS: Lower chest: Trace bilateral effusions.  Heart is normal size. Hepatobiliary:  Cirrhosis with nodular shrunken liver. Gallbladder is distended. No visible stones. No biliary ductal dilatation. Pancreas: No focal abnormality or ductal dilatation. Spleen: Splenomegaly with a craniocaudal length 18 cm. No focal abnormality. Adrenals/Urinary Tract: No adrenal abnormality. No focal renal abnormality. No stones or hydronephrosis. Urinary bladder is unremarkable. Stomach/Bowel: Stomach, large and small bowel grossly unremarkable. Vascular/Lymphatic: Aortic atherosclerosis. No enlarged abdominal or pelvic lymph nodes. Reproductive: No focal abnormality. Other: Large volume ascites in the abdomen and pelvis. This is increased since prior study. Musculoskeletal: No acute bony abnormality. IMPRESSION: Cirrhosis with associated splenomegaly and enlarging large abdominal and pelvic ascites. Trace bilateral pleural effusions. Aortic atherosclerosis. Electronically Signed   By: Rolm Baptise M.D.   On: 01/18/2019 09:47   US Paracentesis  Result Date: 01/18/2019 INDICATION: Patient with history of autoimmune hepatitis, cirrhosis, ascites. Request made for diagnostic and therapeutic paracentesis. EXAM: ULTRASOUND GUIDED DIAGNOSTIC AND THERAPEUTIC PARACENTESIS MEDICATIONS: None COMPLICATIONS: None immediate. PROCEDURE: Informed written consent was obtained from the patient/daughter after a discussion of the risks, benefits and alternatives to treatment. A timeout was performed prior to the initiation of the procedure. Initial ultrasound scanning demonstrates a moderate amount of ascites within the right upper to mid abdominal quadrant. The right upper to mid abdomen was prepped and draped in the usual sterile fashion. 1% lidocaine was used for local anesthesia. Following this, a 19 gauge, 7-cm, Yueh catheter was introduced. An ultrasound image was saved for documentation purposes. The paracentesis was performed. The catheter was removed and a dressing was applied. The patient tolerated the procedure well  without immediate post procedural complication. FINDINGS: A total of approximately 2.7 liters of turbid, yellow fluid was removed. Samples were sent to the laboratory as requested by the clinical team. IMPRESSION: Successful ultrasound-guided diagnostic and therapeutic paracentesis yielding 2.7 liters of peritoneal fluid. Read by: Rowe Robert, PA-C Electronically Signed   By: Sandi Mariscal M.D.   On: 01/06/2019 12:41     Tye Savoy, NP-C @  01/16/2019, 1:42 PM

## 2018-12-25 NOTE — ED Provider Notes (Signed)
TIME SEEN: 5:34 AM  CHIEF COMPLAINT: Abdominal pain  HPI: Patient is a 58 year old male with history of autoimmune hepatitis recently confirmed on liver biopsy, cirrhosis, hypertension, diabetes who presents to the emergency department with 6 days of worsening abdominal pain.  Patient denies any fever but has low-grade temperature here of 99 and feels warm to touch.  Has had nausea but no vomiting.  Has had some diarrhea.  Was seen in the emergency department on May 24 and diagnosed with colitis.  Finished his prescriptions for Cipro and Flagyl.  Patient seen in internal medicine clinic on June 3 for abdominal distention and shortness of breath.  Instructed at that time to resume spironolactone and furosemide.  Patient has never had a paracentesis.  States he is short of breath mostly with lying flat.  No chest pain.  No cough.  No exposures to sick contacts or anyone with coronavirus.  Patient's daughter acts as Geologist, engineering.  ROS: See HPI Constitutional: no fever  Eyes: no drainage  ENT: no runny nose   Cardiovascular:  no chest pain  Resp:  SOB  GI: no vomiting GU: no dysuria Integumentary: no rash  Allergy: no hives  Musculoskeletal: no leg swelling  Neurological: no slurred speech ROS otherwise negative  PAST MEDICAL HISTORY/PAST SURGICAL HISTORY:  Past Medical History:  Diagnosis Date  . Anemia   . Cirrhosis ([place])    with ascites  . Diabetes mellitus ([place])   . Essential hypertension   . Frequent headaches   . Rotator cuff arthropathy of right shoulder     MEDICATIONS:  Prior to Admission medications   Medication Sig Start Date End Date Taking? Authorizing Provider  entecavir (BARACLUDE) 0.5 MG tablet Take 1 tablet (0.5 mg total) by mouth daily. 11/18/18   Armbruster, Carlota Raspberry, MD  furosemide (LASIX) 20 MG tablet Take 1 tablet (20 mg total) by mouth daily. 10/06/18   Armbruster, Carlota Raspberry, MD  glucose blood (ONETOUCH VERIO) test strip Test blood sugars as directed.  Dx: E11.9 09/11/16   Dorothyann Peng, NP  Insulin Glargine (LANTUS SOLOSTAR) 100 UNIT/ML Solostar Pen Inject 15 Units into the skin daily at 10 pm. 12/23/18   Isabelle Course, MD  Insulin Pen Needle 31G X 5 MM MISC Use 1 time daily with injections 07/31/18   Santos-Sanchez, Merlene Morse, MD  morphine (MSIR) 15 MG tablet Take 1 tablet (15 mg total) by mouth every 4 (four) hours as needed for severe pain. 12/13/18   Deno Etienne, DO  omeprazole (PRILOSEC) 40 MG capsule Take 1 capsule (40 mg total) by mouth 2 (two) times daily. 11/10/18   Armbruster, Carlota Raspberry, MD  ondansetron (ZOFRAN ODT) 4 MG disintegrating tablet Take 1 tablet (4 mg total) by mouth every 8 (eight) hours as needed for nausea or vomiting. 12/13/18   Deno Etienne, DO  ONE TOUCH LANCETS MISC Test blood sugars as directed. Dx: E11.9 09/11/16   Nafziger, Tommi Rumps, NP  polyethylene glycol [facility]) packet Take 17 g by mouth 2 (two) times daily. 08/21/18   Santos-Sanchez, Merlene Morse, MD  propranolol (INDERAL) 20 MG tablet Take 1 tablet (20 mg total) by mouth 2 (two) times daily. 11/05/18   Armbruster, Carlota Raspberry, MD  propranolol (INDERAL) 20 MG/5ML solution Take 5 mLs (20 mg total) by mouth 2 (two) times a day. 11/06/18   Armbruster, Carlota Raspberry, MD  senna-docusate (SENOKOT-S) 8.6-50 MG tablet Take 2 tablets by mouth 2 (two) times daily. 08/21/18   [name], MD  spironolactone (ALDACTONE) 50 MG  tablet Take 1 tablet (50 mg total) by mouth daily. 10/06/18   Armbruster, Carlota Raspberry, MD    ALLERGIES:  No Known Allergies  SOCIAL HISTORY:  Social History   Tobacco Use  . Smoking status: Former Smoker    Years: 2.00  . Smokeless tobacco: Never Used  . Tobacco comment: quit when he was 21  Substance Use Topics  . Alcohol use: No    FAMILY HISTORY: Family History  Problem Relation Age of Onset  . Diabetes Neg Hx   . Colon cancer Neg Hx   . Esophageal cancer Neg Hx     EXAM: BP 117/79 (BP Location: Right Arm)   Pulse (!) 102   Temp 99 F (37.2 C) (Oral)    Resp 18   SpO2 99%  CONSTITUTIONAL: Alert and oriented and responds appropriately to questions.  Appears uncomfortable HEAD: Normocephalic EYES: Conjunctivae clear, pupils appear equal, EOMI ENT: normal nose; moist mucous membranes NECK: Supple, no meningismus, no nuchal rigidity, no LAD  CARD: Regular and tachycardic; S1 and S2 appreciated; no murmurs, no clicks, no rubs, no gallops RESP: Normal chest excursion without splinting or tachypnea; breath sounds clear and equal bilaterally; no wheezes, no rhonchi, no rales, no hypoxia or respiratory distress, speaking full sentences ABD/GI: Abdomen is distended with fluid wave, no tympany, diffusely tender to palpation some intermittent voluntary guarding but no rebound, no redness or warmth, no rash or other lesions noted BACK:  The back appears normal and is non-tender to palpation, there is no CVA tenderness EXT: Normal ROM in all joints; non-tender to palpation; no edema; normal capillary refill; no cyanosis, no calf tenderness or swelling    SKIN: Normal color for age and race; warm; no rash NEURO: Moves all extremities equally PSYCH: The patient's mood and manner are appropriate. Grooming and personal hygiene are appropriate.  MEDICAL DECISION MAKING: Patient here with complaints of increasing shortness of breath with lying flat, increasing abdominal pain and distention.  It appears patient stopped taking his furosemide and Spironolactone and was instructed to restart these medications by his primary care doctor 2 days ago.  He has never had a paracentesis before.  No previous history of SBP per patient's records.  Recently diagnosed with autoimmune hepatitis.  Patient also recently in the ED with colitis.  He is diffusely tender to palpation today but no peritoneal signs.  Will repeat CT imaging given he has some guarding on exam and states pain is worsening.  Will obtain labs, urine.  Also complains of shortness of breath.  Will obtain chest  x-ray, EKG and troponin.  Shortness of breath likely secondary to his abdominal distention.  SBP is on the differential.  Patient will likely need diagnostic paracentesis.  Differential also includes colitis, perforation, diverticulitis, appendicitis.  ED PROGRESS: I have performed a bedside ultrasound which does show small volume ascites.  There is no pocket that I can get to successfully the does not have bowel right next to the skin.  I am unable to perform diagnostic paracentesis here in the ED.   Chest x-ray clear.  Labs, urine, CT of the abdomen pelvis pending.  Signed out to oncoming ED physician.     Date: 01/07/2019 5:47 AM  Rate: 97  Rhythm: normal sinus rhythm  QRS Axis: normal  Intervals: normal  ST/T Wave abnormalities: T wave inversions in lead III, no old for comparison  Conduction Disutrbances: none  Narrative Interpretation: Isolated T wave inversions in lead III, no old for comparison  Nikie Cid, Delice Bison, DO 12/22/2018 [phone]

## 2018-12-25 NOTE — Procedures (Signed)
Ultrasound-guided diagnostic and therapeutic paracentesis performed yielding 2.7 liters of turbid, yellow fluid. No immediate complications.  A portion of the fluid was submitted to the lab for preordered studies. EBL< 2cc.

## 2018-12-25 NOTE — H&P (Signed)
History and Physical        Hospital Admission Note Date: 01/07/2019  Patient name: Michael Hamilton Medical record number: [id number] Date of birth: 1961-01-05 Age: 58 y.o. Gender: male  PCP: [name], MD  Patient follows IM residency clinic, currently no bed available at [facility]  Patient coming from: Home  I have reviewed all records in the [facility].    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  History was obtained with the assistance of patient's daughter at the bedside who translated in [place].  Patient describes the pain as diffuse, intermittent episodes, sharp and stabbing, 10/10 in the last 6 days.  He also noticed abdominal bloating, nausea, low-grade fever.  Patient reports he is feeling short of breath on lying, no chest pain, no cough.  Per daughter, one episode of diarrhea last night. Patient was seen in ED on 5/24 and was diagnosed with colitis, placed on ciprofloxacin and Flagyl which he has completed. Patient was also seen in internal medicine clinic on 6/3 and was instructed to continue spironolactone and furosemide, belly was soft and did not need paracentesis at that time.  COVID-19 negative  ED work-up/course:  Temp 99.4, respirate 18, pulse 102, BP 117/79, O2 sats 99% on room air  Sodium 122, potassium 4.5, chloride 90, bicarb 22, BUN 24, creatinine 1.2 WBCs 26.9, hemoglobin 9.7, platelets 75K  lactic acid 6.4, second lactic acid repeated 5.1  Review of Systems: Positives marked in 'bold' Constitutional: Denies fever, chills, diaphoresis,  poor appetite and fatigue.  HEENT: Denies photophobia, eye pain, redness, hearing loss, ear pain, congestion, sore throat, rhinorrhea, sneezing, mouth sores, trouble swallowing, neck pain, neck stiffness and tinnitus.   Respiratory: Denies cough, chest tightness,  and wheezing.   Cardiovascular: Denies chest pain, palpitations and leg swelling.  Orthopnea+ with worsening ascites Gastrointestinal: Please see HPI Genitourinary: Denies dysuria, urgency, frequency, hematuria, flank pain and difficulty urinating.  Musculoskeletal: Denies myalgias, back pain, joint swelling, arthralgias and gait problem.  Skin: Denies pallor, rash and wound.  Neurological: Denies dizziness, seizures, syncope, weakness, light-headedness, numbness and headaches.  Hematological: Denies adenopathy. Easy bruising, personal or family bleeding history  Psychiatric/Behavioral: Denies suicidal ideation, mood changes, confusion, nervousness, sleep disturbance and agitation  Past Medical History: Past Medical History:  Diagnosis Date  . Anemia   . Cirrhosis ([place])    with ascites  . Diabetes mellitus ([place])   . Essential hypertension   . Frequent headaches   . Rotator cuff arthropathy of right shoulder     Past Surgical History:  Procedure Laterality Date  . IR TRANSCATHETER BX  11/16/2018  . IR US GUIDE VASC ACCESS RIGHT  11/16/2018  . IR VENOGRAM HEPATIC WO HEMODYNAMIC EVALUATION  11/16/2018  . SHOULDER ARTHROSCOPY W/ ROTATOR CUFF REPAIR Right     Medications: Prior to Admission medications   Medication Sig Start Date End Date Taking? Authorizing Provider  entecavir (BARACLUDE) 0.5 MG tablet  Take 1 tablet (0.5 mg total) by mouth daily. 11/18/18  Yes Armbruster, Carlota Raspberry, MD  furosemide (LASIX) 20 MG tablet Take 1 tablet (20 mg total) by mouth daily. 10/06/18  Yes Armbruster, Carlota Raspberry, MD  Insulin Glargine (LANTUS SOLOSTAR) 100 UNIT/ML Solostar Pen Inject 15 Units into the skin daily at 10 pm. 12/23/18  Yes Isabelle Course, MD  METFORMIN HCL PO Take 1 tablet by mouth 2 (two) times a day.   Yes [provider]  morphine (MSIR) 15 MG tablet Take 1 tablet (15 mg total) by mouth every 4 (four) hours as needed for severe pain. 12/13/18  Yes Deno Etienne, DO  omeprazole (PRILOSEC) 40 MG capsule Take 1 capsule (40 mg total) by mouth 2 (two) times daily. 11/10/18  Yes Armbruster, Carlota Raspberry, MD  ondansetron (ZOFRAN ODT) 4 MG disintegrating tablet Take 1 tablet (4 mg total) by mouth every 8 (eight) hours as needed for nausea or vomiting. 12/13/18  Yes Deno Etienne, DO  polyethylene glycol [facility]) packet Take 17 g by mouth 2 (two) times daily. 08/21/18  Yes Santos-Sanchez, Merlene Morse, MD  propranolol (INDERAL) 20 MG tablet Take 1 tablet (20 mg total) by mouth 2 (two) times daily. 11/05/18  Yes Armbruster, Carlota Raspberry, MD  spironolactone (ALDACTONE) 50 MG tablet Take 1 tablet (50 mg total) by mouth daily. 10/06/18  Yes Armbruster, Carlota Raspberry, MD  glucose blood (ONETOUCH VERIO) test strip Test blood sugars as directed. Dx: E11.9 09/11/16   Dorothyann Peng, NP  Insulin Pen Needle 31G X 5 MM MISC Use 1 time daily with injections 07/31/18   [name], MD  ONE TOUCH LANCETS MISC Test blood sugars as directed. Dx: E11.9 09/11/16   Dorothyann Peng, NP  propranolol (INDERAL) 20 MG/5ML solution Take 5 mLs (20 mg total) by mouth 2 (two) times a day. Patient not taking: Reported on 01/18/2019 11/06/18   Armbruster, Carlota Raspberry, MD  senna-docusate (SENOKOT-S) 8.6-50 MG tablet Take 2 tablets by mouth 2 (two) times daily. Patient not taking: Reported on 01/19/2019 08/21/18   [name], MD    Allergies:  No Known Allergies  Social History:  reports that he has quit smoking. He quit after 2.00 years of use. He has never used smokeless tobacco. He reports that he does not drink alcohol or use drugs.  Family History: Family History  Problem Relation Age of Onset  . Diabetes Neg Hx   . Colon cancer Neg Hx   . Esophageal cancer Neg  Hx     Physical Exam: Blood pressure 112/78, pulse 97, temperature 99.4 F (37.4 C), temperature source Rectal, resp. rate 15, SpO2 96 %. General: Alert, awake, oriented x3, in no acute distress. Eyes: pink conjunctiva,anicteric sclera, pupils equal and reactive to light and accomodation, HEENT: normocephalic, atraumatic, oropharynx clear Neck: supple, no masses or lymphadenopathy, no goiter, no bruits, no JVD CVS: Regular rate and rhythm, without murmurs, rubs or gallops. No lower extremity edema Resp : Clear to auscultation bilaterally, no wheezing, rales or rhonchi. GI : Soft, diffuse tenderness with abdominal distention, voluntary guarding. + NBS.  Musculoskeletal: No clubbing or cyanosis, positive pedal pulses. No contracture. ROM intact  Neuro: Grossly intact, no focal neurological deficits, strength 5/5 upper and lower extremities bilaterally Psych: alert and oriented x 3, normal mood and affect Skin: no rashes or lesions, warm and dry   LABS on Admission: I have personally reviewed all the labs and imagings below    Basic Metabolic Panel: Recent Labs  Lab  12/21/18 1042 12/30/2018 0643  NA 124* 122*  K 5.0 4.5  CL 92* 90*  CO2 22 22  GLUCOSE 344* 287*  BUN 22 24*  CREATININE 1.34 1.24  CALCIUM 9.4 8.0*   Liver Function Tests: Recent Labs  Lab 01/02/2019 0643  AST 27  ALT 29  ALKPHOS 53  BILITOT 9.5*  PROT 7.1  ALBUMIN 2.1*   Recent Labs  Lab 01/11/2019 0643  LIPASE 38   No results for input(s): AMMONIA in the last 168 hours. CBC: Recent Labs  Lab 12/21/18 1042 12/28/2018 0643  WBC 11.2* 26.9*  NEUTROABS 9.4* 23.0*  HGB 10.2* 9.7*  HCT 31.2* 28.8*  MCV 78.0 76.2*  PLT 133.0* 75*   Cardiac Enzymes: Recent Labs  Lab 01/16/2019 0644  TROPONINI <0.03   BNP: Invalid input(s): POCBNP CBG: Recent Labs  Lab 12/23/18 0857  GLUCAP 470*    Radiological Exams on Admission:  Dg Chest 2 View  Result Date: 01/15/2019 CLINICAL DATA:  Shortness of breath  EXAM: CHEST - 2 VIEW COMPARISON:  12/13/2018 FINDINGS: Low volume chest with streaky opacity at the bases. Normal heart size when accounting for lung volumes. No Kerley lines, effusion, or pneumothorax. IMPRESSION: Low volume chest with atelectasis. Electronically Signed   By: Monte Fantasia M.D.   On: 01/15/2019 06:22   Ct Abdomen Pelvis W Contrast  Result Date: 12/28/2018 CLINICAL DATA:  Abdominal pain EXAM: CT ABDOMEN AND PELVIS WITH CONTRAST TECHNIQUE: Multidetector CT imaging of the abdomen and pelvis was performed using the standard protocol following bolus administration of intravenous contrast. CONTRAST:  114mL OMNIPAQUE IOHEXOL 300 MG/ML  SOLN COMPARISON:  12/13/2018 FINDINGS: Lower chest: Trace bilateral effusions.  Heart is normal size. Hepatobiliary: Cirrhosis with nodular shrunken liver. Gallbladder is distended. No visible stones. No biliary ductal dilatation. Pancreas: No focal abnormality or ductal dilatation. Spleen: Splenomegaly with a craniocaudal length 18 cm. No focal abnormality. Adrenals/Urinary Tract: No adrenal abnormality. No focal renal abnormality. No stones or hydronephrosis. Urinary bladder is unremarkable. Stomach/Bowel: Stomach, large and small bowel grossly unremarkable. Vascular/Lymphatic: Aortic atherosclerosis. No enlarged abdominal or pelvic lymph nodes. Reproductive: No focal abnormality. Other: Large volume ascites in the abdomen and pelvis. This is increased since prior study. Musculoskeletal: No acute bony abnormality. IMPRESSION: Cirrhosis with associated splenomegaly and enlarging large abdominal and pelvic ascites. Trace bilateral pleural effusions. Aortic atherosclerosis. Electronically Signed   By: Rolm Baptise M.D.   On: 01/03/2019 09:47      EKG: Independently reviewed.  97, sinus rhythm   Assessment/Plan Principal Problem:   Abdominal pain in the setting of ascites, cirrhosis, recently diagnosed autoimmune hepatitis, confirmed on biopsy on 4/27,  leukocytosis -Presenting with abdominal pain, ascites, cirrhosis, recently diagnosed autoimmune hepatitis, unclear if patient has been started on prednisone -Given fevers and abdominal pain, ascites, IR consulted for diagnostic and therapeutic tap, rule out SBP -Obtain blood cultures, placed on IV Rocephin for now -Requested GI consult, patient follows Dr. Havery Moros -Given episode of diarrhea last night I have requested C. difficile.  Patient was on antibiotics for colitis which he has recently finished.  Active Problems: Hyponatremia in the setting of cirrhosis, lactic acidosis-mild acute on chronic -Baseline sodium around 124, patient is alert and oriented -Lactic acidosis could be from metformin although in the last few days patient has not been eating well -Patient has received 2 L bolus in the ED, will recheck sodium, lactic acid is improving -Hold Lasix for today    Diabetes mellitus without complication (HCC) -Continue Lantus, placed  on sliding scale insulin -Hemoglobin A1c 7.7    Essential hypertension -BP currently soft, hold Lasix     Autoimmune hepatitis ([place]) -GI consulted, will follow recommendations  Acute on chronic thrombocytopenia -Likely due to cirrhosis and newly diagnosed autoimmune hepatitis -Follow counts closely  Moderate protein calorie malnutrition with hypoalbuminemia -Albumin 2.1, dietitian consult    DVT prophylaxis: SCDs  CODE STATUS: Full code  Consults called: Gastroenterology  Family Communication: Admission, patients condition and plan of care including tests being ordered have been discussed with the patient and daughter who indicates understanding and agree with the plan and Code Status  Admission status: MedSurg observation  Disposition plan: Further plan will depend as patient's clinical course evolves and further radiologic and laboratory data become available.    At the time of admission, it appears that the appropriate admission  status for this patient is observation. This is judged to be reasonable and necessary in order to provide the required intensity of service to ensure the patient's safety given the presenting symptoms abdominal pain with ascites, fevers, physical exam findings, and initial radiographic and laboratory data in the context of their chronic comorbidities.  The medical decision making on this patient was of high complexity and the patient is at high risk for clinical deterioration, therefore this is a level 3 visit.   Time Spent on Admission: 60 minutes      M.D. [facility] 01/07/2019, 10:49 AM

## 2018-12-25 NOTE — ED Notes (Signed)
Pt going for paracentesis. Tech who came down to transport pt there states that she can take the pt to room 1510 after procedure.

## 2018-12-25 NOTE — ED Notes (Signed)
Bed: VO45 Expected date:  Expected time:  Means of arrival:  Comments: 39 yr abdominal pain

## 2018-12-25 NOTE — ED Notes (Signed)
Pt is aware a urine specimen that is needed and has a urinal at bedside to void.

## 2018-12-25 NOTE — ED Provider Notes (Signed)
  Physical Exam  BP 110/75   Pulse 92   Temp 99.4 F (37.4 C) (Rectal)   Resp 18   SpO2 97%   Physical Exam  ED Course/Procedures     Procedures  MDM  Received patient in signout.  History of colitis and hepatitis.  Abdomen now less tender than report.  However does have elevated white count of 27 and lactic acid of 6.  Still no fever.  And patient is on steroids.  Maintain blood pressure also.  We will give a fluid bolus get blood cultures and give a dose of Rocephin.  Elevated lactate is not necessarily from a septic shock and could just come from the colitis.  Will hold off on 30/kg bolus at this time since he has had previous issues with volume overload.  Patient CT reassuring.  Does have ascites.  Had been given empiric antibiotics and some fluid bolus.  Had had improvement in his abdominal exam.  Discussed with interventional radiology, who will do a paracentesis.  Admitted to hospitalist since her no available beds for the internal medicine resident.  Will give another liter of fluid since lactic acid continues to be elevated.  This will cover 30/kg bolus, although not within the first hour.  It was not clear that it was necessarily an infection initially as there was no fever and the white count could be elevated due to the steroids she had been on.       Davonna Belling, MD 01/14/2019 [phone]

## 2018-12-26 DIAGNOSIS — I313 Pericardial effusion (noninflammatory): Secondary | ICD-10-CM | POA: Diagnosis present

## 2018-12-26 DIAGNOSIS — G9341 Metabolic encephalopathy: Secondary | ICD-10-CM | POA: Diagnosis not present

## 2018-12-26 DIAGNOSIS — D684 Acquired coagulation factor deficiency: Secondary | ICD-10-CM | POA: Diagnosis present

## 2018-12-26 DIAGNOSIS — E872 Acidosis: Secondary | ICD-10-CM | POA: Diagnosis not present

## 2018-12-26 DIAGNOSIS — R1084 Generalized abdominal pain: Secondary | ICD-10-CM | POA: Diagnosis present

## 2018-12-26 DIAGNOSIS — B181 Chronic viral hepatitis B without delta-agent: Secondary | ICD-10-CM | POA: Diagnosis present

## 2018-12-26 DIAGNOSIS — E871 Hypo-osmolality and hyponatremia: Secondary | ICD-10-CM | POA: Diagnosis present

## 2018-12-26 DIAGNOSIS — R6520 Severe sepsis without septic shock: Secondary | ICD-10-CM

## 2018-12-26 DIAGNOSIS — K567 Ileus, unspecified: Secondary | ICD-10-CM | POA: Diagnosis not present

## 2018-12-26 DIAGNOSIS — K7460 Unspecified cirrhosis of liver: Secondary | ICD-10-CM

## 2018-12-26 DIAGNOSIS — N179 Acute kidney failure, unspecified: Secondary | ICD-10-CM | POA: Diagnosis present

## 2018-12-26 DIAGNOSIS — K767 Hepatorenal syndrome: Secondary | ICD-10-CM | POA: Diagnosis not present

## 2018-12-26 DIAGNOSIS — Z1624 Resistance to multiple antibiotics: Secondary | ICD-10-CM | POA: Diagnosis present

## 2018-12-26 DIAGNOSIS — J9601 Acute respiratory failure with hypoxia: Secondary | ICD-10-CM | POA: Diagnosis not present

## 2018-12-26 DIAGNOSIS — K754 Autoimmune hepatitis: Secondary | ICD-10-CM | POA: Diagnosis present

## 2018-12-26 DIAGNOSIS — K652 Spontaneous bacterial peritonitis: Secondary | ICD-10-CM

## 2018-12-26 DIAGNOSIS — Z515 Encounter for palliative care: Secondary | ICD-10-CM | POA: Diagnosis not present

## 2018-12-26 DIAGNOSIS — I8510 Secondary esophageal varices without bleeding: Secondary | ICD-10-CM | POA: Diagnosis present

## 2018-12-26 DIAGNOSIS — R188 Other ascites: Secondary | ICD-10-CM | POA: Diagnosis not present

## 2018-12-26 DIAGNOSIS — K7200 Acute and subacute hepatic failure without coma: Secondary | ICD-10-CM | POA: Diagnosis not present

## 2018-12-26 DIAGNOSIS — E440 Moderate protein-calorie malnutrition: Secondary | ICD-10-CM | POA: Diagnosis present

## 2018-12-26 DIAGNOSIS — Z1159 Encounter for screening for other viral diseases: Secondary | ICD-10-CM | POA: Diagnosis not present

## 2018-12-26 DIAGNOSIS — K766 Portal hypertension: Secondary | ICD-10-CM | POA: Diagnosis present

## 2018-12-26 DIAGNOSIS — K56609 Unspecified intestinal obstruction, unspecified as to partial versus complete obstruction: Secondary | ICD-10-CM | POA: Diagnosis not present

## 2018-12-26 DIAGNOSIS — R6521 Severe sepsis with septic shock: Secondary | ICD-10-CM | POA: Diagnosis not present

## 2018-12-26 DIAGNOSIS — A419 Sepsis, unspecified organism: Secondary | ICD-10-CM | POA: Diagnosis not present

## 2018-12-26 DIAGNOSIS — Z66 Do not resuscitate: Secondary | ICD-10-CM | POA: Diagnosis not present

## 2018-12-26 LAB — CBC
HCT: 30 % — ABNORMAL LOW (ref 39.0–52.0)
Hemoglobin: 9.7 g/dL — ABNORMAL LOW (ref 13.0–17.0)
MCH: 25.1 pg — ABNORMAL LOW (ref 26.0–34.0)
MCHC: 32.3 g/dL (ref 30.0–36.0)
MCV: 77.7 fL — ABNORMAL LOW (ref 80.0–100.0)
Platelets: 127 10*3/uL — ABNORMAL LOW (ref 150–400)
RBC: 3.86 MIL/uL — ABNORMAL LOW (ref 4.22–5.81)
RDW: 18.6 % — ABNORMAL HIGH (ref 11.5–15.5)
WBC: 37.8 10*3/uL — ABNORMAL HIGH (ref 4.0–10.5)
nRBC: 0 % (ref 0.0–0.2)

## 2018-12-26 LAB — GLUCOSE, CAPILLARY
Glucose-Capillary: 50 mg/dL — ABNORMAL LOW (ref 70–99)
Glucose-Capillary: 55 mg/dL — ABNORMAL LOW (ref 70–99)
Glucose-Capillary: 62 mg/dL — ABNORMAL LOW (ref 70–99)
Glucose-Capillary: 67 mg/dL — ABNORMAL LOW (ref 70–99)
Glucose-Capillary: 191 mg/dL — ABNORMAL HIGH (ref 70–99)
Glucose-Capillary: 193 mg/dL — ABNORMAL HIGH (ref 70–99)
Glucose-Capillary: 199 mg/dL — ABNORMAL HIGH (ref 70–99)

## 2018-12-26 LAB — HEMOGLOBIN A1C
Hgb A1c MFr Bld: 8 % — ABNORMAL HIGH (ref 4.8–5.6)
Mean Plasma Glucose: 183 mg/dL

## 2018-12-26 LAB — BASIC METABOLIC PANEL
Anion gap: 12 (ref 5–15)
Anion gap: 13 (ref 5–15)
BUN: 36 mg/dL — ABNORMAL HIGH (ref 6–20)
BUN: 42 mg/dL — ABNORMAL HIGH (ref 6–20)
CO2: 23 mmol/L (ref 22–32)
CO2: 22 mmol/L (ref 22–32)
Calcium: 8.9 mg/dL (ref 8.9–10.3)
Calcium: 8.4 mg/dL — ABNORMAL LOW (ref 8.9–10.3)
Chloride: 92 mmol/L — ABNORMAL LOW (ref 98–111)
Chloride: 89 mmol/L — ABNORMAL LOW (ref 98–111)
Creatinine, Ser: 2 mg/dL — ABNORMAL HIGH (ref 0.61–1.24)
Creatinine, Ser: 2.34 mg/dL — ABNORMAL HIGH (ref 0.61–1.24)
GFR calc Af Amer: 41 mL/min — ABNORMAL LOW (ref >60)
GFR calc Af Amer: 34 mL/min — ABNORMAL LOW (ref >60)
GFR calc non Af Amer: 35 mL/min — ABNORMAL LOW (ref >60)
GFR calc non Af Amer: 29 mL/min — ABNORMAL LOW (ref >60)
Glucose, Bld: 56 mg/dL — ABNORMAL LOW (ref 70–99)
Glucose, Bld: 190 mg/dL — ABNORMAL HIGH (ref 70–99)
Potassium: 5.9 mmol/L — ABNORMAL HIGH (ref 3.5–5.1)
Potassium: 5.7 mmol/L — ABNORMAL HIGH (ref 3.5–5.1)
Sodium: 127 mmol/L — ABNORMAL LOW (ref 135–145)
Sodium: 124 mmol/L — ABNORMAL LOW (ref 135–145)

## 2018-12-26 MED ORDER — ENTECAVIR 0.5 MG PO TABS
0.5000 mg | ORAL_TABLET | ORAL | Status: DC
  Administered 2018-12-28: 0.5 mg via ORAL

## 2018-12-26 MED ORDER — INSULIN GLARGINE 100 UNIT/ML ~~LOC~~ SOLN
5.0000 [IU] | Freq: Every day | SUBCUTANEOUS | Status: DC
  Administered 2018-12-26 – 2018-12-27 (×2): 5 [IU] via SUBCUTANEOUS
  Filled 2018-12-26 (×2): qty 0.05

## 2018-12-26 MED ORDER — METRONIDAZOLE IN NACL 5-0.79 MG/ML-% IV SOLN
500.0000 mg | Freq: Three times a day (TID) | INTRAVENOUS | Status: DC
  Administered 2018-12-26 – 2018-12-28 (×6): 500 mg via INTRAVENOUS
  Filled 2018-12-26 (×6): qty 100

## 2018-12-26 MED ORDER — MORPHINE SULFATE 15 MG PO TABS: 15.0000 mg | ORAL_TABLET | Freq: Four times a day (QID) | ORAL | Status: DC | PRN

## 2018-12-26 MED ORDER — PREDNISONE 10 MG PO TABS
10.0000 mg | ORAL_TABLET | Freq: Every day | ORAL | Status: DC
  Filled 2018-12-26: qty 1

## 2018-12-26 MED ORDER — DEXTROSE 50 % IV SOLN
INTRAVENOUS | Status: AC
  Administered 2018-12-26: 50 mL
  Filled 2018-12-26: qty 50

## 2018-12-26 MED ORDER — PREDNISONE 5 MG PO TABS
15.0000 mg | ORAL_TABLET | Freq: Every day | ORAL | Status: DC
  Administered 2018-12-26: 15 mg via ORAL
  Filled 2018-12-26: qty 1

## 2018-12-26 MED ORDER — LACTATED RINGERS IV BOLUS
500.0000 mL | Freq: Once | INTRAVENOUS | Status: AC
  Administered 2018-12-26: 500 mL via INTRAVENOUS

## 2018-12-26 NOTE — Progress Notes (Signed)
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  History was obtained with the assistance of patient's daughter at the bedside who translated in [place].  Patient describes the pain as diffuse, intermittent episodes, sharp and stabbing, 10/10 in the last 6 days.  He also noticed abdominal bloating, nausea, low-grade fever.  Patient reports he is feeling short of breath on lying, no chest pain, no cough.  Per daughter, one episode of diarrhea last night. Patient was seen in ED on 5/24 and was diagnosed with colitis, placed on ciprofloxacin and Flagyl which he has completed. Patient was also seen in internal medicine clinic on 6/3 and was instructed to continue spironolactone and furosemide, belly was soft and did not need paracentesis at that time.  COVID-19 negative   Assessment & Plan: 1-sepsis due to SBP -patient met sepsis criteria on admission with elevated WBC's, tachycardia and peritoneal cavity/SBP as source) -continue IV antibiotics -peritoneal fluid with positive gram neg rods, specificity pending -follow GI recommendations and clinical response -will give extra 500 bolus of IVF's -continue holding b-blocker and diuretics  2-Diabetes insulin dependent with hypoglycemia -will follow CBG's -continue modified carb diet -recent A1C 7.7 -continue adjusted SSI and lantus -hold oral hypoglycemic agents  3-Essential hypertension -BP is soft/borderline low -continue holding antihypertensive drugs  4-Autoimmune hepatitis ([place]) -continue steroids -will follow further recommendations by GI service  5-Hyponatremia/hyperkalemia -will continue  IVF"s -follow electrolytes trend and replete as needed  7-AKI -Cr up to 2 -with concerns for hepatorenal syndrome  -will follow trend -follow GI recommendations.  8-Cirrhosis of liver with ascites ([place]) -continue holding diuretics -will follow GI rec's   DVT prophylaxis: SCD's Code Status: full code. Family Communication: daughter at bedside  Disposition Plan: remains in the hospital; continue IV antibiotics and follow GI recommendations, follow electrolytes, renal function and clinical response. Very sick and meeting criteria for sepsis.  Consultants:   GI service.  Procedures:   See below for x-ray reports  -paracentesis (2.7 L removed on 12/28/2018)  Antimicrobials:  Anti-infectives (From admission, onward)   Start     Dose/Rate Route Frequency Ordered Stop   12/26/18 0800  cefTRIAXone (ROCEPHIN) 2 g in sodium chloride 0.9 % 100 mL IVPB     2 g 200 mL/hr over 30 Minutes Intravenous Every 24 hours 01/09/2019 1312     12/26/2018 1400  entecavir (BARACLUDE) tablet 0.5 mg     0.5 mg Oral Daily 01/07/2019 1312     01/07/2019 1330  cefTRIAXone (ROCEPHIN) 1 g in sodium chloride 0.9 % 100 mL IVPB     1 g 200 mL/hr over 30 Minutes Intravenous STAT 01/03/2019 1315 01/07/2019 1455   01/12/2019 0730  cefTRIAXone (ROCEPHIN) 1 g in sodium chloride 0.9 % 100 mL IVPB     1 g 200 mL/hr over 30 Minutes Intravenous  Once 01/15/2019 0727 01/18/2019 0835      Subjective: Afebrile, still with abd pain and poor appetite; experiencing active hypoglycemic events.   Objective: Vitals:   01/02/2019 1430 01/08/2019 2001 12/26/18 0550 12/26/18 0830  BP: 108/81 98/71 (!) 88/70   Pulse: (!) 109 92 71   Resp: (!) _0 Temp: 98.3 F (36.8 C) 98.6 F (37 C) (!)  97.5 F (36.4 C)   TempSrc:      SpO2: 94% 95% 94% 93%    Intake/Output Summary (Last 24 hours) at 12/26/2018 1009 Last data filed at 12/24/2018 2321 Gross per 24 hour  Intake 240 ml  Output 300 ml  Net -60 ml   There were no vitals filed for  this visit.  Examination: General exam: Alert, awake, oriented x 3; afebrile, reports no CP, no nausea, no vomiting. Still complaining of abd pain and decrease appetite. Respiratory system: Clear to auscultation. Respiratory effort normal. Cardiovascular system:RRR. No murmurs, rubs, gallops. Gastrointestinal system: Abdomen is distended, with positive fluid wave and tender to palpation. Normal bowel sounds heard. Central nervous system: Alert and oriented. No focal neurological deficits. Extremities: No Cyanosis or clubbing, trace pedal edema appreciated. Skin: No rashes, lesions or ulcers Psychiatry: Judgement and insight appear normal. Mood & affect appropriate.     Data Reviewed: I have personally reviewed following labs and imaging studies  CBC: Recent Labs  Lab 12/21/18 1042 01/13/2019 0643 12/26/18 0718  WBC 11.2* 26.9* 37.8*  NEUTROABS 9.4* 23.0*  --   HGB 10.2* 9.7* 9.7*  HCT 31.2* 28.8* 30.0*  MCV 78.0 76.2* 77.7*  PLT 133.0* 75* 268*   Basic Metabolic Panel: Recent Labs  Lab 12/21/18 1042 12/23/2018 0643 01/18/2019 1539 12/26/18 0718  NA 124* 122* 126* 127*  K 5.0 4.5 4.5 5.9*  CL 92* 90* 96* 92*  CO2 _0 GLUCOSE 344* 287* 140* 56*  BUN 22 24* 22* 36*  CREATININE 1.34 1.24 0.92 2.00*  CALCIUM 9.4 8.0* 8.2* 8.9   GFR: Estimated Creatinine Clearance: 34.6 mL/min (A) (by C-G formula based on SCr of 2 mg/dL (H)).   Liver Function Tests: Recent Labs  Lab 01/14/2019 0643 01/12/2019 1539  AST 27  --   ALT 29  --   ALKPHOS 53  --   BILITOT 9.5* 8.4*  PROT 7.1  --   ALBUMIN 2.1*  --    Recent Labs  Lab 01/10/2019 0643  LIPASE 38   Coagulation Profile: Recent Labs  Lab 01/08/2019 0643  INR 2.0*   Cardiac Enzymes: Recent Labs  Lab 01/18/2019 0644  TROPONINI <0.03   HbA1C: Recent Labs    01/11/2019 0643  HGBA1C 8.0*   CBG: Recent Labs  Lab 01/08/2019 2004 12/26/18 0716 12/26/18 0806 12/26/18 0845 12/26/18 0917  GLUCAP 83 50* 55* 62* 67*    Urine analysis:    Component Value Date/Time   COLORURINE AMBER (A) 01/17/2019 0624   APPEARANCEUR CLEAR 01/05/2019 0624   LABSPEC 1.021 01/04/2019 0624   PHURINE 6.0 01/15/2019 0624   GLUCOSEU >=500 (A) 01/07/2019 0624   HGBUR NEGATIVE 12/23/2018 0624   BILIRUBINUR SMALL (A) 12/28/2018 0624   KETONESUR NEGATIVE 01/07/2019 0624   PROTEINUR NEGATIVE 12/28/2018 0624   UROBILINOGEN 0.2 01/25/2008 1041   NITRITE NEGATIVE 01/15/2019 0624   LEUKOCYTESUR NEGATIVE 12/24/2018 0624    Recent Results (from the past 341 hour(s))  Helicobacter pylori special antigen     Status: None   Collection Time: 12/24/18  2:23 PM  Result Value Ref Range Status   MICRO NUMBER: [id number]  Final   SPECIMEN QUALITY Adequate  Final   SOURCE: STOOL  Final   STATUS: FINAL  Final   RESULT:   Final    Not Detected  Antimicrobials, proton pump inhibitors, and bismuth preparations inhibit H. pylori and ingestion up to two weeks prior to testing may cause false negative results. If  clinically indicated the test should be repeated on a new specimen  obtained two weeks after discontinuing treatment.   SARS Coronavirus 2 (CEPHEID - Performed in [place] hospital lab), Hosp Order     Status: None   Collection Time: 01/15/2019  6:42 AM  Result Value Ref Range Status   SARS Coronavirus 2 NEGATIVE NEGATIVE Final    Comment: (NOTE) If result is NEGATIVE SARS-CoV-2 target nucleic acids are NOT DETECTED. The SARS-CoV-2 RNA is generally detectable in upper and lower  respiratory specimens during the acute phase of infection. The lowest  concentration of SARS-CoV-2 viral copies this assay can detect is 250  copies / mL. A negative result does not preclude SARS-CoV-2 infection  and should not be used as the sole basis for treatment or other  patient management decisions.  A negative result may occur with  improper specimen collection / handling, submission of specimen other  than nasopharyngeal swab, presence of viral  mutation(s) within the  areas targeted by this assay, and inadequate number of viral copies  (<250 copies / mL). A negative result must be combined with clinical  observations, patient history, and epidemiological information. If result is POSITIVE SARS-CoV-2 target nucleic acids are DETECTED. The SARS-CoV-2 RNA is generally detectable in upper and lower  respiratory specimens dur ing the acute phase of infection.  Positive  results are indicative of active infection with SARS-CoV-2.  Clinical  correlation with patient history and other diagnostic information is  necessary to determine patient infection status.  Positive results do  not rule out bacterial infection or co-infection with other viruses. If result is PRESUMPTIVE POSTIVE SARS-CoV-2 nucleic acids MAY BE PRESENT.   A presumptive positive result was obtained on the submitted specimen  and confirmed on repeat testing.  While 2019 novel coronavirus  (SARS-CoV-2) nucleic acids may be present in the submitted sample  additional confirmatory testing may be necessary for epidemiological  and / or clinical management purposes  to differentiate between  SARS-CoV-2 and other Sarbecovirus currently known to infect humans.  If clinically indicated additional testing with an alternate test  methodology [phone]) is advised. The SARS-CoV-2 RNA is generally  detectable in upper and lower respiratory sp ecimens during the acute  phase of infection. The expected result is Negative. Fact Sheet for Patients:  StrictlyIdeas.no Fact Sheet for Healthcare Providers: BankingDealers.co.za This test is not yet approved or cleared by the [place] FDA and has been authorized for detection and/or diagnosis of SARS-CoV-2 by FDA under an Emergency Use Authorization (EUA).  This EUA will remain in effect (meaning this test can be used) for the duration of the COVID-19 declaration under Section 564(b)(1)  of the Act, 21 U.S.C. section 360bbb-3(b)(1), unless the authorization is terminated or revoked sooner. Performed at [facility], [place] [address], [place], [place] [address]   Culture, blood (routine x 2)     Status: None (Preliminary result)   Collection Time: 12/23/2018  7:40 AM  Result Value Ref Range Status   Specimen Description   Final    BLOOD LEFT HAND Performed at [facility], [place] [address] [place], [place] [address]    Special Requests   Final    BOTTLES DRAWN AEROBIC AND ANAEROBIC Blood Culture adequate volume Performed at [facility] [address] [place], [place] [address]    Culture   Final    NO GROWTH < 24 HOURS Performed at [place],  [place] [address]    Report Status PENDING  Incomplete  Culture, blood (routine x 2)     Status: None (Preliminary result)   Collection Time: 01/18/2019  7:50 AM  Result Value Ref Range Status   Specimen Description   Final    BLOOD BLOOD LEFT WRIST Performed at [facility] [address] [place], [place] [address]    Special Requests   Final    BOTTLES DRAWN AEROBIC AND ANAEROBIC Blood Culture results may not be optimal due to an excessive volume of blood received in culture bottles Performed at [address] [place], [place] [address]    Culture   Final    NO GROWTH < 24 HOURS Performed at [facility] [address], [place], [place] [address]    Report Status PENDING  Incomplete  Culture, body fluid-bottle     Status: None (Preliminary result)   Collection Time: 12/24/2018 12:44 PM  Result Value Ref Range Status   Specimen Description PERITONEAL  Final   Special Requests NONE  Final   Gram Stain   Final    GRAM NEGATIVE RODS IN BOTH AEROBIC AND ANAEROBIC BOTTLES CRITICAL RESULT CALLED TO, READ BACK BY AND VERIFIED WITH: JOHNSON AT [place] [id number] BY SJW Performed at [facility], [place] [address] [place], [place] [address]    Culture GRAM NEGATIVE RODS  Final   Report Status PENDING  Incomplete  Gram stain     Status: None   Collection Time: 01/17/2019 12:44 PM  Result Value Ref Range Status   Specimen Description PERITONEAL  Final   Special Requests NONE  Final   Gram Stain   Final    ABUNDANT WBC PRESENT,BOTH PMN AND MONONUCLEAR NO ORGANISMS SEEN Performed at [facility], [address], [place], [place] [address]    Report Status 01/08/2019 FINAL  Final     Radiology Studies: Dg Chest 2 View  Result Date: 12/23/2018 CLINICAL DATA:  Shortness of breath EXAM: CHEST - 2 VIEW COMPARISON:  12/13/2018 FINDINGS: Low volume chest with streaky opacity at the bases. Normal heart size when accounting for lung volumes. No Kerley lines, effusion, or pneumothorax. IMPRESSION: Low volume chest with atelectasis. Electronically Signed   By: Monte Fantasia M.D.   On: 01/08/2019 06:22   Ct Abdomen Pelvis W Contrast  Result Date: 01/03/2019 CLINICAL DATA:  Abdominal pain EXAM: CT ABDOMEN AND PELVIS WITH CONTRAST TECHNIQUE: Multidetector CT imaging of the abdomen and pelvis was performed using the standard protocol following bolus administration of intravenous contrast. CONTRAST:  176m OMNIPAQUE IOHEXOL 300 MG/ML  SOLN COMPARISON:  12/13/2018 FINDINGS: Lower chest: Trace bilateral effusions.  Heart is normal size. Hepatobiliary: Cirrhosis with nodular shrunken liver. Gallbladder is distended. No visible stones. No biliary ductal dilatation. Pancreas: No focal abnormality or ductal dilatation. Spleen: Splenomegaly with a craniocaudal length 18 cm. No focal abnormality. Adrenals/Urinary Tract: No adrenal abnormality. No focal renal abnormality. No stones or hydronephrosis. Urinary bladder is unremarkable. Stomach/Bowel: Stomach, large and small bowel grossly unremarkable. Vascular/Lymphatic: Aortic atherosclerosis. No enlarged abdominal or pelvic lymph nodes. Reproductive:  No focal abnormality. Other: Large volume ascites in the abdomen and pelvis. This is increased since prior study. Musculoskeletal: No acute bony abnormality. IMPRESSION: Cirrhosis with associated splenomegaly and enlarging large abdominal and pelvic ascites. Trace bilateral pleural effusions. Aortic atherosclerosis. Electronically Signed   By: KRolm BaptiseM.D.   On: 01/02/2019 09:47   [place]acentesis  Result Date: 12/24/2018 INDICATION: Patient with history of autoimmune hepatitis, cirrhosis, ascites. Request made for diagnostic and therapeutic paracentesis. EXAM: ULTRASOUND GUIDED DIAGNOSTIC AND THERAPEUTIC PARACENTESIS MEDICATIONS: None COMPLICATIONS: None immediate. PROCEDURE: Informed written consent was obtained from the patient/daughter after a discussion of the risks, benefits and alternatives to treatment. A timeout was performed prior to the initiation of the procedure. Initial ultrasound scanning demonstrates a moderate amount of ascites within the right upper to mid abdominal quadrant. The right upper to mid abdomen was prepped and draped in the usual sterile fashion. 1% lidocaine was used for local anesthesia. Following this, a 19 gauge, 7-cm, Yueh catheter was introduced. An ultrasound image was saved for documentation purposes. The paracentesis was performed. The catheter was removed and a dressing was applied. The patient tolerated the procedure well without immediate post procedural complication. FINDINGS: A total of approximately 2.7 liters of turbid, yellow fluid was removed. Samples were sent to the laboratory as requested by the clinical team. IMPRESSION: Successful ultrasound-guided diagnostic and therapeutic paracentesis yielding 2.7 liters of peritoneal fluid. Read by: Rowe Robert, PA-C Electronically Signed   By: Sandi Mariscal M.D.   On: 12/27/2018 12:41    Scheduled Meds:  entecavir  0.5 mg Oral Daily   insulin aspart  0-9 Units Subcutaneous TID WC   insulin glargine  5 Units  Subcutaneous Q2200   pantoprazole  40 mg Oral Daily   predniSONE  15 mg Oral Q breakfast   Continuous Infusions:  [START ON 12/27/2018] albumin human     cefTRIAXone (ROCEPHIN)  IV 2 g (12/26/18 0846)     LOS: 0 days    Time spent: 35 minutes. Greater than 50% of this time was spent in direct contact with the patient, coordinating care and discussing relevant ongoing clinical issues, including discussion with patient and daughter at bedside regarding sepsis due to SBP, cirrhosis and AKI. Patient hypoglycemic event this morning and still complaining of abd pain and decrease oral intake. Plan of care discussed with nursing staff, will follow recommendations by GI service.    Barton Dubois, MD [facility] Pager [phone]   12/26/2018, 10:09 AM

## 2018-12-26 NOTE — Progress Notes (Signed)
CRITICAL VALUE ALERT  Critical Value: gram negative rod in peritoneal fluid, CBG 50  Date & Time Notied: 12/26/18 @ 0700  Provider Notified: yes  Orders Received/Actions taken: yes

## 2018-12-26 NOTE — Progress Notes (Signed)
Progress Note   Subjective  Patient states he thinks he feels a bit better but labs worse today. No fevers, stable hemodynamics. Peritoneal fluid grew gram negative rods.    Objective   Vital signs in last 24 hours: Temp:  [97.5 F (36.4 C)-98.6 F (37 C)] 97.7 F (36.5 C) (06/06 1441) Pulse Rate:  [71-92] 76 (06/06 1441) Resp:  [16-20] 16 (06/06 1441) BP: (83-98)/(58-71) 83/62 (06/06 1441) SpO2:  [93 %-96 %] 96 % (06/06 1441) Last BM Date: 12/23/18 General:    Asian male in NAD Heart:  Regular rate and rhythm; no murmurs Lungs: Respirations even and unlabored, lungs CTA bilaterally Abdomen:  Soft, mild diffuse tenderness, soft Extremities:  Without edema. Neurologic:  Alert and oriented,  grossly normal neurologically. Psych:  Cooperative. Normal mood and affect.  Intake/Output from previous day: 06/05 0701 - 06/06 0700 In: 1345.3 [P.O.:240; IV Piggyback:1105.3] Out: 300 [Urine:300] Intake/Output this shift: No intake/output data recorded.  Lab Results: Recent Labs    01/14/2019 0643 12/26/18 0718  WBC 26.9* 37.8*  HGB 9.7* 9.7*  HCT 28.8* 30.0*  PLT 75* 127*   BMET Recent Labs    01/01/2019 0643 01/06/2019 1539 12/26/18 0718  NA 122* 126* 127*  K 4.5 4.5 5.9*  CL 90* 96* 92*  CO2 22 23 23   GLUCOSE 287* 140* 56*  BUN 24* 22* 36*  CREATININE 1.24 0.92 2.00*  CALCIUM 8.0* 8.2* 8.9   LFT Recent Labs    12/21/2018 0643 01/05/2019 1539  PROT 7.1  --   ALBUMIN 2.1*  --   AST 27  --   ALT 29  --   ALKPHOS 53  --   BILITOT 9.5* 8.4*  BILIDIR  --  4.9*  IBILI  --  3.5*   PT/INR Recent Labs    01/05/2019 0643  LABPROT 22.3*  INR 2.0*    Studies/Results: Dg Chest 2 View  Result Date: 01/14/2019 CLINICAL DATA:  Shortness of breath EXAM: CHEST - 2 VIEW COMPARISON:  12/13/2018 FINDINGS: Low volume chest with streaky opacity at the bases. Normal heart size when accounting for lung volumes. No Kerley lines, effusion, or pneumothorax. IMPRESSION: Low  volume chest with atelectasis. Electronically Signed   By: Monte Fantasia M.D.   On: 12/26/2018 06:22   Ct Abdomen Pelvis W Contrast  Result Date: 01/17/2019 CLINICAL DATA:  Abdominal pain EXAM: CT ABDOMEN AND PELVIS WITH CONTRAST TECHNIQUE: Multidetector CT imaging of the abdomen and pelvis was performed using the standard protocol following bolus administration of intravenous contrast. CONTRAST:  157mL OMNIPAQUE IOHEXOL 300 MG/ML  SOLN COMPARISON:  12/13/2018 FINDINGS: Lower chest: Trace bilateral effusions.  Heart is normal size. Hepatobiliary: Cirrhosis with nodular shrunken liver. Gallbladder is distended. No visible stones. No biliary ductal dilatation. Pancreas: No focal abnormality or ductal dilatation. Spleen: Splenomegaly with a craniocaudal length 18 cm. No focal abnormality. Adrenals/Urinary Tract: No adrenal abnormality. No focal renal abnormality. No stones or hydronephrosis. Urinary bladder is unremarkable. Stomach/Bowel: Stomach, large and small bowel grossly unremarkable. Vascular/Lymphatic: Aortic atherosclerosis. No enlarged abdominal or pelvic lymph nodes. Reproductive: No focal abnormality. Other: Large volume ascites in the abdomen and pelvis. This is increased since prior study. Musculoskeletal: No acute bony abnormality. IMPRESSION: Cirrhosis with associated splenomegaly and enlarging large abdominal and pelvic ascites. Trace bilateral pleural effusions. Aortic atherosclerosis. Electronically Signed   By: Rolm Baptise M.D.   On: 01/14/2019 09:47   US Paracentesis  Result Date: 01/15/2019 INDICATION: Patient with history of autoimmune  hepatitis, cirrhosis, ascites. Request made for diagnostic and therapeutic paracentesis. EXAM: ULTRASOUND GUIDED DIAGNOSTIC AND THERAPEUTIC PARACENTESIS MEDICATIONS: None COMPLICATIONS: None immediate. PROCEDURE: Informed written consent was obtained from the patient/daughter after a discussion of the risks, benefits and alternatives to treatment. A  timeout was performed prior to the initiation of the procedure. Initial ultrasound scanning demonstrates a moderate amount of ascites within the right upper to mid abdominal quadrant. The right upper to mid abdomen was prepped and draped in the usual sterile fashion. 1% lidocaine was used for local anesthesia. Following this, a 19 gauge, 7-cm, Yueh catheter was introduced. An ultrasound image was saved for documentation purposes. The paracentesis was performed. The catheter was removed and a dressing was applied. The patient tolerated the procedure well without immediate post procedural complication. FINDINGS: A total of approximately 2.7 liters of turbid, yellow fluid was removed. Samples were sent to the laboratory as requested by the clinical team. IMPRESSION: Successful ultrasound-guided diagnostic and therapeutic paracentesis yielding 2.7 liters of peritoneal fluid. Read by: Rowe Robert, PA-C Electronically Signed   By: Sandi Mariscal M.D.   On: 12/24/2018 12:41       Assessment / Plan:    58 y/o [name] with a history of recently diagnosed autoimmune hepatitis with cirrhosis complicated by esophageal varices and ascites, awaiting Hepatology consult (scheduled within 2 weeks), admitted with worsening abdominal pain and jaundice, found to have severe SBP with marked leukocytosis. Peritoneal fluid growing gram (-) rods, species / sensitivities pending. We do not have cefotaxime, on high dose ceftriaxone. Unfortunately has developed renal failure despite albumin given yesterday. Complicating this is that he has been on steroids several weeks for autoimmune hepatitis.  He is not febrile, some soft BPs but otherwise stable, have low threshold to transfer to ICU with any worsening.  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  Call with questions, will follow.  [place] Cellar, MD [facility]

## 2018-12-27 ENCOUNTER — Inpatient Hospital Stay (HOSPITAL_COMMUNITY): Payer: Medicaid Other

## 2018-12-27 ENCOUNTER — Other Ambulatory Visit: Payer: Self-pay

## 2018-12-27 DIAGNOSIS — K7469 Other cirrhosis of liver: Secondary | ICD-10-CM

## 2018-12-27 DIAGNOSIS — N179 Acute kidney failure, unspecified: Secondary | ICD-10-CM

## 2018-12-27 DIAGNOSIS — K7200 Acute and subacute hepatic failure without coma: Secondary | ICD-10-CM

## 2018-12-27 LAB — CBC
HCT: 31.7 % — ABNORMAL LOW (ref 39.0–52.0)
Hemoglobin: 10.1 g/dL — ABNORMAL LOW (ref 13.0–17.0)
MCH: 24.6 pg — ABNORMAL LOW (ref 26.0–34.0)
MCHC: 31.9 g/dL (ref 30.0–36.0)
MCV: 77.3 fL — ABNORMAL LOW (ref 80.0–100.0)
Platelets: 139 10*3/uL — ABNORMAL LOW (ref 150–400)
RBC: 4.1 MIL/uL — ABNORMAL LOW (ref 4.22–5.81)
RDW: 17.9 % — ABNORMAL HIGH (ref 11.5–15.5)
WBC: 31.2 10*3/uL — ABNORMAL HIGH (ref 4.0–10.5)
nRBC: 0 % (ref 0.0–0.2)

## 2018-12-27 LAB — BASIC METABOLIC PANEL
Anion gap: 13 (ref 5–15)
Anion gap: 14 (ref 5–15)
BUN: 52 mg/dL — ABNORMAL HIGH (ref 6–20)
BUN: 62 mg/dL — ABNORMAL HIGH (ref 6–20)
CO2: 21 mmol/L — ABNORMAL LOW (ref 22–32)
CO2: 24 mmol/L (ref 22–32)
Calcium: 8.2 mg/dL — ABNORMAL LOW (ref 8.9–10.3)
Calcium: 7.8 mg/dL — ABNORMAL LOW (ref 8.9–10.3)
Chloride: 89 mmol/L — ABNORMAL LOW (ref 98–111)
Chloride: 86 mmol/L — ABNORMAL LOW (ref 98–111)
Creatinine, Ser: 3.31 mg/dL — ABNORMAL HIGH (ref 0.61–1.24)
Creatinine, Ser: 3.61 mg/dL — ABNORMAL HIGH (ref 0.61–1.24)
GFR calc Af Amer: 22 mL/min — ABNORMAL LOW (ref >60)
GFR calc Af Amer: 20 mL/min — ABNORMAL LOW (ref >60)
GFR calc non Af Amer: 19 mL/min — ABNORMAL LOW (ref >60)
GFR calc non Af Amer: 17 mL/min — ABNORMAL LOW (ref >60)
Glucose, Bld: 163 mg/dL — ABNORMAL HIGH (ref 70–99)
Glucose, Bld: 467 mg/dL — ABNORMAL HIGH (ref 70–99)
Potassium: 6.4 mmol/L (ref 3.5–5.1)
Potassium: 5.6 mmol/L — ABNORMAL HIGH (ref 3.5–5.1)
Sodium: 123 mmol/L — ABNORMAL LOW (ref 135–145)
Sodium: 124 mmol/L — ABNORMAL LOW (ref 135–145)

## 2018-12-27 LAB — HEPATIC FUNCTION PANEL
ALT: 425 U/L — ABNORMAL HIGH (ref 0–44)
AST: 395 U/L — ABNORMAL HIGH (ref 15–41)
Albumin: 2.9 g/dL — ABNORMAL LOW (ref 3.5–5.0)
Alkaline Phosphatase: 102 U/L (ref 38–126)
Bilirubin, Direct: 18.6 mg/dL — ABNORMAL HIGH (ref 0.0–0.2)
Total Bilirubin: 22.5 mg/dL (ref 0.3–1.2)
Total Protein: 6.3 g/dL — ABNORMAL LOW (ref 6.5–8.1)

## 2018-12-27 LAB — PROTEIN / CREATININE RATIO, URINE
Creatinine, Urine: 151.71 mg/dL
Protein Creatinine Ratio: 0.22 mg/mg{Cre} — ABNORMAL HIGH (ref 0.00–0.15)
Total Protein, Urine: 33 mg/dL

## 2018-12-27 LAB — BODY FLUID CELL COUNT WITH DIFFERENTIAL
Eos, Fluid: 0 %
Lymphs, Fluid: 11 %
Monocyte-Macrophage-Serous Fluid: 15 % — ABNORMAL LOW (ref 50–90)
Neutrophil Count, Fluid: 74 % — ABNORMAL HIGH (ref 0–25)
Total Nucleated Cell Count, Fluid: 10550 cu mm — ABNORMAL HIGH (ref 0–1000)

## 2018-12-27 LAB — GLUCOSE, CAPILLARY
Glucose-Capillary: 156 mg/dL — ABNORMAL HIGH (ref 70–99)
Glucose-Capillary: 171 mg/dL — ABNORMAL HIGH (ref 70–99)
Glucose-Capillary: 184 mg/dL — ABNORMAL HIGH (ref 70–99)
Glucose-Capillary: 243 mg/dL — ABNORMAL HIGH (ref 70–99)

## 2018-12-27 LAB — URINE CULTURE

## 2018-12-27 LAB — TYPE AND SCREEN
ABO/RH(D): A POS
Antibody Screen: NEGATIVE

## 2018-12-27 LAB — PROTIME-INR
INR: 4.1 (ref 0.8–1.2)
Prothrombin Time: 38.7 seconds — ABNORMAL HIGH (ref 11.4–15.2)

## 2018-12-27 LAB — SODIUM, URINE, RANDOM: Sodium, Ur: <10 mmol/L

## 2018-12-27 LAB — CREATININE, URINE, RANDOM: Creatinine, Urine: 152.46 mg/dL

## 2018-12-27 LAB — AMMONIA: Ammonia: 71 umol/L — ABNORMAL HIGH (ref 9–35)

## 2018-12-27 LAB — CORTISOL: Cortisol, Plasma: 38.9 ug/dL

## 2018-12-27 LAB — MRSA PCR SCREENING: MRSA by PCR: NEGATIVE

## 2018-12-27 MED ORDER — SODIUM ZIRCONIUM CYCLOSILICATE 10 G PO PACK
20.0000 g | PACK | Freq: Two times a day (BID) | ORAL | Status: DC
  Filled 2018-12-27: qty 2

## 2018-12-27 MED ORDER — SODIUM CHLORIDE 0.9 % IV SOLN
50.0000 ug/h | INTRAVENOUS | Status: DC
  Administered 2018-12-27 – 2018-12-30 (×6): 50 ug/h via INTRAVENOUS
  Filled 2018-12-27 (×8): qty 1

## 2018-12-27 MED ORDER — SODIUM BICARBONATE 8.4 % IV SOLN
INTRAVENOUS | Status: DC
  Administered 2018-12-27 – 2018-12-28 (×3): via INTRAVENOUS
  Filled 2018-12-27 (×5): qty 150

## 2018-12-27 MED ORDER — DEXTROSE 50 % IV SOLN
1.0000 | Freq: Once | INTRAVENOUS | Status: AC
  Administered 2018-12-27: 50 mL via INTRAVENOUS
  Filled 2018-12-27: qty 50

## 2018-12-27 MED ORDER — MIDODRINE HCL 2.5 MG PO TABS: 2.5000 mg | ORAL_TABLET | Freq: Three times a day (TID) | ORAL | Status: DC

## 2018-12-27 MED ORDER — ALBUTEROL SULFATE (2.5 MG/3ML) 0.083% IN NEBU: 2.5000 mg | INHALATION_SOLUTION | RESPIRATORY_TRACT | Status: AC

## 2018-12-27 MED ORDER — LIDOCAINE HCL 1 % IJ SOLN
INTRAMUSCULAR | Status: AC
  Filled 2018-12-27: qty 10

## 2018-12-27 MED ORDER — SODIUM CHLORIDE 0.9 % IV SOLN
1.0000 g | Freq: Two times a day (BID) | INTRAVENOUS | Status: DC
  Administered 2018-12-27 – 2018-12-28 (×2): 1 g via INTRAVENOUS
  Filled 2018-12-27 (×3): qty 1

## 2018-12-27 MED ORDER — SODIUM POLYSTYRENE SULFONATE 15 GM/60ML PO SUSP
30.0000 g | Freq: Once | ORAL | Status: AC
  Administered 2018-12-27: 30 g via ORAL
  Filled 2018-12-27: qty 120

## 2018-12-27 MED ORDER — SODIUM ZIRCONIUM CYCLOSILICATE 10 G PO PACK
10.0000 g | PACK | Freq: Three times a day (TID) | ORAL | Status: DC
  Administered 2018-12-27 – 2018-12-29 (×6): 10 g via ORAL
  Filled 2018-12-27 (×7): qty 1

## 2018-12-27 MED ORDER — BOOST / RESOURCE BREEZE PO LIQD CUSTOM
1.0000 | Freq: Two times a day (BID) | ORAL | Status: DC
  Administered 2018-12-27 – 2018-12-28 (×2): 1 via ORAL

## 2018-12-27 MED ORDER — PHYTONADIONE 5 MG PO TABS
10.0000 mg | ORAL_TABLET | Freq: Once | ORAL | Status: DC
  Filled 2018-12-27 (×2): qty 2

## 2018-12-27 MED ORDER — ALBUMIN HUMAN 25 % IV SOLN
65.0000 g | Freq: Once | INTRAVENOUS | Status: AC
  Administered 2018-12-27: 65 g via INTRAVENOUS
  Filled 2018-12-27: qty 300

## 2018-12-27 MED ORDER — PRO-STAT SUGAR FREE PO LIQD
30.0000 mL | Freq: Two times a day (BID) | ORAL | Status: DC
  Administered 2018-12-28 – 2018-12-29 (×3): 30 mL via ORAL
  Filled 2018-12-27 (×4): qty 30

## 2018-12-27 MED ORDER — INSULIN ASPART 100 UNIT/ML ~~LOC~~ SOLN
6.0000 [IU] | Freq: Once | SUBCUTANEOUS | Status: AC
  Administered 2018-12-27: 6 [IU] via SUBCUTANEOUS

## 2018-12-27 MED ORDER — LACTULOSE 10 GM/15ML PO SOLN
10.0000 g | Freq: Two times a day (BID) | ORAL | Status: DC | PRN
  Administered 2018-12-28: 10 g via ORAL
  Filled 2018-12-27: qty 15

## 2018-12-27 MED ORDER — MIDODRINE HCL 5 MG PO TABS
7.5000 mg | ORAL_TABLET | Freq: Three times a day (TID) | ORAL | Status: DC
  Administered 2018-12-27 (×2): 7.5 mg via ORAL
  Filled 2018-12-27: qty 2
  Filled 2018-12-27 (×2): qty 1.5

## 2018-12-27 NOTE — Progress Notes (Signed)
[facility] Progress Note  CC:  Hepatitis B/Autoimmune cirrhosis   Subjective: Daughter [name] at bed side to interpret. Abdominal pain persists, not severe. Mild nausea. No vomiting. No BM since admitted to hospital.  Patient to transfer to ICU/ICU step down due to worsening renal function, markedly elevated LFTs and T. Bilirubin levels since yesterday.  Objective:  Vital signs in last 24 hours: Temp:  [97.7 F (36.5 C)-98.5 F (36.9 C)] 98.4 F (36.9 C) (06/07 0511) Pulse Rate:  [76-84] 84 (06/07 0511) Resp:  [16-20] 18 (06/07 0511) BP: (83-117)/(58-64) 117/64 (06/07 0511) SpO2:  [93 %-96 %] 93 % (06/07 0511) Last BM Date: 12/27/18 General: Alert, ill appearing male. Eyes: sclera is moderately icteric. Heart: RRR, no murmurs. Pulm: Lungs clear throughout, no wheezes or crackles. Abdomen: Distended, + ascites but not tense, moderated generalized tenderness without rebound or guarding. Extremities:  Without edema. Neurologic:  Alert and  oriented x4;  grossly normal neurologically. No asterixis. Psych:  Alert and cooperative. Normal mood and affect.  Intake/Output from previous day: 06/06 0701 - 06/07 0700 In: 300.1 [IV Piggyback:300.1] Out: -  Intake/Output this shift: No intake/output data recorded.  Lab Results: Recent Labs    01/14/2019 0643 12/26/18 0718 12/27/18 0602  WBC 26.9* 37.8* 31.2*  HGB 9.7* 9.7* 10.1*  HCT 28.8* 30.0* 31.7*  PLT 75* 127* 139*   BMET Recent Labs    12/26/18 0718 12/26/18 1507 12/27/18 0602  NA 127* 124* 123*  K 5.9* 5.7* 6.4*  CL 92* 89* 89*  CO2 23 22 21*  GLUCOSE 56* 190* 163*  BUN 36* 42* 52*  CREATININE 2.00* 2.34* 3.31*  CALCIUM 8.9 8.4* 8.2*   LFT Recent Labs    12/27/18 0602  PROT 6.3*  ALBUMIN 2.9*  AST 395*  ALT 425*  ALKPHOS 102  BILITOT 22.5*  BILIDIR PENDING  IBILI NOT CALCULATED   PT/INR Recent Labs    01/06/2019 0643  LABPROT 22.3*  INR 2.0*   Hepatitis Panel No results for  input(s): HEPBSAG, HCVAB, HEPAIGM, HEPBIGM in the last 72 hours.  Ct Abdomen Pelvis W Contrast  Result Date: 01/11/2019 CLINICAL DATA:  Abdominal pain EXAM: CT ABDOMEN AND PELVIS WITH CONTRAST TECHNIQUE: Multidetector CT imaging of the abdomen and pelvis was performed using the standard protocol following bolus administration of intravenous contrast. CONTRAST:  170m OMNIPAQUE IOHEXOL 300 MG/ML  SOLN COMPARISON:  12/13/2018 FINDINGS: Lower chest: Trace bilateral effusions.  Heart is normal size. Hepatobiliary: Cirrhosis with nodular shrunken liver. Gallbladder is distended. No visible stones. No biliary ductal dilatation. Pancreas: No focal abnormality or ductal dilatation. Spleen: Splenomegaly with a craniocaudal length 18 cm. No focal abnormality. Adrenals/Urinary Tract: No adrenal abnormality. No focal renal abnormality. No stones or hydronephrosis. Urinary bladder is unremarkable. Stomach/Bowel: Stomach, large and small bowel grossly unremarkable. Vascular/Lymphatic: Aortic atherosclerosis. No enlarged abdominal or pelvic lymph nodes. Reproductive: No focal abnormality. Other: Large volume ascites in the abdomen and pelvis. This is increased since prior study. Musculoskeletal: No acute bony abnormality. IMPRESSION: Cirrhosis with associated splenomegaly and enlarging large abdominal and pelvic ascites. Trace bilateral pleural effusions. Aortic atherosclerosis. Electronically Signed   By: KRolm BaptiseM.D.   On: 12/24/2018 09:47   [place]  Result Date: 01/03/2019 INDICATION: Patient with history of autoimmune hepatitis, cirrhosis, ascites. Request made for diagnostic and therapeutic paracentesis. EXAM: ULTRASOUND GUIDED DIAGNOSTIC AND THERAPEUTIC PARACENTESIS MEDICATIONS: None COMPLICATIONS: None immediate. PROCEDURE: Informed written consent was obtained from the patient/daughter after a discussion of the risks,  benefits and alternatives to treatment. A timeout was performed prior to the initiation  of the procedure. Initial ultrasound scanning demonstrates a moderate amount of ascites within the right upper to mid abdominal quadrant. The right upper to mid abdomen was prepped and draped in the usual sterile fashion. 1% lidocaine was used for local anesthesia. Following this, a 19 gauge, 7-cm, Yueh catheter was introduced. An ultrasound image was saved for documentation purposes. The paracentesis was performed. The catheter was removed and a dressing was applied. The patient tolerated the procedure well without immediate post procedural complication. FINDINGS: A total of approximately 2.7 liters of turbid, yellow fluid was removed. Samples were sent to the laboratory as requested by the clinical team. IMPRESSION: Successful ultrasound-guided diagnostic and therapeutic paracentesis yielding 2.7 liters of peritoneal fluid. Read by: Rowe Robert, PA-C Electronically Signed   By: Sandi Mariscal M.D.   On: 12/23/2018 12:41    Assessment / Plan:  1. 5.9. 7.0. 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 Admitted with worsening generalized abdominal pain, ascites and jaundice. P aracentesis 6/5 identified severe SBP, peritoneal fluid grew gram - rods on Rochephin 2gm IV Q 24 hrs and Flagyl 563m IV Q 8 hrs. His LFTs and  T. Bili levels spiked over the past 24 hrs. Alk Phos 102. AST 395 >> 27. ALT 425 >> 29.T. Bili 22.5 >> 8.4. Today's INR pending. WBC 31.2 << 37.8. He is afebrile. MELD-Na 38. -pt to transfer to ICU, arrangements in process -hold Prednisone -continue Rocephin and Flagyl IV -Albumin 1gm/KG =65gms IV now (patient received Albumin 1.5639mkg on 6/5) -Stats RUQ sonogram to assess for biliary obstruction -Baraclude 0.39m1mo QOD for now -Pain management per hospitalist -? Transplant evaluation  2.  Hyponatremia secondary to cirrhosis, diuretics were discontinued. Na 123.   3. Acute Renal Injury, multifactorial due to sepsis, ? Drug  induced, ? underlying renal dz secondary to chronic Hep B, ? Developing HRS. Cr. 3.31 up from 2.34. GFR 19.  -nephrology consult discussed with hospitalist  4. Hyperkalemia secondary to worsening renal function.  K+ 6.4. Patient now on telemetry to assess for arrhythmias.  -management per hospitalist   5. Anemia, thrombocytopenia secondary to cirrhosis. Hg 10.1. HCT 31.7. PLT 139.  Further recommendations per Dr. ArmHavery Moros   LOS: 1 day   [place]/01/2019, 8:49 AM

## 2018-12-27 NOTE — Progress Notes (Signed)
Initial Nutrition Assessment  RD working remotely.   DOCUMENTATION CODES:   (unable to assess for malnutrition at this time.)  INTERVENTION:  - will order Boost Breeze BID, each supplement provides 250 kcal and 9 grams of protein. -wWill order 30 mL Prostat BID, each supplement provides 100 kcal and 15 grams of protein. - continue to encourage PO intakes.  - weigh patient today.    NUTRITION DIAGNOSIS:   Increased nutrient needs related to chronic illness as evidenced by estimated needs.  GOAL:   Patient will meet greater than or equal to 90% of their needs  MONITOR:   PO intake, Supplement acceptance, Labs, Weight trends, I & O's  REASON FOR ASSESSMENT:   Consult Assessment of nutrition requirement/status  ASSESSMENT:   58 year old Vietnamese-speaking male with history of autoimmune hepatitis (recently confirmed on liver biopsy) on prednisone, liver cirrhosis, thrombocytopenia, DM, and HTN. He presented to ED with worsening abdominal pain x1 week. History was obtained in the ED with the assistance of patient's daughter at the bedside who translated. Patient described the pain as diffuse, intermittent episodes, sharp and stabbing, 10/10 in the last 6 days. He also noticed abdominal bloating, nausea, and low-grade fever. Per daughter, one episode of diarrhea the night PTA. Patient had been seen in the ED on 5/24 and was diagnosed with colitis and started on Cipro and flagyl; has now finished antibiotics course. Patient was seen at the Internal Medicine clinic on 6/3 and was instructed to continue spironolactone and furosemide, belly was soft and did not need paracentesis at that time. COVID-19 negative in the ED.  Patient speaks [place]. Per RN flow sheet, he consumed 50% of lunch on the day of admission (6/5) and no other intakes documented since that time. Patient was weighed on 6/3 (PTA) at which time he was 140 lb. Per chart review, he we weighed 143 lb on 4/16 which  indicates 3 lb weight loss (2.1% body weight) in 1.5 months. Will monitor weight trends closely given cirrhosis. Patient on enteric precautions.   Per notes: sepsis, hyponatremia and hyperkalemia with IV fluids ordered, AKI with concern for hepatorenal syndrome, cirrhosis with ascites. GI is following. Patient being transferred to SDU and plan for Nephrology consult d/t worsening renal status. Notes state worsening jaundice. Possible transfer to [facility] should transplant be needed/if worsening continues.    Medications reviewed; 65 g albumin x1 dose 6/7, sliding scale novolog, 5 units lantus/evening, 10 mg oral vitamin K x1 dose 6/7, 30 g kayexalate x1 dose 6/7. Labs reviewed; CBG: 156 mg/dl today, Na: 123 mmol/l, K: 6.4 mmol/l, Cl: 89 mmol/l, BUN: 52 mg/dl, creatinine: 3.31 mg/dl, Ca: 8.2 mg/dl, LFTs elevated, GFR: 19 ml/min.  IVF; D5-150 mEq sodium bicarb @ 75 ml/hr (306 kcal).     NUTRITION - FOCUSED PHYSICAL EXAM:  unable to complete at this time.   Diet Order:   Diet Order            Diet Carb Modified Fluid consistency: Thin; Room service appropriate? Yes  Diet effective now              EDUCATION NEEDS:   Not appropriate for education at this time  Skin:  Skin Assessment: Reviewed RN Assessment  Last BM:  6/7  Height:   Ht Readings from Last 1 Encounters:  12/23/18 5\' 5"  (1.651 m)    Weight:   Wt Readings from Last 1 Encounters:  12/23/18 63.5 kg    Ideal Body Weight:  61.8 kg  BMI:  There is no height or weight on file to calculate BMI.  Estimated Nutritional Needs:   Kcal:  1779-3903 kcal  Protein:  110-125 grams  Fluid:  >/= 1.5 L/day     Jarome Matin, MS, RD, LDN, [facility] Inpatient Clinical Dietitian Pager # [phone] After hours/weekend pager # [phone]

## 2018-12-27 NOTE — Consult Note (Signed)
NAME:  Michael Hamilton, MRN:  [id number], DOB:  1961-06-23, LOS: 1 ADMISSION DATE:  01/17/2019, CONSULTATION DATE:  12/27/2018 REFERRING MD:  Dr. Earnest Conroy, [facility], CHIEF COMPLAINT:  Need Consult   Brief History   58 yo male former smoker presented with abdominal pain.  He has hx of autoimmune hepatitis on prednisone with resulting cirrhosis and ascites.  Started on tx for SBP.  Developed acute renal failure.  Interview conduct with his daughter translating [place].  Past Medical History  Autoimmune hepatitis, Chronic hepatitis B, cirrhosis with varices and ascites, DM, HTN  Significant Hospital Events   6/05 Admit, parecentesis 6/06 contact [facility] Hepatologist for transfer 6/07 Paracentesis by IR, start albumin/midodrine/octreotide  Consults:  GI 6/05 liver failure Renal 6/07 AKI  Procedures:  Paracentesis 6/05 >> glucose 174, protein < 3, WBC 21,725 (89% N)  Significant Diagnostic Tests:  CT abd/pelvis 6/05 >> cirrhosis with splenomegaly and ascites Abd u/s >> GB sludge, cirrhosis and ascites  Micro Data:  COVID 6/05 >> negative Peritoneal fluid 6/05 >>  Blood 6/05 >>  Peritoneal fluid 6/07 >>   Antimicrobials:  Rocephin 6/05 >> Flagyl 6/06 >>  Meropenem 6/07 >>   Interim history/subjective:    Objective   Blood pressure (!) 99/53, pulse 84, temperature 98.4 F (36.9 C), temperature source Oral, resp. rate 18, weight 65.6 kg, SpO2 91 %.        Intake/Output Summary (Last 24 hours) at 12/27/2018 1556 Last data filed at 12/27/2018 1355 Gross per 24 hour  Intake 300.11 ml  Output 125 ml  Net 175.11 ml   Filed Weights   12/27/18 1155  Weight: 65.6 kg    Examination:  General - alert Eyes - pupils reactive, jaundice ENT - no sinus tenderness, no stridor Cardiac - regular rate/rhythm, no murmur Chest - equal breath sounds b/l, no wheezing or rales Abdomen - soft, mild distention, mild tenderness, no rebound/guarding Extremities - decreased  muscle bulk Skin - no rashes Neuro - follows commands, moves extremities   Resolved Hospital Problem list     Assessment & Plan:   Spontaneous bacterial peritonitis. Plan - continue ABx  Decompensated liver cirrhosis with hx of autoimmune hepatitis and chronic hepatitis B. Plan - GI d/w hepatologist in [place] about possible transfer - hold prednisone while treating infection - continue entecavir  AKI >> uncertain if hepatorenal syndrome. Hyponatremia, hyperkalemia. Plan - renal consulted - continue albumin, octreotide, midodrine - f/u BMET, monitor urine outpt  DM type II. Plan - SSI  Anemia of critical illness and chronic disease. Coagulopathy in setting of liver failure. Plan - f/u CBC, INR - transfuse for Hb < 7  Best practice:  Diet: Carb modified DVT prophylaxis: SCDs GI prophylaxis: Not indicated Mobility: Bed rest Code Status: Full code Disposition: ICU  Labs   CBC: Recent Labs  Lab 12/21/18 1042 01/10/2019 0643 12/26/18 0718 12/27/18 0602  WBC 11.2* 26.9* 37.8* 31.2*  NEUTROABS 9.4* 23.0*  --   --   HGB 10.2* 9.7* 9.7* 10.1*  HCT 31.2* 28.8* 30.0* 31.7*  MCV 78.0 76.2* 77.7* 77.3*  PLT 133.0* 75* 127* 139*    Basic Metabolic Panel: Recent Labs  Lab 01/04/2019 0643 01/15/2019 1539 12/26/18 0718 12/26/18 1507 12/27/18 0602  NA 122* 126* 127* 124* 123*  K 4.5 4.5 5.9* 5.7* 6.4*  CL 90* 96* 92* 89* 89*  CO2 22 23 23 22  21*  GLUCOSE 287* 140* 56* 190* 163*  BUN 24* 22* 36* 42* 52*  CREATININE 1.24  0.92 2.00* 2.34* 3.31*  CALCIUM 8.0* 8.2* 8.9 8.4* 8.2*   GFR: Estimated Creatinine Clearance: 20.9 mL/min (A) (by C-G formula based on SCr of 3.31 mg/dL (H)). Recent Labs  Lab 12/21/18 1042 01/04/2019 0643 12/27/2018 0644 01/16/2019 1003 12/26/18 0718 12/27/18 0602  WBC 11.2* 26.9*  --   --  37.8* 31.2*  LATICACIDVEN  --   --  6.4* 5.1*  --   --     Liver Function Tests: Recent Labs  Lab 01/14/2019 0643 12/23/2018 1539 12/27/18 0602   AST 27  --  395*  ALT 29  --  425*  ALKPHOS 53  --  102  BILITOT 9.5* 8.4* 22.5*  PROT 7.1  --  6.3*  ALBUMIN 2.1*  --  2.9*   Recent Labs  Lab 12/24/2018 0643  LIPASE 38   No results for input(s): AMMONIA in the last 168 hours.  ABG    Component Value Date/Time   TCO2 28 11/21/2011 1211     Coagulation Profile: Recent Labs  Lab 01/05/2019 0643 12/27/18 0602  INR 2.0* 4.1*    Cardiac Enzymes: Recent Labs  Lab 01/17/2019 0644  TROPONINI <0.03    HbA1C: Hemoglobin A1C  Date/Time Value Ref Range Status  12/23/2018 09:06 AM 7.7 (A) 4.0 - 5.6 % Final  09/15/2018 10:55 AM 4.8 4.0 - 5.6 % Final   Hgb A1c MFr Bld  Date/Time Value Ref Range Status  01/11/2019 06:43 AM 8.0 (H) 4.8 - 5.6 % Final    Comment:    (NOTE)         Prediabetes: 5.7 - 6.4         Diabetes: >6.4         Glycemic control for adults with diabetes: <7.0   09/05/2016 09:26 AM 11.9 (H) 4.6 - 6.5 % Final    Comment:    Glycemic Control Guidelines for People with Diabetes:Non Diabetic:  <6%Goal of Therapy: <7%Additional Action Suggested:  >8%     CBG: Recent Labs  Lab 12/26/18 1222 12/26/18 1629 12/26/18 2147 12/27/18 0743 12/27/18 1314  GLUCAP 191* 193* 199* 156* 171*    Review of Systems:   Has mild dyspnea, and chest pain radiating from abdomen.  Has nausea.    Remainder reviewed and negative.  Past Medical History  He,  has a past medical history of Anemia, Cirrhosis ([place]), Diabetes mellitus ([place]), Essential hypertension, Frequent headaches, and Rotator cuff arthropathy of right shoulder.   Surgical History    Past Surgical History:  Procedure Laterality Date  . IR TRANSCATHETER BX  11/16/2018  . IR US GUIDE VASC ACCESS RIGHT  11/16/2018  . IR VENOGRAM HEPATIC WO HEMODYNAMIC EVALUATION  11/16/2018  . SHOULDER ARTHROSCOPY W/ ROTATOR CUFF REPAIR Right      Social History   reports that he has quit smoking. He quit after 2.00 years of use. He has never used smokeless tobacco. He  reports that he does not drink alcohol or use drugs.   Family History   His family history is negative for Diabetes, Colon cancer, and Esophageal cancer.   Allergies No Known Allergies   Home Medications  Prior to Admission medications   Medication Sig Start Date End Date Taking? Authorizing Provider  entecavir (BARACLUDE) 0.5 MG tablet Take 1 tablet (0.5 mg total) by mouth daily. 11/18/18  Yes Armbruster, Carlota Raspberry, MD  furosemide (LASIX) 20 MG tablet Take 1 tablet (20 mg total) by mouth daily. 10/06/18  Yes Armbruster, Carlota Raspberry, MD  Insulin  Glargine (LANTUS SOLOSTAR) 100 UNIT/ML Solostar Pen Inject 15 Units into the skin daily at 10 pm. 12/23/18  Yes Isabelle Course, MD  METFORMIN HCL PO Take 1 tablet by mouth 2 (two) times a day.   Yes [provider]  morphine (MSIR) 15 MG tablet Take 1 tablet (15 mg total) by mouth every 4 (four) hours as needed for severe pain. 12/13/18  Yes Deno Etienne, DO  omeprazole (PRILOSEC) 40 MG capsule Take 1 capsule (40 mg total) by mouth 2 (two) times daily. 11/10/18  Yes Armbruster, Carlota Raspberry, MD  ondansetron (ZOFRAN ODT) 4 MG disintegrating tablet Take 1 tablet (4 mg total) by mouth every 8 (eight) hours as needed for nausea or vomiting. 12/13/18  Yes Deno Etienne, DO  polyethylene glycol [facility]) packet Take 17 g by mouth 2 (two) times daily. 08/21/18  Yes Santos-Sanchez, Merlene Morse, MD  propranolol (INDERAL) 20 MG tablet Take 1 tablet (20 mg total) by mouth 2 (two) times daily. 11/05/18  Yes Armbruster, Carlota Raspberry, MD  spironolactone (ALDACTONE) 50 MG tablet Take 1 tablet (50 mg total) by mouth daily. 10/06/18  Yes Armbruster, Carlota Raspberry, MD  glucose blood (ONETOUCH VERIO) test strip Test blood sugars as directed. Dx: E11.9 09/11/16   Dorothyann Peng, NP  Insulin Pen Needle 31G X 5 MM MISC Use 1 time daily with injections 07/31/18   [name], MD  ONE TOUCH LANCETS MISC Test blood sugars as directed. Dx: E11.9 09/11/16   Dorothyann Peng, NP  propranolol  (INDERAL) 20 MG/5ML solution Take 5 mLs (20 mg total) by mouth 2 (two) times a day. Patient not taking: Reported on 01/03/2019 11/06/18   Armbruster, Carlota Raspberry, MD  senna-docusate (SENOKOT-S) 8.6-50 MG tablet Take 2 tablets by mouth 2 (two) times daily. Patient not taking: Reported on 01/16/2019 08/21/18   [name], MD   D/w Dr. Jeani Hawking, MD [facility] Pulmonary/Critical Care 12/27/2018, 4:28 PM

## 2018-12-27 NOTE — Progress Notes (Signed)
Have alerted MD via text of critical labs values of bilirubin and K+. Will await response and/or realert MD.

## 2018-12-27 NOTE — Progress Notes (Signed)
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  Patient was seen in the ED on May 24 and diagnosed with colitis for which he was prescribed a course of ciprofloxacin and Flagyl which patient completed.  Patient follows internal medicine clinic and was seen by them on June 3, recommended to continue Aldactone and Lasix for ascites.  His temperature was 99.70F in ED with pulse 102 and labs revealed sodium 122, potassium 4.5, bicarb 22, BUN 24, creatinine 1.2, hemoglobin 9.7, platelets 70 5K and WBC of 26.9 with lactate 6.4-->5.1.  Patient admitted to hospitalist service with GI consultation and IR consultation for diagnostic/therapeutic tap for possible SBP.  Patient started on IV Rocephin upon admission.  Stool C. difficile was requested but patient did not have any further diarrhea.  Subjective:  Patient has had significant decline in liver function with worsening LFTs, total bilirubin spiking to 22 and INR up to 4.1 today.  BMP also revealed worsening hyponatremia/hyperkalemia.  Patient vomited after 1 dose of Kayexalate earlier today.  He states abdominal pain is persistent although transiently improves with pain medications.  He has been seen by GI.  Daughter bedside and assists with translation.  Objective: Vitals:   12/27/18 1212 12/27/18 1217 12/27/18 1424 12/27/18 1710  BP: 93/63 103/62 (!) 99/53   Pulse:   84   Resp:      Temp:      TempSrc:      SpO2:   91%   Weight:    65.6 kg  Height:    5\' 5"  (1.651 m)    Intake/Output Summary (Last 24 hours) at 12/27/2018 1826 Last data filed at 12/27/2018  1355 Gross per 24 hour  Intake 300.11 ml  Output 125 ml  Net 175.11 ml   Filed Weights   12/27/18 1155 12/27/18 1710  Weight: 65.6 kg 65.6 kg    Physical Examination:  General exam: Appears calm and comfortable  Respiratory system: Clear to auscultation. Respiratory effort normal. Cardiovascular system: S1 & S2 heard, RRR. No JVD, murmurs, rubs, gallops or clicks. No pedal edema. Gastrointestinal system: Abdomen is mildly distended but soft with diffuse tenderness associated with No significant rebound/guarding.  Normal bowel sounds heard. Central nervous system: Awake and oriented x3. No focal neurological deficits. Extremities: Symmetric 4 x 5 power.  No significant leg edema Skin: No rashes, lesions or ulcers Psychiatry: Judgement and insight appear normal. Mood & affect appropriate.     Data Reviewed: I have personally reviewed following labs and imaging studies  CBC: Recent Labs  Lab 12/21/18 1042 01/02/2019 0643 12/26/18 0718 12/27/18 0602  WBC 11.2* 26.9* 37.8* 31.2*  NEUTROABS 9.4* 23.0*  --   --   HGB 10.2* 9.7* 9.7* 10.1*  HCT 31.2* 28.8* 30.0* 31.7*  MCV 78.0 76.2* 77.7* 77.3*  PLT 133.0* 75* 127* 242*   Basic Metabolic Panel: Recent Labs  Lab 01/16/2019 1539 12/26/18 0718 12/26/18 1507 12/27/18 0602 12/27/18 1659  NA 126* 127* 124* 123* 124*  K 4.5 5.9* 5.7* 6.4* 5.6*  CL 96* 92* 89* 89* 86*  CO2 23 23 22  21* 24  GLUCOSE 140* 56* 190* 163* 467*  BUN 22* 36* 42*  52* 62*  CREATININE 0.92 2.00* 2.34* 3.31* 3.61*  CALCIUM 8.2* 8.9 8.4* 8.2* 7.8*   GFR: Estimated Creatinine Clearance: 19.2 mL/min (A) (by C-G formula based on SCr of 3.61 mg/dL (H)). Liver Function Tests: Recent Labs  Lab 12/23/2018 0643 01/08/2019 1539 12/27/18 0602  AST 27  --  395*  ALT 29  --  425*  ALKPHOS 53  --  102  BILITOT 9.5* 8.4* 22.5*  PROT 7.1  --  6.3*  ALBUMIN 2.1*  --  2.9*   Recent Labs  Lab 12/26/2018 0643  LIPASE 38   No results for input(s): AMMONIA in the  last 168 hours. Coagulation Profile: Recent Labs  Lab 12/21/2018 0643 12/27/18 0602  INR 2.0* 4.1*   Cardiac Enzymes: Recent Labs  Lab 01/08/2019 0644  TROPONINI <0.03   BNP (last 3 results) No results for input(s): PROBNP in the last 8760 hours. HbA1C: Recent Labs    01/08/2019 0643  HGBA1C 8.0*   CBG: Recent Labs  Lab 12/26/18 1629 12/26/18 2147 12/27/18 0743 12/27/18 1314 12/27/18 1648  GLUCAP 193* 199* 156* 171* 184*   Lipid Profile: No results for input(s): CHOL, HDL, LDLCALC, TRIG, CHOLHDL, LDLDIRECT in the last 72 hours. Thyroid Function Tests: No results for input(s): TSH, T4TOTAL, FREET4, T3FREE, THYROIDAB in the last 72 hours. Anemia Panel: No results for input(s): VITAMINB12, FOLATE, FERRITIN, TIBC, IRON, RETICCTPCT in the last 72 hours. Sepsis Labs: Recent Labs  Lab 01/08/2019 0644 01/07/2019 1003  LATICACIDVEN 6.4* 5.1*    Recent Results (from the past 322 hour(s))  Helicobacter pylori special antigen     Status: None   Collection Time: 12/24/18  2:23 PM  Result Value Ref Range Status   MICRO NUMBER: [id number]  Final   SPECIMEN QUALITY Adequate  Final   SOURCE: STOOL  Final   STATUS: FINAL  Final   RESULT:   Final    Not Detected  Antimicrobials, proton pump inhibitors, and bismuth preparations inhibit H. pylori and ingestion up to two weeks prior to testing may cause false negative results. If clinically indicated the test should be repeated on a new specimen  obtained two weeks after discontinuing treatment.   SARS Coronavirus 2 (CEPHEID - Performed in [facility] hospital lab), Hosp Order     Status: None   Collection Time: 01/01/2019  6:42 AM  Result Value Ref Range Status   SARS Coronavirus 2 NEGATIVE NEGATIVE Final    Comment: (NOTE) If result is NEGATIVE SARS-CoV-2 target nucleic acids are NOT DETECTED. The SARS-CoV-2 RNA is generally detectable in upper and lower  respiratory specimens during the acute phase of infection. The lowest    concentration of SARS-CoV-2 viral copies this assay can detect is 250  copies / mL. A negative result does not preclude SARS-CoV-2 infection  and should not be used as the sole basis for treatment or other  patient management decisions.  A negative result may occur with  improper specimen collection / handling, submission of specimen other  than nasopharyngeal swab, presence of viral mutation(s) within the  areas targeted by this assay, and inadequate number of viral copies  (<250 copies / mL). A negative result must be combined with clinical  observations, patient history, and epidemiological information. If result is POSITIVE SARS-CoV-2 target nucleic acids are DETECTED. The SARS-CoV-2 RNA is generally detectable in upper and lower  respiratory specimens dur ing the acute phase of infection.  Positive  results are indicative of active infection with SARS-CoV-2.  Clinical  correlation with patient history and other diagnostic information is  necessary to determine patient infection status.  Positive results do  not rule out bacterial infection or co-infection with other viruses. If result is PRESUMPTIVE POSTIVE SARS-CoV-2 nucleic acids MAY BE PRESENT.   A presumptive positive result was obtained on the submitted specimen  and confirmed on repeat testing.  While 2019 novel coronavirus  (SARS-CoV-2) nucleic acids may be present in the submitted sample  additional confirmatory testing may be necessary for epidemiological  and / or clinical management purposes  to differentiate between  SARS-CoV-2 and other Sarbecovirus currently known to infect humans.  If clinically indicated additional testing with an alternate test  methodology [phone]) is advised. The SARS-CoV-2 RNA is generally  detectable in upper and lower respiratory sp ecimens during the acute  phase of infection. The expected result is Negative. Fact Sheet for Patients:  StrictlyIdeas.no Fact Sheet  for Healthcare Providers: BankingDealers.co.za This test is not yet approved or cleared by the [place] FDA and has been authorized for detection and/or diagnosis of SARS-CoV-2 by FDA under an Emergency Use Authorization (EUA).  This EUA will remain in effect (meaning this test can be used) for the duration of the COVID-19 declaration under Section 564(b)(1) of the Act, 21 U.S.C. section 360bbb-3(b)(1), unless the authorization is terminated or revoked sooner. Performed at [facility], [place] [address], [place], [place] [address]   Culture, blood (routine x 2)     Status: None (Preliminary result)   Collection Time: 01/16/2019  7:40 AM  Result Value Ref Range Status   Specimen Description   Final    BLOOD LEFT HAND Performed at [facility], [place] [address], [place], [place] [address]    Special Requests   Final    BOTTLES DRAWN AEROBIC AND ANAEROBIC Blood Culture adequate volume Performed at [facility] [address], [place], [place] [address]    Culture   Final    NO GROWTH 2 DAYS Performed at [facility] [address], [place], [place] [address]    Report Status PENDING  Incomplete  Culture, blood (routine x 2)     Status: None (Preliminary result)   Collection Time: 01/01/2019  7:50 AM  Result Value Ref Range Status   Specimen Description   Final    BLOOD BLOOD LEFT WRIST Performed at [facility] [address] [place], [place] [address]    Special Requests   Final    BOTTLES DRAWN AEROBIC AND ANAEROBIC Blood Culture results may not be optimal due to an excessive volume of blood received in culture bottles Performed at [facility] [address] [place], [place] [address]    Culture   Final    NO GROWTH 2 DAYS Performed at [facility] [address] [place], [place] [address]    Report Status PENDING  Incomplete  Culture, body fluid-bottle      Status: None (Preliminary result)   Collection Time: 01/18/2019 12:44 PM  Result Value Ref Range Status   Specimen Description PERITONEAL  Final   Special Requests NONE  Final   Gram Stain   Final    GRAM NEGATIVE RODS IN BOTH AEROBIC AND ANAEROBIC BOTTLES CRITICAL RESULT CALLED TO, READ BACK BY AND VERIFIED WITH: JOHNSON AT [place] [id number] BY SJW Performed at [facility], [place] [address], [place],  [address]    Culture GRAM NEGATIVE RODS  Final  Report Status PENDING  Incomplete  Gram stain     Status: None   Collection Time: 01/13/2019 12:44 PM  Result Value Ref Range Status   Specimen Description PERITONEAL  Final   Special Requests NONE  Final   Gram Stain   Final    ABUNDANT WBC PRESENT,BOTH PMN AND MONONUCLEAR NO ORGANISMS SEEN Performed at [facility], [address], [place], [place] [address]    Report Status 12/24/2018 FINAL  Final  Urine culture     Status: Abnormal   Collection Time: 01/16/2019  1:12 PM  Result Value Ref Range Status   Specimen Description   Final    URINE, RANDOM Performed at [facility] [address], [place], [place] [address]    Special Requests   Final    NONE Performed at [facility], [place] [address], [place], [place] [address]    Culture MULTIPLE SPECIES PRESENT, SUGGEST RECOLLECTION (A)  Final   Report Status January 17, 2019 FINAL  Final  MRSA PCR Screening     Status: None   Collection Time: 2019/01/17  2:17 PM  Result Value Ref Range Status   MRSA by PCR NEGATIVE NEGATIVE Final    Comment:        The GeneXpert MRSA Assay (FDA approved for NASAL specimens only), is one component of a comprehensive MRSA colonization surveillance program. It is not intended to diagnose MRSA infection nor to guide or monitor treatment for MRSA infections. Performed at [facility], [place] [address], [place], [place] [address]       Radiology Studies: US Renal  Result Date:  2019-01-17 CLINICAL DATA:  Acute renal insufficiency. EXAM: RENAL / URINARY TRACT ULTRASOUND COMPLETE COMPARISON:  CT 01/12/2019 FINDINGS: Right Kidney: Renal measurements: 10.3 x 4.9 x 5.4 cm = volume: 143 mL. Mild increased cortical echogenicity. No mass or hydronephrosis visualized. Left Kidney: Renal measurements: 10.9 x 4.3 x 6.0 cm = volume: 146 mL. Mild increased cortical echogenicity. No mass or hydronephrosis visualized. Bladder: Appears normal for degree of bladder distention. Incidental finding of known ascites with a few septations. IMPRESSION: Normal size kidneys without hydronephrosis. Mild increased cortical echogenicity which can be seen with medical renal disease. Electronically Signed   By: Marin Olp M.D.   On: 01-17-19 16:08   US Abdomen Limited Ruq  Result Date: January 17, 2019 CLINICAL DATA:  Elevated LFTs.  History of cirrhosis. EXAM: ULTRASOUND ABDOMEN LIMITED RIGHT UPPER QUADRANT COMPARISON:  CT abdomen pelvis dated December 25, 2018. Right upper quadrant ultrasound dated August 25, 2018. FINDINGS: Gallbladder: Sludge without gallstones. Mild gallbladder wall thickening is unchanged and likely related to ascites and underlying liver disease. No sonographic Murphy sign noted by sonographer. Common bile duct: Diameter: 3 mm, normal. Liver: No focal lesion identified. Unchanged nodular contour with coarsened parenchymal echogenicity. Portal vein is patent on color Doppler imaging with normal direction of blood flow towards the liver. IMPRESSION: 1. No biliary dilatation. 2. Gallbladder sludge. 3. Unchanged cirrhosis and ascites. Electronically Signed   By: Titus Dubin M.D.   On: Jan 17, 2019 14:16        Scheduled Meds:  [START ON 12/28/2018] entecavir  0.5 mg Oral QODAY   feeding supplement  1 Container Oral BID BM   feeding supplement (PRO-STAT SUGAR FREE 64)  30 mL Oral BID   insulin aspart  0-9 Units Subcutaneous TID WC   insulin glargine  5 Units Subcutaneous Q2200    lidocaine  midodrine  7.5 mg Oral TID   pantoprazole  40 mg Oral Daily   phytonadione  10 mg Oral Once   sodium zirconium cyclosilicate  10 g Oral TID   Continuous Infusions:  meropenem (MERREM) IV     metronidazole 500 mg (12/27/18 0405)   octreotide  (SANDOSTATIN)    IV infusion 50 mcg/hr (12/27/18 1423)    sodium bicarbonate  infusion 1000 mL 75 mL/hr at 12/27/18 0857    Assessment & Plan:    1.  Acute on chronic liver failure:  LFTs worsening with total bilirubin peaking to 22 (from 8.4) and drastic rise in transaminases from less than 100 on presentation to 400s now.  INR up to 4.1.  White count peaked to 37.8K yesterday and today at 31K (although in the setting of p.o. prednisone).  Patient also hyponatremic and hyperkalemic with acute renal failure.  Patient mentating okay but will check ammonia level for baseline.  His MELD score is 38 and GI in discussions with transplant centers in the area for possible transfer.  He received vitamin K and IV albumin second dose per GI this morning.  Patient transferred to stepdown status and consulted intensivist on call for consultation and possible transfer to their service.Blood typing sent per GI. Repeat ultrasonogram abdomen today showed gallbladder sludge, cirrhosis and ascites.  2.Spontaneous bacterial peritonitis: Patient underwent paracentesis on June 5 with drainage of 2.7 L fluid suggestive of SBP.  Peritoneal fluid growing gram-negative rods.  Patient admitted with Rocephin/Flagyl, now changed to IV meropenem/Flagyl and started on entecavir.  Steroids discontinued per recommendation of GI/hepatology.  Patient also underwent repeat paracentesis today, will follow-up cultures.    3.  Acute renal failure: Seen by nephrology and started on Midodrin/octreotide for possible hepatorenal syndrome.  Please refer to their note for details.  4. ?History of chronic hepatitis B: Continue entecavir.  Per our records patient had negative  hepatitis B surface antigen with positive hepatitis B core antibody (total positive but IgM negative) and negative PCR for hepatitis B virus in dec 2019.  He also had positive hep C virus antibody but undetected on PCR.  Defer further recommendations to GI  5.  Diabetes mellitus type II: Continue sliding scale insulin and low-dose Lantus.  Watch for hypoglycemia given poor oral intake, discontinuation of steroids and worsening renal function.  6.  Hyperkalemia: Patient did not tolerate Kayexalate this morning.  Ordered back-to-back nebs, dextrose 50/insulin.  Repeat potassium level improved from 6.4-5.6.  Started on [facility] by nephrology.  7.  Hypervolemic hyponatremia: Sodium stable around 124.  Continue diuresis.  Serial labs.  8. Nausea vomiting/poor oral intake: Could be related to hyponatremia/uremia.  Patient feels constipated.  Will order lactulose as needed.  Continue PPI, Zofran as needed  DVT prophylaxis: SCDs given thrombocytopenia/significant coagulopathy Code Status: Full code Family / Patient Communication: Discussed with daughter in detail.  Discussed with GI NP as well as critical care MDs Dr. Tamala Julian and Dr. Halford Chessman. Critical care service following along closely and will have low threshold for transfer to their service if patient deteriorates further. Disposition Plan: Will need transfer to tertiary care center although his insurance status appears to be a barrier.?  Try [facility].  Appreciate GI service's relentless efforts and communicating with hepatology/liver transplant centers in the area.  Social work consult requested.     LOS: 1 day    Time spent: 50 minutes    Guilford Shi, MD [facility] Pager [phone]  If 7PM-7AM, please contact  night-coverage [email or web address] Password [facility] 12/27/2018, 6:26 PM

## 2018-12-27 NOTE — Procedures (Signed)
   [place] RLQ paracentesis  60 cc for labs only per MD  Tolerated well EBL: 0

## 2018-12-27 NOTE — Consult Note (Signed)
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  He was recently treated for colitis with a round of ciprofloxacin and metronidazole and when seen 4 days ago by his primary care provider, felt to be doing well enough to continue outpatient diuretic therapy with furosemide and spironolactone.  He had 2.7 L paracentesis that showed evidence of SBP (WBCs 21,000) on 12/24/2018 and is on ceftriaxone.  He unfortunately has developed acute kidney injury with creatinine going from 0.9 on 6/5 to 2.0 on 6/6 and today's 3.3 with hyperkalemia of 6.4.  His spironolactone was discontinued on 01/09/2019.  Earlier today he had a 60 cc diagnostic paracentesis.  He complains of abdominal distention with some orthopnea and mild dyspnea on exertion.  He just vomited after taking Kayexalate.  Past Medical History:  Diagnosis Date  . Anemia   . Cirrhosis ([place])    with ascites  . Diabetes mellitus ([place])   . Essential hypertension   . Frequent headaches   . Rotator cuff arthropathy of right shoulder     Past Surgical History:  Procedure Laterality Date  . IR TRANSCATHETER BX  11/16/2018  . IR US GUIDE VASC ACCESS RIGHT  11/16/2018  . IR VENOGRAM HEPATIC WO HEMODYNAMIC EVALUATION  11/16/2018  . SHOULDER ARTHROSCOPY W/ ROTATOR CUFF REPAIR Right     Family History  Problem Relation Age of Onset  . Diabetes Neg Hx   . Colon cancer Neg Hx   . Esophageal cancer Neg Hx     Social History:  reports that he has quit smoking. He quit after 2.00 years of use. He has never used smokeless tobacco. He reports that he does not  drink alcohol or use drugs.  Allergies: No Known Allergies  Medications:  Scheduled: . albuterol  2.5 mg Nebulization Q30 min  . [START ON 12/28/2018] entecavir  0.5 mg Oral QODAY  . feeding supplement  1 Container Oral BID BM  . feeding supplement (PRO-STAT SUGAR FREE 64)  30 mL Oral BID  . insulin aspart  0-9 Units Subcutaneous TID WC  . insulin glargine  5 Units Subcutaneous Q2200  . lidocaine      . midodrine  7.5 mg Oral TID  . pantoprazole  40 mg Oral Daily  . phytonadione  10 mg Oral Once  . sodium zirconium cyclosilicate  20 g Oral BID    BMP Latest Ref Rng & Units 12/27/2018 12/26/2018 12/26/2018  Glucose 70 - 99 mg/dL 163(H) 190(H) 56(L)  BUN 6 - 20 mg/dL 52(H) 42(H) 36(H)  Creatinine 0.61 - 1.24 mg/dL 3.31(H) 2.34(H) 2.00(H)  BUN/Creat Ratio 9 - 20 - - -  Sodium 135 - 145 mmol/L 123(L) 124(L) 127(L)  Potassium 3.5 - 5.1 mmol/L 6.4(HH) 5.7(H) 5.9(H)  Chloride 98 - 111 mmol/L 89(L) 89(L) 92(L)  CO2 22 - 32 mmol/L 21(L) 22 23  Calcium 8.9 - 10.3 mg/dL 8.2(L) 8.4(L) 8.9   CBC Latest Ref Rng & Units 12/27/2018 12/26/2018 01/19/2019  WBC 4.0 - 10.5 K/uL 31.2(H) 37.8(H) 26.9(H)  Hemoglobin 13.0 - 17.0 g/dL 10.1(L) 9.7(L) 9.7(L)  Hematocrit 39.0 - 52.0 % 31.7(L) 30.0(L) 28.8(L)  Platelets 150 - 400 K/uL 139(L) 127(L) 75(L)  US Paracentesis  Result Date: 01/04/2019 INDICATION: Patient with history of autoimmune hepatitis, cirrhosis, ascites. Request made for diagnostic and therapeutic paracentesis. EXAM: ULTRASOUND GUIDED DIAGNOSTIC AND THERAPEUTIC PARACENTESIS MEDICATIONS: None COMPLICATIONS: None immediate. PROCEDURE: Informed written consent was obtained from the patient/daughter after a discussion of the risks, benefits and alternatives to treatment. A timeout was performed prior to the initiation of the procedure. Initial ultrasound scanning demonstrates a moderate amount of ascites within the right upper to mid abdominal quadrant. The right upper to mid abdomen was prepped and  draped in the usual sterile fashion. 1% lidocaine was used for local anesthesia. Following this, a 19 gauge, 7-cm, Yueh catheter was introduced. An ultrasound image was saved for documentation purposes. The paracentesis was performed. The catheter was removed and a dressing was applied. The patient tolerated the procedure well without immediate post procedural complication. FINDINGS: A total of approximately 2.7 liters of turbid, yellow fluid was removed. Samples were sent to the laboratory as requested by the clinical team. IMPRESSION: Successful ultrasound-guided diagnostic and therapeutic paracentesis yielding 2.7 liters of peritoneal fluid. Read by: Rowe Robert, PA-C Electronically Signed   By: Sandi Mariscal M.D.   On: 01/14/2019 12:41    Review of Systems  Constitutional: Positive for chills, fever, malaise/fatigue and weight loss.  HENT: Negative.   Eyes: Negative.   Respiratory: Positive for shortness of breath. Negative for cough and hemoptysis.        Exertional dyspnea  Cardiovascular: Positive for orthopnea and leg swelling. Negative for chest pain and palpitations.  Gastrointestinal: Positive for abdominal pain, nausea and vomiting.       Following Kayexalate this morning  Genitourinary: Negative.   Musculoskeletal: Positive for back pain. Negative for myalgias and neck pain.  Skin:       Icterus   Blood pressure 103/62, pulse 84, temperature 98.4 F (36.9 C), temperature source Oral, resp. rate 18, SpO2 93 %. Physical Exam  Nursing note and vitals reviewed. Constitutional: He is oriented to person, place, and time. He appears well-developed and well-nourished.  Appears to be in discomfort  HENT:  Head: Normocephalic and atraumatic.  Mouth/Throat: Oropharynx is clear and moist. No oropharyngeal exudate.  Eyes: Conjunctivae are normal. Scleral icterus is present.  Neck: Normal range of motion. Neck supple. JVD present.  Cardiovascular: Normal rate and regular rhythm.  Murmur  heard. 2/6 ejection systolic murmur  Respiratory: He has no wheezes. He has no rales.  Poor inspiratory effort with decreased breath sounds over bases, no rales or rhonchi  GI: Soft. Bowel sounds are normal. He exhibits distension. There is abdominal tenderness.  Moderate global distention with tenderness over lower quadrants  Musculoskeletal:        General: Edema present.     Comments: 1-2+ lower extremity edema  Neurological: He is alert and oriented to person, place, and time.  Skin: Skin is warm and dry. He is not diaphoretic.  Global icterus    Assessment/Plan: 1.  Acute kidney injury: Suspect to be hemodynamically mediated in the setting of recent SBP but cannot entirely rule out hepatorenal syndrome type I (which by strict criteria would be excluded in the presence of SBP).  I will check urine electrolytes and renal ultrasound.  He needs strict input/output and I agree with a trial of intravenous albumin with midodrine and octreotide. 2.  Hyperkalemia: In part from recent use of spironolactone (pharmacological effect will last about 4 to 5 days after discontinuation of this medication).  Medical management is currently being undertaken  using Lokelma, avoid repeating Kayexalate.  Limit dietary potassium intake.  Avoid lactated Ringer's. 3.  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  Attempting to transfer to [facility] for liver transplantation. 4.  Anemia of chronic disease: Check iron studies and decide on need for replacement versus ESA. 5.  Hyponatremia: Hypervolemic hyponatremia in the setting of cirrhosis, monitor with albumin/midodrine.  Utilize furosemide if develops volume excess that compromises respiratory status.  Sowmya Partridge K. 12/27/2018, 12:20 PM

## 2018-12-27 NOTE — Progress Notes (Signed)
Pt will be transferred to stepdown unit.

## 2018-12-27 NOTE — Progress Notes (Signed)
GI UPDATE:  I spoke with Dr. Earlean Shawl from [facility] again, the patient has medicaid "family planning" insurance which covers basic preventative services and due to this, while [facility] wants to accept him for transplant they cannot accept him due to his limited insurance. I called [facility] and spoke with Dr. Scarlette Calico. We discussed his case, he also feels the patient should be transferred for transplant services however he does not think any center is allowed to accept the patient for transfer with his type of insurance. The patient needs to apply for emergency medicaid insurance and obtain this to be considered a candidate for transplant. We will obtain a urgent social work consult to work on this however it can take up to a few weeks to get approved. 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 We have also consulted infectious diseases about antibiotic regimen. Of note, RUQ US shows no biliary obstruction. Repeat paracentesis done and fluid analysis pending.  Plan as this time: - urgent social work consult to apply for emergency medicaid coverage - continue antibiotics. ID to see tomorrow, but will stop rocephin, start meropenem, continue flagyl. Await ID and sensitivity from prior culture - treating with albumin, midocrine, octreotide for HRS. If renal function worsens despite this can use levophed protocol as outlined: 0.5mg  / hr with goal of increasing MAP by 74mm Hg or increasing 4 hour urine output by 200cc. If no response can increase levophed every 4 hours by 0.5mg  / hr to max of 3mg  / hr - vitamin K ordered, trend INR and close monitoring of mental status - blood typing done in case patient becomes transplant candidate - recommended holding prednisone due to severe infection right now, on entecavir, hep B DNA pending but can check hep B DNA  Spoke with patient's daughter at length and  discussed course to date, high risk for mortality due to severe liver disease and this infection, without transplant.   Dr. Rush Landmark is assuming GI care tomorrow, I will update him on this case.  Hoback Cellar, MD [facility]

## 2018-12-27 NOTE — Progress Notes (Signed)
INR 4.1 MD present and aware.

## 2018-12-28 ENCOUNTER — Inpatient Hospital Stay (HOSPITAL_COMMUNITY): Payer: Medicaid Other

## 2018-12-28 DIAGNOSIS — Z87891 Personal history of nicotine dependence: Secondary | ICD-10-CM

## 2018-12-28 DIAGNOSIS — D696 Thrombocytopenia, unspecified: Secondary | ICD-10-CM | POA: Diagnosis present

## 2018-12-28 DIAGNOSIS — I8500 Esophageal varices without bleeding: Secondary | ICD-10-CM | POA: Diagnosis present

## 2018-12-28 DIAGNOSIS — R945 Abnormal results of liver function studies: Secondary | ICD-10-CM

## 2018-12-28 DIAGNOSIS — R9431 Abnormal electrocardiogram [ECG] [EKG]: Secondary | ICD-10-CM

## 2018-12-28 DIAGNOSIS — R739 Hyperglycemia, unspecified: Secondary | ICD-10-CM

## 2018-12-28 DIAGNOSIS — R768 Other specified abnormal immunological findings in serum: Secondary | ICD-10-CM

## 2018-12-28 DIAGNOSIS — K709 Alcoholic liver disease, unspecified: Secondary | ICD-10-CM

## 2018-12-28 DIAGNOSIS — R1084 Generalized abdominal pain: Secondary | ICD-10-CM

## 2018-12-28 DIAGNOSIS — K7290 Hepatic failure, unspecified without coma: Secondary | ICD-10-CM

## 2018-12-28 DIAGNOSIS — I8510 Secondary esophageal varices without bleeding: Secondary | ICD-10-CM

## 2018-12-28 DIAGNOSIS — N179 Acute kidney failure, unspecified: Secondary | ICD-10-CM | POA: Diagnosis present

## 2018-12-28 HISTORY — PX: IR FLUORO GUIDE CV LINE RIGHT: IMG2283

## 2018-12-28 HISTORY — PX: IR US GUIDE VASC ACCESS RIGHT: IMG2390

## 2018-12-28 LAB — BASIC METABOLIC PANEL
Anion gap: 13 (ref 5–15)
BUN: 70 mg/dL — ABNORMAL HIGH (ref 6–20)
CO2: 24 mmol/L (ref 22–32)
Calcium: 7.8 mg/dL — ABNORMAL LOW (ref 8.9–10.3)
Chloride: 86 mmol/L — ABNORMAL LOW (ref 98–111)
Creatinine, Ser: 3.89 mg/dL — ABNORMAL HIGH (ref 0.61–1.24)
GFR calc Af Amer: 18 mL/min — ABNORMAL LOW (ref >60)
GFR calc non Af Amer: 16 mL/min — ABNORMAL LOW (ref >60)
Glucose, Bld: 383 mg/dL — ABNORMAL HIGH (ref 70–99)
Potassium: 5.9 mmol/L — ABNORMAL HIGH (ref 3.5–5.1)
Sodium: 123 mmol/L — ABNORMAL LOW (ref 135–145)

## 2018-12-28 LAB — PSA: Prostatic Specific Antigen: 1.95 ng/mL (ref 0.00–4.00)

## 2018-12-28 LAB — GRAM STAIN

## 2018-12-28 LAB — HEPATIC FUNCTION PANEL
ALT: 266 U/L — ABNORMAL HIGH (ref 0–44)
AST: 212 U/L — ABNORMAL HIGH (ref 15–41)
Albumin: 3.1 g/dL — ABNORMAL LOW (ref 3.5–5.0)
Alkaline Phosphatase: 96 U/L (ref 38–126)
Bilirubin, Direct: 16.6 mg/dL — ABNORMAL HIGH (ref 0.0–0.2)
Indirect Bilirubin: 6.3 mg/dL — ABNORMAL HIGH (ref 0.3–0.9)
Total Bilirubin: 22.9 mg/dL (ref 0.3–1.2)
Total Protein: 5.9 g/dL — ABNORMAL LOW (ref 6.5–8.1)

## 2018-12-28 LAB — RENAL FUNCTION PANEL
Albumin: 3.1 g/dL — ABNORMAL LOW (ref 3.5–5.0)
Anion gap: 13 (ref 5–15)
BUN: 67 mg/dL — ABNORMAL HIGH (ref 6–20)
CO2: 21 mmol/L — ABNORMAL LOW (ref 22–32)
Calcium: 7.6 mg/dL — ABNORMAL LOW (ref 8.9–10.3)
Chloride: 87 mmol/L — ABNORMAL LOW (ref 98–111)
Creatinine, Ser: 3.82 mg/dL — ABNORMAL HIGH (ref 0.61–1.24)
GFR calc Af Amer: 19 mL/min — ABNORMAL LOW (ref >60)
GFR calc non Af Amer: 16 mL/min — ABNORMAL LOW (ref >60)
Glucose, Bld: 365 mg/dL — ABNORMAL HIGH (ref 70–99)
Phosphorus: 7 mg/dL — ABNORMAL HIGH (ref 2.5–4.6)
Potassium: 5.9 mmol/L — ABNORMAL HIGH (ref 3.5–5.1)
Sodium: 121 mmol/L — ABNORMAL LOW (ref 135–145)

## 2018-12-28 LAB — PROTIME-INR
INR: 4.8 (ref 0.8–1.2)
Prothrombin Time: 44.3 seconds — ABNORMAL HIGH (ref 11.4–15.2)

## 2018-12-28 LAB — IRON AND TIBC: Iron: 51 ug/dL (ref 45–182)

## 2018-12-28 LAB — GLUCOSE, CAPILLARY
Glucose-Capillary: 385 mg/dL — ABNORMAL HIGH (ref 70–99)
Glucose-Capillary: 355 mg/dL — ABNORMAL HIGH (ref 70–99)
Glucose-Capillary: 241 mg/dL — ABNORMAL HIGH (ref 70–99)
Glucose-Capillary: 209 mg/dL — ABNORMAL HIGH (ref 70–99)

## 2018-12-28 LAB — ABO/RH: ABO/RH(D): A POS

## 2018-12-28 LAB — FERRITIN: Ferritin: 907 ng/mL — ABNORMAL HIGH (ref 24–336)

## 2018-12-28 LAB — VITAMIN B12: Vitamin B-12: 2203 pg/mL — ABNORMAL HIGH (ref 180–914)

## 2018-12-28 MED ORDER — VITAMIN K1 10 MG/ML IJ SOLN
5.0000 mg | Freq: Once | INTRAVENOUS | Status: AC
  Administered 2018-12-28: 5 mg via INTRAVENOUS
  Filled 2018-12-28: qty 0.5

## 2018-12-28 MED ORDER — MIDODRINE HCL 5 MG PO TABS
10.0000 mg | ORAL_TABLET | Freq: Three times a day (TID) | ORAL | Status: DC
  Administered 2018-12-28 – 2018-12-29 (×3): 10 mg via ORAL
  Filled 2018-12-28 (×3): qty 2

## 2018-12-28 MED ORDER — SODIUM CHLORIDE 0.9 % IV SOLN
1.0000 g | Freq: Two times a day (BID) | INTRAVENOUS | Status: DC
  Administered 2018-12-28 – 2018-12-30 (×4): 1 g via INTRAVENOUS
  Filled 2018-12-28 (×7): qty 1

## 2018-12-28 MED ORDER — VITAMIN K1 10 MG/ML IJ SOLN
10.0000 mg | Freq: Once | INTRAVENOUS | Status: AC
  Administered 2018-12-29: 10 mg via INTRAVENOUS
  Filled 2018-12-28: qty 1

## 2018-12-28 MED ORDER — SODIUM CHLORIDE 0.9% FLUSH
10.0000 mL | Freq: Two times a day (BID) | INTRAVENOUS | Status: DC
  Administered 2018-12-28 – 2018-12-29 (×2): 10 mL

## 2018-12-28 MED ORDER — INSULIN GLARGINE 100 UNIT/ML ~~LOC~~ SOLN
10.0000 [IU] | Freq: Every day | SUBCUTANEOUS | Status: DC
  Administered 2018-12-28: 10 [IU] via SUBCUTANEOUS
  Filled 2018-12-28 (×3): qty 0.1

## 2018-12-28 MED ORDER — LIDOCAINE HCL 1 % IJ SOLN
INTRAMUSCULAR | Status: AC
  Filled 2018-12-28: qty 20

## 2018-12-28 MED ORDER — LIDOCAINE HCL 1 % IJ SOLN
INTRAMUSCULAR | Status: AC | PRN
  Administered 2018-12-28: 5 mL

## 2018-12-28 MED ORDER — HEPARIN SODIUM (PORCINE) 1000 UNIT/ML IJ SOLN
INTRAMUSCULAR | Status: AC | PRN
  Administered 2018-12-28: 2.4 mL

## 2018-12-28 MED ORDER — HEPARIN SODIUM (PORCINE) 1000 UNIT/ML IJ SOLN
INTRAMUSCULAR | Status: AC
  Filled 2018-12-28: qty 1

## 2018-12-28 MED ORDER — SODIUM CHLORIDE 0.9% FLUSH: 10.0000 mL | INTRAVENOUS | Status: DC | PRN

## 2018-12-28 MED ORDER — ENTECAVIR 0.5 MG PO TABS
0.5000 mg | ORAL_TABLET | ORAL | Status: DC
  Administered 2018-12-31: 0.5 mg via ORAL
  Filled 2018-12-28: qty 1

## 2018-12-28 MED ORDER — CHLORHEXIDINE GLUCONATE CLOTH 2 % EX PADS
6.0000 | MEDICATED_PAD | Freq: Every day | CUTANEOUS | Status: DC
  Administered 2018-12-28 – 2018-12-29 (×2): 6 via TOPICAL

## 2018-12-28 MED ORDER — CHLORHEXIDINE GLUCONATE CLOTH 2 % EX PADS: 6.0000 | MEDICATED_PAD | Freq: Every day | CUTANEOUS | Status: DC

## 2018-12-28 MED ORDER — INSULIN ASPART 100 UNIT/ML ~~LOC~~ SOLN
4.0000 [IU] | Freq: Three times a day (TID) | SUBCUTANEOUS | Status: DC
  Administered 2018-12-28 (×3): 4 [IU] via SUBCUTANEOUS

## 2018-12-28 NOTE — Progress Notes (Signed)
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  Patient was seen in the ED on May 24 and diagnosed with colitis for which he was prescribed a course of ciprofloxacin and Flagyl which patient completed.  Patient follows internal medicine clinic and was seen by them on June 3, recommended to continue Aldactone and Lasix for ascites.  His temperature was 99.53F in ED with pulse 102 and labs revealed sodium 122, potassium 4.5, bicarb 22, BUN 24, creatinine 1.2, hemoglobin 9.7, platelets 70 5K and WBC of 26.9 with lactate 6.4-->5.1.  Patient admitted to hospitalist service with GI consultation and IR consultation for diagnostic/therapeutic tap for possible SBP.  Patient started on IV Rocephin upon admission.  Stool C. difficile was requested but patient did not have any further diarrhea and this has been cancelled. Since admission, patient has had significant decline in liver function with worsening LFTs, total bilirubin spiking to 22 and INR up to 4.1 today.  BMP also revealed worsening hyponatremia/hyperkalemia. Patient evaluated by GI/nephrology and efforts ongoing to transfer him to a transplant center.  Subjective:   Abdomen more distended today but overall feels better. Slept well. Transaminases somewhat improved but still has significant bilirubinemia on labs. Daughter bedside and assists with translation.  Objective: Vitals:   12/28/18 0400 12/28/18 0600 12/28/18 0800 12/28/18 1135  BP: 106/67 110/65    Pulse:      Resp: 15 16    Temp:   98.2 F (36.8 C) 97.7 F (36.5 C)   TempSrc:   Oral Oral  SpO2:      Weight:      Height:        Intake/Output Summary (Last 24 hours) at 12/28/2018 1138 Last data filed at 12/28/2018 0609 Gross per 24 hour  Intake 1580.96 ml  Output 225 ml  Net 1355.96 ml   Filed Weights   12/27/18 1155 12/27/18 1710  Weight: 65.6 kg 65.6 kg    Physical Examination:  General exam: Appears calm and comfortable  Respiratory system: Clear to auscultation. Respiratory effort normal. Cardiovascular system: S1 & S2 heard, RRR. No JVD, murmurs, rubs, gallops or clicks. No pedal edema. Gastrointestinal system: Abdomen is moderately distended with ascitis. No significant rebound/guarding.  Normal bowel sounds heard. Central nervous system: Awake, more alert today and oriented x3. No focal neurological deficits. Extremities: Symmetric 4 x 5 power.  No significant leg edema Skin: No rashes, lesions or ulcers Psychiatry: Judgement and insight appear normal. Mood & affect appropriate.     Data Reviewed: I have personally reviewed following labs and imaging studies  CBC: Recent Labs  Lab 01/16/2019 0643 12/26/18 0718 12/27/18 0602  WBC 26.9* 37.8* 31.2*  NEUTROABS 23.0*  --   --   HGB 9.7* 9.7* 10.1*  HCT 28.8* 30.0* 31.7*  MCV 76.2* 77.7* 77.3*  PLT 75* 127* 976*   Basic Metabolic Panel: Recent Labs  Lab 12/26/18 1507 12/27/18 0602 12/27/18 1659 12/28/18 0250 12/28/18 0911  NA 124* 123* 124* 121* 123*  K 5.7* 6.4* 5.6* 5.9* 5.9*  CL 89* 89* 86* 87* 86*  CO2 22 21* 24 21* 24  GLUCOSE 190* 163* 467* 365*  383*  BUN 42* 52* 62* 67* 70*  CREATININE 2.34* 3.31* 3.61* 3.82* 3.89*  CALCIUM 8.4* 8.2* 7.8* 7.6* 7.8*  PHOS  --   --   --  7.0*  --    GFR: Estimated Creatinine Clearance: 17.8 mL/min (A) (by C-G formula based on SCr of 3.89 mg/dL (H)). Liver Function Tests: Recent Labs  Lab 12/30/2018 0643 12/21/2018 1539 12/27/18 0602 12/28/18 0250  AST 27  --  395* 212*  ALT 29  --  425* 266*  ALKPHOS 53  --  102 96  BILITOT  9.5* 8.4* 22.5* 22.9*  PROT 7.1  --  6.3* 5.9*  ALBUMIN 2.1*  --  2.9* 3.1*  3.1*   Recent Labs  Lab 12/29/2018 0643  LIPASE 38   Recent Labs  Lab 12/27/18 1946  AMMONIA 71*   Coagulation Profile: Recent Labs  Lab 01/18/2019 0643 12/27/18 0602  INR 2.0* 4.1*   Cardiac Enzymes: Recent Labs  Lab 01/11/2019 0644  TROPONINI <0.03   BNP (last 3 results) No results for input(s): PROBNP in the last 8760 hours. HbA1C: No results for input(s): HGBA1C in the last 72 hours. CBG: Recent Labs  Lab 12/27/18 1314 12/27/18 1648 12/27/18 2122 12/28/18 0754 12/28/18 1057  GLUCAP 171* 184* 243* 385* 355*   Lipid Profile: No results for input(s): CHOL, HDL, LDLCALC, TRIG, CHOLHDL, LDLDIRECT in the last 72 hours. Thyroid Function Tests: No results for input(s): TSH, T4TOTAL, FREET4, T3FREE, THYROIDAB in the last 72 hours. Anemia Panel: No results for input(s): VITAMINB12, FOLATE, FERRITIN, TIBC, IRON, RETICCTPCT in the last 72 hours. Sepsis Labs: Recent Labs  Lab 12/26/2018 0644 12/21/2018 1003  LATICACIDVEN 6.4* 5.1*    Recent Results (from the past 962 hour(s))  Helicobacter pylori special antigen     Status: None   Collection Time: 12/24/18  2:23 PM  Result Value Ref Range Status   MICRO NUMBER: [id number]  Final   SPECIMEN QUALITY Adequate  Final   SOURCE: STOOL  Final   STATUS: FINAL  Final   RESULT:   Final    Not Detected  Antimicrobials, proton pump inhibitors, and bismuth preparations inhibit H. pylori and ingestion up to two weeks prior to testing may cause false negative results. If clinically indicated the test should be repeated on a new specimen  obtained two weeks after discontinuing treatment.   SARS Coronavirus 2 (CEPHEID - Performed in [place] hospital lab), Hosp Order     Status: None   Collection Time: 12/26/2018  6:42 AM  Result Value Ref Range Status   SARS Coronavirus 2 NEGATIVE NEGATIVE Final    Comment: (NOTE) If result is NEGATIVE SARS-CoV-2 target  nucleic acids are NOT DETECTED. The SARS-CoV-2 RNA is generally detectable in upper and lower  respiratory specimens during the acute phase of infection. The lowest  concentration of SARS-CoV-2 viral copies this assay can detect is 250  copies / mL. A negative result does not preclude SARS-CoV-2 infection  and should not be used as the sole basis for treatment or other  patient management decisions.  A negative result may occur with  improper specimen collection / handling, submission of specimen other  than nasopharyngeal swab, presence of viral mutation(s) within the  areas targeted by this assay, and inadequate number of viral copies  (<250 copies / mL). A negative result must be combined with clinical  observations, patient history, and epidemiological information. If result is POSITIVE SARS-CoV-2 target nucleic acids are DETECTED. The SARS-CoV-2 RNA is generally  detectable in upper and lower  respiratory specimens dur ing the acute phase of infection.  Positive  results are indicative of active infection with SARS-CoV-2.  Clinical  correlation with patient history and other diagnostic information is  necessary to determine patient infection status.  Positive results do  not rule out bacterial infection or co-infection with other viruses. If result is PRESUMPTIVE POSTIVE SARS-CoV-2 nucleic acids MAY BE PRESENT.   A presumptive positive result was obtained on the submitted specimen  and confirmed on repeat testing.  While 2019 novel coronavirus  (SARS-CoV-2) nucleic acids may be present in the submitted sample  additional confirmatory testing may be necessary for epidemiological  and / or clinical management purposes  to differentiate between  SARS-CoV-2 and other Sarbecovirus currently known to infect humans.  If clinically indicated additional testing with an alternate test  methodology [phone]) is advised. The SARS-CoV-2 RNA is generally  detectable in upper and lower  respiratory sp ecimens during the acute  phase of infection. The expected result is Negative. Fact Sheet for Patients:  StrictlyIdeas.no Fact Sheet for Healthcare Providers: BankingDealers.co.za This test is not yet approved or cleared by the [place] FDA and has been authorized for detection and/or diagnosis of SARS-CoV-2 by FDA under an Emergency Use Authorization (EUA).  This EUA will remain in effect (meaning this test can be used) for the duration of the COVID-19 declaration under Section 564(b)(1) of the Act, 21 U.S.C. section 360bbb-3(b)(1), unless the authorization is terminated or revoked sooner. Performed at [facility], [place] [address], [place], [place] [address]   Culture, blood (routine x 2)     Status: None (Preliminary result)   Collection Time: 01/10/2019  7:40 AM  Result Value Ref Range Status   Specimen Description   Final    BLOOD LEFT HAND Performed at [facility], [place] [address], [place], [place] [address]    Special Requests   Final    BOTTLES DRAWN AEROBIC AND ANAEROBIC Blood Culture adequate volume Performed at [place] [address] [place], [place] [address]    Culture   Final    NO GROWTH 3 DAYS Performed at [facility], [place] [address], [place], [place] [address]    Report Status PENDING  Incomplete  Culture, blood (routine x 2)     Status: None (Preliminary result)   Collection Time: 01/03/2019  7:50 AM  Result Value Ref Range Status   Specimen Description   Final    BLOOD BLOOD LEFT WRIST Performed at [place] [address], [place], [place] [address]    Special Requests   Final    BOTTLES DRAWN AEROBIC AND ANAEROBIC Blood Culture results may not be optimal due to an excessive volume of blood received in culture bottles Performed at [place] [address] [place], [place] [address]     Culture   Final    NO GROWTH 3 DAYS Performed at [facility], [place] [address], [place],  [address]    Report Status PENDING  Incomplete  Culture, body fluid-bottle     Status: None (Preliminary result)   Collection Time: 01/19/2019 12:44 PM  Result Value Ref Range Status   Specimen Description PERITONEAL  Final   Special Requests NONE  Final   Gram Stain   Final    GRAM NEGATIVE RODS IN BOTH AEROBIC AND ANAEROBIC BOTTLES CRITICAL RESULT CALLED TO, READ BACK BY AND VERIFIED WITH:  JOHNSON AT 0710 ON [id number] BY SJW    Culture   Final    GRAM NEGATIVE RODS IDENTIFICATION AND SUSCEPTIBILITIES TO FOLLOW CULTURE REINCUBATED FOR BETTER GROWTH Performed at [facility], [place] [address] [place], [place] [address]    Report Status PENDING  Incomplete  Gram stain     Status: None   Collection Time: 01/03/2019 12:44 PM  Result Value Ref Range Status   Specimen Description PERITONEAL  Final   Special Requests NONE  Final   Gram Stain   Final    ABUNDANT WBC PRESENT,BOTH PMN AND MONONUCLEAR NO ORGANISMS SEEN Performed at [facility], [address], [place], [place] [address]    Report Status 01/17/2019 FINAL  Final  Urine culture     Status: Abnormal   Collection Time: 01/10/2019  1:12 PM  Result Value Ref Range Status   Specimen Description   Final    URINE, RANDOM Performed at [facility] [address] [place], [place] [address]    Special Requests   Final    NONE Performed at [facility], [place] [address], [place], [place] [address]    Culture MULTIPLE SPECIES PRESENT, SUGGEST RECOLLECTION (A)  Final   Report Status 12/27/2018 FINAL  Final  Culture, body fluid-bottle     Status: None (Preliminary result)   Collection Time: 12/27/18 12:25 PM  Result Value Ref Range Status   Specimen Description PERITONEAL FLUID  Final   Special Requests BOTTLES DRAWN AEROBIC AND ANAEROBIC  Final   Culture   Final    NO GROWTH < 24  HOURS Performed at [facility], [place] [address] [place], [place] [address]    Report Status PENDING  Incomplete  Gram stain     Status: None   Collection Time: 12/27/18 12:25 PM  Result Value Ref Range Status   Specimen Description PERITONEAL FLUID  Final   Special Requests NONE  Final   Gram Stain   Final    WBC PRESENT, PREDOMINANTLY PMN NO ORGANISMS SEEN CYTOSPIN SMEAR Performed at [facility], [place] [address] [place], [place] [address]    Report Status 12/28/2018 FINAL  Final  MRSA PCR Screening     Status: None   Collection Time: 12/27/18  2:17 PM  Result Value Ref Range Status   MRSA by PCR NEGATIVE NEGATIVE Final    Comment:        The GeneXpert MRSA Assay (FDA approved for NASAL specimens only), is one component of a comprehensive MRSA colonization surveillance program. It is not intended to diagnose MRSA infection nor to guide or monitor treatment for MRSA infections. Performed at [facility], [place] [address], [place], [place] [address]       Radiology Studies: US Renal  Result Date: 12/27/2018 CLINICAL DATA:  Acute renal insufficiency. EXAM: RENAL / URINARY TRACT ULTRASOUND COMPLETE COMPARISON:  CT 01/18/2019 FINDINGS: Right Kidney: Renal measurements: 10.3 x 4.9 x 5.4 cm = volume: 143 mL. Mild increased cortical echogenicity. No mass or hydronephrosis visualized. Left Kidney: Renal measurements: 10.9 x 4.3 x 6.0 cm = volume: 146 mL. Mild increased cortical echogenicity. No mass or hydronephrosis visualized. Bladder: Appears normal for degree of bladder distention. Incidental finding of known ascites with a few septations. IMPRESSION: Normal size kidneys without hydronephrosis. Mild increased cortical echogenicity which can be seen with medical renal disease. Electronically Signed   By: Marin Olp M.D.   On: 12/27/2018 16:08  US Paracentesis  Result Date: 12/28/2018 INDICATION: SBP; Ascites EXAM: ULTRASOUND-GUIDED PARACENTESIS  COMPARISON:  Previous paracentesis. MEDICATIONS: 10 cc 1% lidocaine COMPLICATIONS: None immediate. TECHNIQUE: Informed written consent was obtained from the patient after a discussion of the risks, benefits and alternatives to treatment. A timeout was performed prior to the initiation of the procedure. Initial ultrasound scanning demonstrates a large amount of ascites within the right lower abdominal quadrant. The right lower abdomen was prepped and draped in the usual sterile fashion. 1% lidocaine with epinephrine was used for local anesthesia. Under direct ultrasound guidance, a 19 gauge, 7-cm, Yueh catheter was introduced. An ultrasound image was saved for documentation purposed. The paracentesis was performed. The catheter was removed and a dressing was applied. The patient tolerated the procedure well without immediate post procedural complication. FINDINGS: A total of approximately 60 cc of bright orange/yellow fluid was removed. Samples were sent to the laboratory as requested by the clinical team. Only 60 cc drawn-- just for labs per MD.  INR 4.1 today IMPRESSION: Successful ultrasound-guided paracentesis yielding 60 cc of peritoneal fluid. Read by Lavonia Drafts [facility] Electronically Signed   By: Markus Daft M.D.   On: 12/27/2018 12:29   US Abdomen Limited Ruq  Result Date: 12/27/2018 CLINICAL DATA:  Elevated LFTs.  History of cirrhosis. EXAM: ULTRASOUND ABDOMEN LIMITED RIGHT UPPER QUADRANT COMPARISON:  CT abdomen pelvis dated December 25, 2018. Right upper quadrant ultrasound dated August 25, 2018. FINDINGS: Gallbladder: Sludge without gallstones. Mild gallbladder wall thickening is unchanged and likely related to ascites and underlying liver disease. No sonographic Murphy sign noted by sonographer. Common bile duct: Diameter: 3 mm, normal. Liver: No focal lesion identified. Unchanged nodular contour with coarsened parenchymal echogenicity. Portal vein is patent on color Doppler imaging with normal direction  of blood flow towards the liver. IMPRESSION: 1. No biliary dilatation. 2. Gallbladder sludge. 3. Unchanged cirrhosis and ascites. Electronically Signed   By: Titus Dubin M.D.   On: 12/27/2018 14:16        Scheduled Meds: . Chlorhexidine Gluconate Cloth  6 each Topical Daily  . entecavir  0.5 mg Oral QODAY  . feeding supplement  1 Container Oral BID BM  . feeding supplement (PRO-STAT SUGAR FREE 64)  30 mL Oral BID  . insulin aspart  0-9 Units Subcutaneous TID WC  . insulin aspart  4 Units Subcutaneous TID WC  . insulin glargine  10 Units Subcutaneous Q2200  . midodrine  10 mg Oral TID  . pantoprazole  40 mg Oral Daily  . sodium zirconium cyclosilicate  10 g Oral TID   Continuous Infusions: . meropenem (MERREM) IV Stopped (12/28/18 0523)  . metronidazole Stopped (12/28/18 0429)  . octreotide  (SANDOSTATIN)    IV infusion 50 mcg/hr (12/28/18 0538)  . phytonadione (VITAMIN K) IV    .  sodium bicarbonate  infusion 1000 mL 50 mL/hr at 12/28/18 0609    Assessment & Plan:    1.  Acute on chronic liver failure:  LFTs worsened since admission with total bilirubin peaking to 22 (from 8.4) and drastic rise in transaminases from less than 100 on presentation to 400s.  INR up to 4.1.  White count peaked to 37.8K (although in the setting of p.o. prednisone) and now downtrending .  Patient also hyponatremic and hyperkalemic with acute renal failure.  Patient mentating well, ammonia level at 70.  His MELD score is 38 and GI in discussions with transplant centers in the area for possible transfer.  He received  vitamin K and IV albumin x2.  Patient transferred to stepdown status for closer monitoring and consulted intensivist on call for consultation. Ultrasonogram abdomen showed CBD at 3 mm, gallbladder sludge, cirrhosis and ascites.Might need repeat paracentesis if ascitis continues to worsen.  2.Spontaneous bacterial peritonitis: Patient underwent paracentesis on June 5 with drainage of 2.7 L  fluid suggestive of SBP.  Peritoneal fluid growing gram-negative rods.  Patient admitted with Rocephin/Flagyl, now changed to IV meropenem/Flagyl and started on entecavir.  Steroids discontinued per recommendation of GI/hepatology.  Patient also underwent repeat diagnostic paracentesis 6/7 (30ml) with no growth so far, will follow-up ID and sensitivities from prev positive cx.   3.  Acute renal failure: Seen by nephrology and started on Midodrin/octreotide for possible hepatorenal syndrome. Off diuretics. Undergoing HD catheter placement for possible RRT given volume overload,oliguria, worsening uremia, acidosis and hyponatremia. Please refer to nephrology note for details.  4. Chronic hepatitis B: Continue entecavir. Off Aldactone and Lasix.  Per our records patient had positive hepatitis B core antibody and negative PCR for hepatitis B virus in Dec 2019.  He also had positive hep C virus antibody but undetected on PCR.  Defer further recommendations to GI  5.  Diabetes mellitus type II, uncontrolled: Continue sliding scale insulin. Add premeal insulin and increase Lantus dosing given hyperglycemia . Watch for hypoglycemia given poor oral intake, discontinuation of steroids and worsening renal function.  6.  Hyperkalemia: Continue Lokelma. Repeat potassium level improved from 6.4-5.6. Off Aldactone  7.  Hyponatremia: Hypervolemic vs diuretic induced. Sodium fluctuating 121-125.  Off diuretics.Nephrology following closely and anticipate temporary dialysis.   8. Nausea vomiting/poor oral intake: Could be related to hyponatremia/uremia.  Patient feels constipated.  Ordered lactulose as needed.  Continue PPI, Zofran as needed  DVT prophylaxis: SCDs given thrombocytopenia/significant coagulopathy/ongoing procedures Code Status: Full code Family / Patient Communication: Discussed with daughter in detail.   Disposition Plan: Will need transfer to tertiary care center although his insurance status appears  to be a barrier.?  Try [facility].  Appreciate GI service's relentless efforts in communicating with hepatology/liver transplant centers in the area.  Social work consulted for emergency Medicaid approval process     LOS: 2 days    Time spent: 35 minutes    Guilford Shi, MD [facility] Pager [phone]  If 7PM-7AM, please contact night-coverage [email or web address] Password [facility] 12/28/2018, 11:38 AM

## 2018-12-28 NOTE — Progress Notes (Signed)
Referring Physician(s): [place]  Supervising Physician: Aletta Edouard  Patient Status:  [facility] - In-pt  Chief Complaint: Abdominal pain/distention  Subjective: Patient familiar to IR service from [place] liver biopsy on 11/16/18  as well as paracenteses on 12/30/2018 yielding 2.7 L of fluid and 6/7.  WBC count of fluid initially was 21,725, now down to 10,550.  Gram neg rods in culture. He has a known history of cirrhosis, autoimmune hepatitis/chronic hep B;  Also with AKI, anemia, hyponatremia. Latest creat 3.89.  Request now received for temporary HD catheter placement.  Doing fair this a.m.  Still has abdominal distention/discomfort.  Past Medical History:  Diagnosis Date   Anemia    Cirrhosis ([place])    with ascites   Diabetes mellitus ([place])    Essential hypertension    Frequent headaches    Rotator cuff arthropathy of right shoulder    Past Surgical History:  Procedure Laterality Date   IR TRANSCATHETER BX  11/16/2018   IR US GUIDE VASC ACCESS RIGHT  11/16/2018   IR VENOGRAM HEPATIC WO HEMODYNAMIC EVALUATION  11/16/2018   SHOULDER ARTHROSCOPY W/ ROTATOR CUFF REPAIR Right       Allergies: Patient has no known allergies.  Medications: Prior to Admission medications   Medication Sig Start Date End Date Taking? Authorizing Provider  entecavir (BARACLUDE) 0.5 MG tablet Take 1 tablet (0.5 mg total) by mouth daily. 11/18/18  Yes Armbruster, Carlota Raspberry, MD  furosemide (LASIX) 20 MG tablet Take 1 tablet (20 mg total) by mouth daily. 10/06/18  Yes Armbruster, Carlota Raspberry, MD  Insulin Glargine (LANTUS SOLOSTAR) 100 UNIT/ML Solostar Pen Inject 15 Units into the skin daily at 10 pm. 12/23/18  Yes Isabelle Course, MD  METFORMIN HCL PO Take 1 tablet by mouth 2 (two) times a day.   Yes [provider]  morphine (MSIR) 15 MG tablet Take 1 tablet (15 mg total) by mouth every 4 (four) hours as needed for severe pain. 12/13/18  Yes Deno Etienne, DO  omeprazole (PRILOSEC) 40 MG capsule  Take 1 capsule (40 mg total) by mouth 2 (two) times daily. 11/10/18  Yes Armbruster, Carlota Raspberry, MD  ondansetron (ZOFRAN ODT) 4 MG disintegrating tablet Take 1 tablet (4 mg total) by mouth every 8 (eight) hours as needed for nausea or vomiting. 12/13/18  Yes Deno Etienne, DO  polyethylene glycol [facility]) packet Take 17 g by mouth 2 (two) times daily. 08/21/18  Yes Santos-Sanchez, Merlene Morse, MD  propranolol (INDERAL) 20 MG tablet Take 1 tablet (20 mg total) by mouth 2 (two) times daily. 11/05/18  Yes Armbruster, Carlota Raspberry, MD  spironolactone (ALDACTONE) 50 MG tablet Take 1 tablet (50 mg total) by mouth daily. 10/06/18  Yes Armbruster, Carlota Raspberry, MD  glucose blood (ONETOUCH VERIO) test strip Test blood sugars as directed. Dx: E11.9 09/11/16   Dorothyann Peng, NP  Insulin Pen Needle 31G X 5 MM MISC Use 1 time daily with injections 07/31/18   [name], MD  ONE TOUCH LANCETS MISC Test blood sugars as directed. Dx: E11.9 09/11/16   Dorothyann Peng, NP  propranolol (INDERAL) 20 MG/5ML solution Take 5 mLs (20 mg total) by mouth 2 (two) times a day. Patient not taking: Reported on 12/21/2018 11/06/18   Armbruster, Carlota Raspberry, MD  senna-docusate (SENOKOT-S) 8.6-50 MG tablet Take 2 tablets by mouth 2 (two) times daily. Patient not taking: Reported on 12/21/2018 08/21/18   [name], MD     Vital Signs: BP 110/65    Pulse 80  Temp 98.2 F (36.8 C) (Oral)    Resp 16    Ht 5\' 5"  (1.651 m)    Wt 144 lb 10 oz (65.6 kg)    SpO2 92%    BMI 24.07 kg/m   Physical Exam awake, alert.  Chest clear to auscultation bilaterally.  Heart with regular rate and rhythm; abdomen taut/dist,+BS, mild diffuse tenderness.  Trace pretibial edema bilaterally.  Imaging: Dg Chest 2 View  Result Date: 01/03/2019 CLINICAL DATA:  Shortness of breath EXAM: CHEST - 2 VIEW COMPARISON:  12/13/2018 FINDINGS: Low volume chest with streaky opacity at the bases. Normal heart size when accounting for lung volumes. No Kerley lines, effusion,  or pneumothorax. IMPRESSION: Low volume chest with atelectasis. Electronically Signed   By: Monte Fantasia M.D.   On: 12/30/2018 06:22   Ct Abdomen Pelvis W Contrast  Result Date: 01/14/2019 CLINICAL DATA:  Abdominal pain EXAM: CT ABDOMEN AND PELVIS WITH CONTRAST TECHNIQUE: Multidetector CT imaging of the abdomen and pelvis was performed using the standard protocol following bolus administration of intravenous contrast. CONTRAST:  142mL OMNIPAQUE IOHEXOL 300 MG/ML  SOLN COMPARISON:  12/13/2018 FINDINGS: Lower chest: Trace bilateral effusions.  Heart is normal size. Hepatobiliary: Cirrhosis with nodular shrunken liver. Gallbladder is distended. No visible stones. No biliary ductal dilatation. Pancreas: No focal abnormality or ductal dilatation. Spleen: Splenomegaly with a craniocaudal length 18 cm. No focal abnormality. Adrenals/Urinary Tract: No adrenal abnormality. No focal renal abnormality. No stones or hydronephrosis. Urinary bladder is unremarkable. Stomach/Bowel: Stomach, large and small bowel grossly unremarkable. Vascular/Lymphatic: Aortic atherosclerosis. No enlarged abdominal or pelvic lymph nodes. Reproductive: No focal abnormality. Other: Large volume ascites in the abdomen and pelvis. This is increased since prior study. Musculoskeletal: No acute bony abnormality. IMPRESSION: Cirrhosis with associated splenomegaly and enlarging large abdominal and pelvic ascites. Trace bilateral pleural effusions. Aortic atherosclerosis. Electronically Signed   By: Rolm Baptise M.D.   On: 12/26/2018 09:47   US Renal  Result Date: 12/27/2018 CLINICAL DATA:  Acute renal insufficiency. EXAM: RENAL / URINARY TRACT ULTRASOUND COMPLETE COMPARISON:  CT 12/23/2018 FINDINGS: Right Kidney: Renal measurements: 10.3 x 4.9 x 5.4 cm = volume: 143 mL. Mild increased cortical echogenicity. No mass or hydronephrosis visualized. Left Kidney: Renal measurements: 10.9 x 4.3 x 6.0 cm = volume: 146 mL. Mild increased cortical  echogenicity. No mass or hydronephrosis visualized. Bladder: Appears normal for degree of bladder distention. Incidental finding of known ascites with a few septations. IMPRESSION: Normal size kidneys without hydronephrosis. Mild increased cortical echogenicity which can be seen with medical renal disease. Electronically Signed   By: Marin Olp M.D.   On: 12/27/2018 16:08   US Paracentesis  Result Date: 12/28/2018 INDICATION: SBP; Ascites EXAM: ULTRASOUND-GUIDED PARACENTESIS COMPARISON:  Previous paracentesis. MEDICATIONS: 10 cc 1% lidocaine COMPLICATIONS: None immediate. TECHNIQUE: Informed written consent was obtained from the patient after a discussion of the risks, benefits and alternatives to treatment. A timeout was performed prior to the initiation of the procedure. Initial ultrasound scanning demonstrates a large amount of ascites within the right lower abdominal quadrant. The right lower abdomen was prepped and draped in the usual sterile fashion. 1% lidocaine with epinephrine was used for local anesthesia. Under direct ultrasound guidance, a 19 gauge, 7-cm, Yueh catheter was introduced. An ultrasound image was saved for documentation purposed. The paracentesis was performed. The catheter was removed and a dressing was applied. The patient tolerated the procedure well without immediate post procedural complication. FINDINGS: A total of approximately 60 cc of bright orange/yellow  fluid was removed. Samples were sent to the laboratory as requested by the clinical team. Only 60 cc drawn-- just for labs per MD.  INR 4.1 today IMPRESSION: Successful ultrasound-guided paracentesis yielding 60 cc of peritoneal fluid. Read by Lavonia Drafts [facility] Electronically Signed   By: Markus Daft M.D.   On: 12/27/2018 12:29   US Paracentesis  Result Date: 01/08/2019 INDICATION: Patient with history of autoimmune hepatitis, cirrhosis, ascites. Request made for diagnostic and therapeutic paracentesis. EXAM: ULTRASOUND  GUIDED DIAGNOSTIC AND THERAPEUTIC PARACENTESIS MEDICATIONS: None COMPLICATIONS: None immediate. PROCEDURE: Informed written consent was obtained from the patient/daughter after a discussion of the risks, benefits and alternatives to treatment. A timeout was performed prior to the initiation of the procedure. Initial ultrasound scanning demonstrates a moderate amount of ascites within the right upper to mid abdominal quadrant. The right upper to mid abdomen was prepped and draped in the usual sterile fashion. 1% lidocaine was used for local anesthesia. Following this, a 19 gauge, 7-cm, Yueh catheter was introduced. An ultrasound image was saved for documentation purposes. The paracentesis was performed. The catheter was removed and a dressing was applied. The patient tolerated the procedure well without immediate post procedural complication. FINDINGS: A total of approximately 2.7 liters of turbid, yellow fluid was removed. Samples were sent to the laboratory as requested by the clinical team. IMPRESSION: Successful ultrasound-guided diagnostic and therapeutic paracentesis yielding 2.7 liters of peritoneal fluid. Read by: Rowe Robert, PA-C Electronically Signed   By: Sandi Mariscal M.D.   On: 01/03/2019 12:41   US Abdomen Limited Ruq  Result Date: 12/27/2018 CLINICAL DATA:  Elevated LFTs.  History of cirrhosis. EXAM: ULTRASOUND ABDOMEN LIMITED RIGHT UPPER QUADRANT COMPARISON:  CT abdomen pelvis dated December 25, 2018. Right upper quadrant ultrasound dated August 25, 2018. FINDINGS: Gallbladder: Sludge without gallstones. Mild gallbladder wall thickening is unchanged and likely related to ascites and underlying liver disease. No sonographic Murphy sign noted by sonographer. Common bile duct: Diameter: 3 mm, normal. Liver: No focal lesion identified. Unchanged nodular contour with coarsened parenchymal echogenicity. Portal vein is patent on color Doppler imaging with normal direction of blood flow towards the liver.  IMPRESSION: 1. No biliary dilatation. 2. Gallbladder sludge. 3. Unchanged cirrhosis and ascites. Electronically Signed   By: Titus Dubin M.D.   On: 12/27/2018 14:16    Labs:  CBC: Recent Labs    12/21/18 1042 01/08/2019 0643 12/26/18 0718 12/27/18 0602  WBC 11.2* 26.9* 37.8* 31.2*  HGB 10.2* 9.7* 9.7* 10.1*  HCT 31.2* 28.8* 30.0* 31.7*  PLT 133.0* 75* 127* 139*    COAGS: Recent Labs    11/16/18 0809 12/13/18 2059 12/21/2018 0643 12/27/18 0602  INR 1.6* 2.0* 2.0* 4.1*    BMP: Recent Labs    12/27/18 0602 12/27/18 1659 12/28/18 0250 12/28/18 0911  NA 123* 124* 121* 123*  K 6.4* 5.6* 5.9* 5.9*  CL 89* 86* 87* 86*  CO2 21* 24 21* 24  GLUCOSE 163* 467* 365* 383*  BUN 52* 62* 67* 70*  CALCIUM 8.2* 7.8* 7.6* 7.8*  CREATININE 3.31* 3.61* 3.82* 3.89*  GFRNONAA 19* 17* 16* 16*  GFRAA 22* 20* 19* 18*    LIVER FUNCTION TESTS: Recent Labs    12/16/18 1110 01/12/2019 0643 12/26/2018 1539 12/27/18 0602 12/28/18 0250  BILITOT 2.7* 9.5* 8.4* 22.5* 22.9*  AST 37 27  --  395* 212*  ALT 60* 29  --  425* 266*  ALKPHOS 82 53  --  102 96  PROT 8.0 7.1  --  6.3* 5.9*  ALBUMIN 2.4* 2.1*  --  2.9* 3.1*   3.1*    Assessment and Plan: Patient with history of chronic hep B, autoimmune hepatitis, cirrhosis,  ascites with SBP, AKI versus HRS, anemia/hyponatremia; request received for temporary HD catheter placement; details/risks of procedure, including but not limited to, internal bleeding, infection, injury to adjacent structures discussed with patient/daughter with their understanding and consent.  Procedure scheduled for today.   Electronically Signed: D. Rowe Robert, PA-C 12/28/2018, 10:28 AM   I spent a total of 20 minutes at the the patient's bedside AND on the patient's hospital floor or unit, greater than 50% of which was counseling/coordinating care for temporary HD catheter placement    Patient ID: Michael Hamilton, male   DOB: Mar 25, 1961, 58 y.o.   MRN: [id number]

## 2018-12-28 NOTE — Progress Notes (Signed)
Pharmacy Antibiotic Note  Michael Hamilton is a 58 y.o. male with hx autoimmune hepatitis and cirrhosis presented to the ED on 01/17/2019 with abd pain.  Patient was started on ceftriaxone and flagyl for SBP on admission and transitioned to meropenem on 6/7. ID recommends to continue with meropenem for now.  Today, 12/28/2018: - scr 3.89 (crcl~18) - afeb - wbc 31.2   Plan: - continue meropenem 1gm IV q12h - monitor renal function ___________________________________________  Height: 5\' 5"  (165.1 cm) Weight: 144 lb 10 oz (65.6 kg) IBW/kg (Calculated) : 61.5  Temp (24hrs), Avg:98 F (36.7 C), Min:97.7 F (36.5 C), Max:98.3 F (36.8 C)  Recent Labs  Lab 12/22/2018 0643 12/30/2018 0644 12/24/2018 1003  12/26/18 0718 12/26/18 1507 12/27/18 0602 12/27/18 1659 12/28/18 0250 12/28/18 0911  WBC 26.9*  --   --   --  37.8*  --  31.2*  --   --   --   CREATININE 1.24  --   --    < > 2.00* 2.34* 3.31* 3.61* 3.82* 3.89*  LATICACIDVEN  --  6.4* 5.1*  --   --   --   --   --   --   --    < > = values in this interval not displayed.    Estimated Creatinine Clearance: 17.8 mL/min (A) (by C-G formula based on SCr of 3.89 mg/dL (H)).    No Known Allergies  Antimicrobials this admission: 6/5 CTX> 6/7 6/6 flagyl >>6/8 6/7 meropenem >>   Microbiology results: 6/5 peritoneal fluid: GNRs - re-incubated for better growth 6/6 BCx2 ngtd  Thank you for allowing pharmacy to be a part of this patient's care.  Lynelle Doctor 12/28/2018 5:02 PM

## 2018-12-28 NOTE — Progress Notes (Signed)
2D Echocardiogram has been performed.  Michael Hamilton 12/28/2018, 1:53 PM

## 2018-12-28 NOTE — Progress Notes (Addendum)
Patient ID: Michael Hamilton, male   DOB: [birth year], 59 y.o.   MRN: [id number]    Progress Note   Subjective   Day #4  Daughter at bedside Having less abdominal discomfort- abdomen a bit more tight than yesterday  Foley just placed - not making much -75 cc  Now on midodrine/octreotide /Albumin for possible  HRS Continues on Meropenum /flagyl   Na 121/creat 3.8 Tbili 22.0/AST 212/ALT 266-- transaminases improved INR -Pend     Objective   Vital signs in last 24 hours: Temp:  [97.9 F (36.6 C)-98.7 F (37.1 C)] 98.2 F (36.8 C) (06/08 0800) Pulse Rate:  [80-87] 80 (06/08 0200) Resp:  [15-27] 16 (06/08 0600) BP: (93-116)/(53-74) 110/65 (06/08 0600) SpO2:  [90 %-93 %] 92 % (06/08 0200) Weight:  [65.6 kg] 65.6 kg (06/07 1710) Last BM Date: 12/27/18 General:  [name] in NAD, deeply icteric, chronically ill appearing Heart:  Regular rate and rhythm; no murmurs Lungs: Respirations even and unlabored, lungs CTA bilaterally Abdomen:  Tight ascites , mild tenderness diffusely Normal bowel sounds. Extremities:  1+ edema . Neurologic:  Alert and oriented,  grossly normal neurologically. Psych:  Cooperative. Normal mood and affect.  Intake/Output from previous day: 06/07 0701 - 06/08 0700 In: 3500 [P.O.:60; I.V.:1221.1; IV Piggyback:299.9] Out: 225 [Urine:225] Intake/Output this shift: No intake/output data recorded.  Lab Results: Recent Labs    12/26/18 0718 12/27/18 0602  WBC 37.8* 31.2*  HGB 9.7* 10.1*  HCT 30.0* 31.7*  PLT 127* 139*   BMET Recent Labs    12/27/18 0602 12/27/18 1659 12/28/18 0250  NA 123* 124* 121*  K 6.4* 5.6* 5.9*  CL 89* 86* 87*  CO2 21* 24 21*  GLUCOSE 163* 467* 365*  BUN 52* 62* 67*  CREATININE 3.31* 3.61* 3.82*  CALCIUM 8.2* 7.8* 7.6*   LFT Recent Labs    12/28/18 0250  PROT 5.9*  ALBUMIN 3.1*   3.1*  AST 212*  ALT 266*  ALKPHOS 96  BILITOT 22.9*  BILIDIR 16.6*  IBILI 6.3*   PT/INR Recent Labs    12/27/18 0602    LABPROT 38.7*  INR 4.1*    Studies/Results: US Renal  Result Date: 12/27/2018 CLINICAL DATA:  Acute renal insufficiency. EXAM: RENAL / URINARY TRACT ULTRASOUND COMPLETE COMPARISON:  CT 01/15/2019 FINDINGS: Right Kidney: Renal measurements: 10.3 x 4.9 x 5.4 cm = volume: 143 mL. Mild increased cortical echogenicity. No mass or hydronephrosis visualized. Left Kidney: Renal measurements: 10.9 x 4.3 x 6.0 cm = volume: 146 mL. Mild increased cortical echogenicity. No mass or hydronephrosis visualized. Bladder: Appears normal for degree of bladder distention. Incidental finding of known ascites with a few septations. IMPRESSION: Normal size kidneys without hydronephrosis. Mild increased cortical echogenicity which can be seen with medical renal disease. Electronically Signed   By: Marin Olp M.D.   On: 12/27/2018 16:08   US Paracentesis  Result Date: 12/28/2018 INDICATION: SBP; Ascites EXAM: ULTRASOUND-GUIDED PARACENTESIS COMPARISON:  Previous paracentesis. MEDICATIONS: 10 cc 1% lidocaine COMPLICATIONS: None immediate. TECHNIQUE: Informed written consent was obtained from the patient after a discussion of the risks, benefits and alternatives to treatment. A timeout was performed prior to the initiation of the procedure. Initial ultrasound scanning demonstrates a large amount of ascites within the right lower abdominal quadrant. The right lower abdomen was prepped and draped in the usual sterile fashion. 1% lidocaine with epinephrine was used for local anesthesia. Under direct ultrasound guidance, a 19 gauge, 7-cm, Yueh catheter was introduced. An ultrasound image was  saved for documentation purposed. The paracentesis was performed. The catheter was removed and a dressing was applied. The patient tolerated the procedure well without immediate post procedural complication. FINDINGS: A total of approximately 60 cc of bright orange/yellow fluid was removed. Samples were sent to the laboratory as requested by the  clinical team. Only 60 cc drawn-- just for labs per MD.  INR 4.1 today IMPRESSION: Successful ultrasound-guided paracentesis yielding 60 cc of peritoneal fluid. Read by Lavonia Drafts [facility] Electronically Signed   By: Markus Daft M.D.   On: 12/27/2018 12:29   US Abdomen Limited Ruq  Result Date: 12/27/2018 CLINICAL DATA:  Elevated LFTs.  History of cirrhosis. EXAM: ULTRASOUND ABDOMEN LIMITED RIGHT UPPER QUADRANT COMPARISON:  CT abdomen pelvis dated December 25, 2018. Right upper quadrant ultrasound dated August 25, 2018. FINDINGS: Gallbladder: Sludge without gallstones. Mild gallbladder wall thickening is unchanged and likely related to ascites and underlying liver disease. No sonographic Murphy sign noted by sonographer. Common bile duct: Diameter: 3 mm, normal. Liver: No focal lesion identified. Unchanged nodular contour with coarsened parenchymal echogenicity. Portal vein is patent on color Doppler imaging with normal direction of blood flow towards the liver. IMPRESSION: 1. No biliary dilatation. 2. Gallbladder sludge. 3. Unchanged cirrhosis and ascites. Electronically Signed   By: Titus Dubin M.D.   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-admitted with SBP  Gram neg rods in peritoneal fluid- ID pend- ON Flagyl and Merepenum- + organisms on fluid gram stain yesterday - cultures pending - will also add cytology to be done from yesterdays specimen- cell counts significantly improved from initial tap  WBC pend this am Steroids discontinued yesterday Feels better  But increasing ascites- hold on tap today , if more uncomfortable  tomorrow may consider small volume tap of 2 liters for comfort  Hep B DNA and hep b sAG  Pending  ( has been off antiviral recently )  #2 AKI vs HRS - renal managing - continue with trial of albumin, octreotide , Midodrine Foley placed  May  need RRT  #3 Hyponatremia-- repeat this pm - renal managing #4 anemia  #5 coagulopathy - Vit K again today - change to IV 10 mg today and again in am  Social work to see urgently this morning to help facilitate emergency Medicaid, so that patient can be transferred to a liver transplant center if necessary and if patient does not have overall clinical stabilization from a liver perspective - anticipating transfer to [facility] if emergency Medicaid can be obtained but may need to consider discussion with [facility] as well [facility] has already declined patient per prior conversation over the weekend).  Have ordered TTE to evaluate for underlying cardiac issues for progressive anasarca, HIV for baseline, Varicella Ab, Factor VII to assess for liver synthetic function, Quantiferon gold since patient was on steroids and is relatively immune suppressed state     Principal Problem:   Abdominal pain Active Problems:   Diabetes mellitus without complication (HCC)   Essential hypertension   Autoimmune hepatitis ([place])   Hyponatremia   SBP (spontaneous bacterial peritonitis) ([place])   Cirrhosis of liver ([place])     LOS: 2 days   Amy Esterwood  12/28/2018, 9:30 AM     Attending 60 Attestation   I have taken an interval history, reviewed the chart and examined  the patient.   Opportunity to discuss this case with Dr. Havery Moros over the weekend and had an opportunity to talk with him and his daughter for an extensive period in time today.  Overall, he is clinically stable.  His current clinical presentation is most concerning for the possibility of underlying progressive liver synthetic dysfunction as a result of severe SBP and/or the possibility of progressive AKI from contrast-induced nephropathy as well as possibly HRS as a result of his underlying SBP.  I have significant concern based on the patient's clinical exam that he will have progressive issues with tolerating oral intake as result of his  developing ascites/progressive reaccumulation.  Thankfully, he has a urinary catheter in place to monitor I's and O's and his weights.  I appreciate our critical care service as well as our nephrology service in regards to monitoring patient with potential need for renal replacement therapy if his urine output does not increase.  I have discussed this case with [facility] and the possibility of a transfer being necessary.  He has been seen by our financial coordinators in an effort of trying to better understand his insurance issues as well as try and obtain Medicaid emergency funding.  I had a long conversation with the patient's daughter today.  He has been in the [place] since 2004.  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  Unfortunately he has medical issues that caused him to stop working approximately 2-1/2 to 3 years ago.  4 to 5 months ago he was doing exercises on a daily basis and walking around his neighborhood as a means of trying to stay active.  Portion of the last 2 months or so he has not been able to be as functional as he previously was.  Interestingly, prior to being admitted to the hospital you had gone fishing with his friends.  He has been treated for hepatitis B core positivity prior to autoimmune hepatitis therapy being initiated.  However he may have missed 2 to 4 days worth of hepatitis B treatment within a week prior to his admission to the hospital.  Hepatitis B serologies and DNA are currently pending in the possibility that he could have a hepatitis B reactivation.  In regards to underlying support the patient lives with his wife and daughter.  His wife works 4 to 5 days/week in [place].  His daughter works in a Company secretary as a result of the COVID-19 pandemic has been quarantined but been taking care of her father.  She also helps try to help with bills for his family.  He has been a [place]  citizen since 2015.  If his daughter were to become sick there are family members that could be available to help the patient but his daughter and his wife are his main areas of support.  He does speak [place] as well as a dialect of [place].  He has not had any alcohol consumption for nearly 20 years.  No smoking history.  No family disease history of liver disease.  We are awaiting speciation and antibiotic sensitivity for which growing in his peritoneal fluid.  I think will have to make a decision tomorrow about potential fluid being reevaluated and sent for culture but more so to do a very minimal paracentesis of 1 to 2 L to see if that will help the patient in regards to his bloating sensation.  We will have  to be aggressive with a bowel regimen for him as well.  Because of his autoimmune status I do think we should evaluate for other potential causes of decompensation so some laboratories will be sent.  Due to progressive anasarca and ascites we will evaluate his heart with an echocardiogram to ensure he does not have evidence of cardiac manifestations of disease.  I do not think that there is currently a role for norepinephrine with maps being in the 70s to 80s but if his blood pressure were to drop we may consider the use of norepinephrine or if he goes on hemodialysis or CRRT then consider the role of that as well.  Overall I have asked spoken with the patient and daughter in effort of trying to help them understand things in the long-term.  He could require a transplant evaluation acutely or potentially in the long-term depending on how things go and they are willing to move forward with that if it is felt that it is necessary.  I have begun to plan to see this to the potential role of a transplant evaluation.  He will not undergo formal transplant evaluation unless things progress or he is transferred to an outside facility.  We will update our colleagues at [facility] and consider further areas or  options based on his financial approval for Medicaid.  If he were to require higher level of care than what can be offered in [place] he may be transferred to a tertiary or quaternary center however the family and patient know that without insurance approval a liver transplant evaluation could not be performed.  I agree with the Advanced Practitioner's note, impression, and recommendations with updates and my documentation above.   Justice Britain, MD [facility] Advanced Endoscopy Office # [phone]

## 2018-12-28 NOTE — Progress Notes (Signed)
[facility] Progress Note  Subjective: family interprets this am, no SOB, 225 cc UOP  Vitals:   12/28/18 0100 12/28/18 0200 12/28/18 0300 12/28/18 0400  BP: 110/74 106/68  106/67  Pulse: 80 80    Resp: 18 16  15   Temp:   98 F (36.7 C)   TempSrc:   Oral   SpO2: 92% 92%    Weight:      Height:        Inpatient medications: . Chlorhexidine Gluconate Cloth  6 each Topical Daily  . entecavir  0.5 mg Oral QODAY  . feeding supplement  1 Container Oral BID BM  . feeding supplement (PRO-STAT SUGAR FREE 64)  30 mL Oral BID  . insulin aspart  0-9 Units Subcutaneous TID WC  . insulin glargine  5 Units Subcutaneous Q2200  . midodrine  7.5 mg Oral TID  . pantoprazole  40 mg Oral Daily  . phytonadione  10 mg Oral Once  . sodium zirconium cyclosilicate  10 g Oral TID   . meropenem (MERREM) IV Stopped (12/28/18 0523)  . metronidazole Stopped (12/28/18 0429)  . octreotide  (SANDOSTATIN)    IV infusion 50 mcg/hr (12/28/18 0538)  .  sodium bicarbonate  infusion 1000 mL 75 mL/hr at 12/28/18 0331   acetaminophen **OR** acetaminophen, HYDROmorphone (DILAUDID) injection, lactulose, morphine, ondansetron **OR** ondansetron (ZOFRAN) IV    Exam: Alert, no oxgyen, calm   no jvd Chest cta bilat  Cor reg no RG  Abd 2-3+ ascites, nontender  Ext no sig edema  NF, ox 3   UA neg  UNa 6/7 <10  UCr 151  CXR 6/5 - low vol xray, +atx  Renal US > IMPRESSION: Normal size kidneys without hydronephrosis. Mild increased cortical echogenicity which can be seen with medical renal disease   Assessment/Plan: 1.  Acute kidney injury: possible ATN from recent sepsis episode also may be element of HRS given low UNa.  Cont trial of intravenous albumin with midodrine and octreotide. ^midodrine 10 tid. Creat rising w/ poor uop, may need RRT soon if not recovering. Will request temp cath for IR.  2.  Hyperkalemia: In part from recent use of spironolactone.  Cont Lokelma tid, avoid repeating  Kayexalate.  Limit dietary potassium intake.  Avoid lactated Ringer's.  3.  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  Attempting to transfer to [facility] or [facility] for liver transplantation but insurance is an issue. 4.  Anemia of chronic disease: Check iron studies and decide on need for replacement versus ESA. 5.  Hyponatremia: not grossly overloaded, +ascites is significant though. Na+ down 121, repeat at 2pm , may need hypertonic saline or dialysis if worsens. Fluid restrict.        [facility] 12/28/2018, 5:47 AM  Iron/TIBC/Ferritin/ %Sat    Component Value Date/Time   IRON 110 08/04/2018 0947   TIBC 225 (L) 08/04/2018 0947   FERRITIN 256.3 10/06/2018 1701   IRONPCTSAT 49 08/04/2018 0947   Recent Labs  Lab 12/27/18 0602  12/28/18 0250  NA 123*   < > 121*  K 6.4*   < > 5.9*  CL 89*   < > 87*  CO2 21*   < > 21*  GLUCOSE 163*   < > 365*  BUN 52*   < > 67*  CREATININE 3.31*   < > 3.82*  CALCIUM 8.2*   < > 7.6*  PHOS  --   --  7.0*  ALBUMIN 2.9*  --  3.1*  3.1*  INR 4.1*  --   --    < > = values in this interval not displayed.   Recent Labs  Lab 12/28/18 0250  AST 212*  ALT 266*  ALKPHOS 96  BILITOT 22.9*  PROT 5.9*   Recent Labs  Lab 12/27/18 0602  WBC 31.2*  HGB 10.1*  HCT 31.7*  PLT 139*

## 2018-12-28 NOTE — Progress Notes (Signed)
Daughter assisted with interpretation.  He is feeling better.  Decreased abdominal discomfort.  Tolerating diet.  Making some urine.  RN getting ready to place foley.  Renal managing midodrine, albumin, octreotide.  They will have IR arrange for catheter.  GI following, and managing entecavir.  Have also recommended transfer to [facility] or [facility] if he can get insurance coverage.  ABx for SBP per primary team.  PCCM will sign off.  Please call if further assistance needed while he is in hospital.  Chesley Mires, MD [place] 12/28/2018, 9:27 AM

## 2018-12-28 NOTE — Procedures (Signed)
Interventional Radiology Procedure Note  Procedure: Non-tunneled HD catheter placement  Complications: None  Estimated Blood Loss: < 10 mL  Findings: 15 cm, 13 Fr Trialysis catheter placed via right IJ with tip in upper RA. OK to use catheter.  Venetia Night. Kathlene Cote, M.D Pager:  [phone]

## 2018-12-28 NOTE — Consult Note (Signed)
[facility]    Date of Admission:  12/23/2018           Day 14 metronidazole        Day 2 meropenem       Reason for Consult: Spontaneous bacterial peritonitis    Referring Provider: Dr. Guilford Shi  Assessment: He has developed polymicrobial bacterial peritonitis in the setting of decompensated cirrhosis.  This may be a "spontaneous" event but he may also have microperforation in the setting of sigmoid colitis.  I will continue renally dosed meropenem alone pending final culture results.  Since meropenem offers excellent anaerobic coverage I will stop metronidazole now.  I agree with continuing renally dosed entecavir.  Plan: 1. Continue meropenem and entecavir (I will ask pharmacy to oversee dosing) 2. Discontinue metronidazole 3. Await final culture results  Principal Problem:   SBP (spontaneous bacterial peritonitis) ([place]) Active Problems:   Acute kidney injury ([place])   Autoimmune hepatitis ([place])   Cirrhosis of liver (HCC)   Hepatitis C antibody test positive   Hepatitis B core antibody positive   Diabetes mellitus without complication (HCC)   Essential hypertension   Microcytic anemia   Paraproteinemia   Hyponatremia   Thrombocytopenia (HCC)   Esophageal varices (HCC)   Scheduled Meds:  Chlorhexidine Gluconate Cloth  6 each Topical Daily   [START ON 2019-01-15] entecavir  0.5 mg Oral Q72H   feeding supplement  1 Container Oral BID BM   feeding supplement (PRO-STAT SUGAR FREE 64)  30 mL Oral BID   insulin aspart  0-9 Units Subcutaneous TID WC   insulin aspart  4 Units Subcutaneous TID WC   insulin glargine  10 Units Subcutaneous Q2200   midodrine  10 mg Oral TID   pantoprazole  40 mg Oral Daily   sodium zirconium cyclosilicate  10 g Oral TID   Continuous Infusions:  meropenem (MERREM) IV Stopped (12/28/18 0523)   octreotide  (SANDOSTATIN)    IV infusion 50 mcg/hr (12/28/18 0538)   [START ON 12/29/2018]  phytonadione (VITAMIN K) IV     phytonadione (VITAMIN K) IV      sodium bicarbonate  infusion 1000 mL 50 mL/hr at 12/28/18 0609   PRN Meds:.acetaminophen **OR** acetaminophen, HYDROmorphone (DILAUDID) injection, lactulose, morphine, ondansetron **OR** ondansetron (ZOFRAN) IV  HPI: Michael Hamilton is a 58 y.o. male who was found to have cirrhosis late last year and isolated hepatitis B core IgG antibody (IgM was negative).  He was also found to be hepatitis C antibody positive but his hepatitis C viral load was undetectable suggesting spontaneous clearance.  His cirrhosis was complicated by ascites and esophageal varices.  He underwent liver biopsy in April which showed chronic active hepatitis.  He was given a presumptive diagnosis of autoimmune hepatitis and started on prednisone and prophylactic entecavir to prevent reactivation of hepatitis B.  His daughter tells me that he was feeling better and improving until little over 2 weeks ago when he had sudden onset of severe abdominal pain, nausea and vomiting.  He was seen in the emergency department on 12/13/2018.  CT scan noted his known cirrhosis and splenomegaly.  There was only small volume of ascites.  There was thickening of the sigmoid colon suspicious for infectious or inflammatory colitis.  He was discharged on oral ciprofloxacin and metronidazole which he did not take.  Because of worsening abdominal pain and distention he was admitted on 12/24/2018.  He underwent a 2.7 L paracentesis.  White blood cell count was 21,725.  No organisms were seen on Gram stain and cultures are growing at least 2 organisms and may be a third.  One organism is E. coli and there is a possible anaerobe.  No susceptibilities are available yet.  He underwent repeat paracentesis yesterday and only 60 cc of fluid was aspirated.  His white blood cell count was down to 10,550.  No organisms were seen on Gram stain and cultures are pending.  His daughter indicates that he seems to be  in more pain this afternoon and seems more distended.  He has developed acute kidney injury.  He is due to have a temporary hemodialysis catheter placed and will probably transfer to [facility] for renal replacement therapy.  Review of Systems: Review of Systems  Unable to perform ROS: Language    Past Medical History:  Diagnosis Date   Anemia    Cirrhosis ([place])    with ascites   Diabetes mellitus ([place])    Essential hypertension    Frequent headaches    Rotator cuff arthropathy of right shoulder     Social History   Tobacco Use   Smoking status: Former Smoker    Years: 2.00   Smokeless tobacco: Never Used   Tobacco comment: quit when he was 21  Substance Use Topics   Alcohol use: No   Drug use: No    Family History  Problem Relation Age of Onset   Diabetes Neg Hx    Colon cancer Neg Hx    Esophageal cancer Neg Hx    No Known Allergies  OBJECTIVE: Blood pressure 112/76, pulse 77, temperature 97.7 F (36.5 C), temperature source Oral, resp. rate 16, height 5\' 5"  (1.651 m), weight 65.6 kg, SpO2 94 %.  Physical Exam Constitutional:      Comments: His daughter is at the bedside and provided the history today.  He appears quite uncomfortable.  He is clutching his abdomen.  Cardiovascular:     Rate and Rhythm: Normal rate and regular rhythm.     Heart sounds: No murmur.  Pulmonary:     Effort: Pulmonary effort is normal.     Breath sounds: Normal breath sounds.  Abdominal:     General: There is distension.     Tenderness: There is abdominal tenderness.     Comments: He has tense ascites and absent bowel sounds.     Lab Results Lab Results  Component Value Date   WBC 31.2 (H) 12/27/2018   HGB 10.1 (L) 12/27/2018   HCT 31.7 (L) 12/27/2018   MCV 77.3 (L) 12/27/2018   PLT 139 (L) 12/27/2018    Lab Results  Component Value Date   CREATININE 3.89 (H) 12/28/2018   BUN 70 (H) 12/28/2018   NA 123 (L) 12/28/2018   K 5.9 (H) 12/28/2018   CL 86  (L) 12/28/2018   CO2 24 12/28/2018    Lab Results  Component Value Date   ALT 266 (H) 12/28/2018   AST 212 (H) 12/28/2018   ALKPHOS 96 12/28/2018   BILITOT 22.9 ([place]) 12/28/2018     Microbiology: Recent Results (from the past 948 hour(s))  Helicobacter pylori special antigen     Status: None   Collection Time: 12/24/18  2:23 PM  Result Value Ref Range Status   MICRO NUMBER: [id number]  Final   SPECIMEN QUALITY Adequate  Final   SOURCE: STOOL  Final   STATUS: FINAL  Final   RESULT:   Final  Not Detected  Antimicrobials, proton pump inhibitors, and bismuth preparations inhibit H. pylori and ingestion up to two weeks prior to testing may cause false negative results. If clinically indicated the test should be repeated on a new specimen  obtained two weeks after discontinuing treatment.   SARS Coronavirus 2 (CEPHEID - Performed in [facility] hospital lab), Hosp Order     Status: None   Collection Time: 12/26/2018  6:42 AM  Result Value Ref Range Status   SARS Coronavirus 2 NEGATIVE NEGATIVE Final    Comment: (NOTE) If result is NEGATIVE SARS-CoV-2 target nucleic acids are NOT DETECTED. The SARS-CoV-2 RNA is generally detectable in upper and lower  respiratory specimens during the acute phase of infection. The lowest  concentration of SARS-CoV-2 viral copies this assay can detect is 250  copies / mL. A negative result does not preclude SARS-CoV-2 infection  and should not be used as the sole basis for treatment or other  patient management decisions.  A negative result may occur with  improper specimen collection / handling, submission of specimen other  than nasopharyngeal swab, presence of viral mutation(s) within the  areas targeted by this assay, and inadequate number of viral copies  (<250 copies / mL). A negative result must be combined with clinical  observations, patient history, and epidemiological information. If result is POSITIVE SARS-CoV-2 target nucleic acids are  DETECTED. The SARS-CoV-2 RNA is generally detectable in upper and lower  respiratory specimens dur ing the acute phase of infection.  Positive  results are indicative of active infection with SARS-CoV-2.  Clinical  correlation with patient history and other diagnostic information is  necessary to determine patient infection status.  Positive results do  not rule out bacterial infection or co-infection with other viruses. If result is PRESUMPTIVE POSTIVE SARS-CoV-2 nucleic acids MAY BE PRESENT.   A presumptive positive result was obtained on the submitted specimen  and confirmed on repeat testing.  While 2019 novel coronavirus  (SARS-CoV-2) nucleic acids may be present in the submitted sample  additional confirmatory testing may be necessary for epidemiological  and / or clinical management purposes  to differentiate between  SARS-CoV-2 and other Sarbecovirus currently known to infect humans.  If clinically indicated additional testing with an alternate test  methodology [id number]) is advised. The SARS-CoV-2 RNA is generally  detectable in upper and lower respiratory sp ecimens during the acute  phase of infection. The expected result is Negative. Fact Sheet for Patients:  StrictlyIdeas.no Fact Sheet for Healthcare Providers: BankingDealers.co.za This test is not yet approved or cleared by the [place] FDA and has been authorized for detection and/or diagnosis of SARS-CoV-2 by FDA under an Emergency Use Authorization (EUA).  This EUA will remain in effect (meaning this test can be used) for the duration of the COVID-19 declaration under Section 564(b)(1) of the Act, 21 U.S.C. section 360bbb-3(b)(1), unless the authorization is terminated or revoked sooner. Performed at [facility], [place] [address] [place], [place] [address]   Culture, blood (routine x 2)     Status: None (Preliminary result)   Collection Time:  12/29/2018  7:40 AM  Result Value Ref Range Status   Specimen Description   Final    BLOOD LEFT HAND Performed at [facility], [place] [address], [place], [place] [address]    Special Requests   Final    BOTTLES DRAWN AEROBIC AND ANAEROBIC Blood Culture adequate volume Performed at [facility] [address] [place],  [place] [address]    Culture   Final    NO GROWTH 3 DAYS Performed at [facility], [place] [address], [place], [place] [address]    Report Status PENDING  Incomplete  Culture, blood (routine x 2)     Status: None (Preliminary result)   Collection Time: 12/24/2018  7:50 AM  Result Value Ref Range Status   Specimen Description   Final    BLOOD BLOOD LEFT WRIST Performed at [facility] [address], [place], [place] [address]    Special Requests   Final    BOTTLES DRAWN AEROBIC AND ANAEROBIC Blood Culture results may not be optimal due to an excessive volume of blood received in culture bottles Performed at [facility] [address] [place], [place] [address]    Culture   Final    NO GROWTH 3 DAYS Performed at [facility], [place] [address], [place], [place] [address]    Report Status PENDING  Incomplete  Culture, body fluid-bottle     Status: None (Preliminary result)   Collection Time: 01/08/2019 12:44 PM  Result Value Ref Range Status   Specimen Description PERITONEAL  Final   Special Requests NONE  Final   Gram Stain   Final    GRAM NEGATIVE RODS IN BOTH AEROBIC AND ANAEROBIC BOTTLES CRITICAL RESULT CALLED TO, READ BACK BY AND VERIFIED WITH: JOHNSON AT [place] [id number] BY SJW    Culture   Final    GRAM NEGATIVE RODS IDENTIFICATION AND SUSCEPTIBILITIES TO FOLLOW CULTURE REINCUBATED FOR BETTER GROWTH Performed at [facility], [place] [address] [place], [place] [address]    Report Status PENDING  Incomplete  Gram stain     Status: None   Collection Time: 12/30/2018 12:44 PM  Result  Value Ref Range Status   Specimen Description PERITONEAL  Final   Special Requests NONE  Final   Gram Stain   Final    ABUNDANT WBC PRESENT,BOTH PMN AND MONONUCLEAR NO ORGANISMS SEEN Performed at [facility], [address], [place], [place] [address]    Report Status 01/18/2019 FINAL  Final  Urine culture     Status: Abnormal   Collection Time: 12/22/2018  1:12 PM  Result Value Ref Range Status   Specimen Description   Final    URINE, RANDOM Performed at [facility] [address] [place], [place] [address]    Special Requests   Final    NONE Performed at [facility], [place] [address] [place], [place] [address]    Culture MULTIPLE SPECIES PRESENT, SUGGEST RECOLLECTION (A)  Final   Report Status 12/27/2018 FINAL  Final  Culture, body fluid-bottle     Status: None (Preliminary result)   Collection Time: 12/27/18 12:25 PM  Result Value Ref Range Status   Specimen Description PERITONEAL FLUID  Final   Special Requests BOTTLES DRAWN AEROBIC AND ANAEROBIC  Final   Culture   Final    NO GROWTH < 24 HOURS Performed at [facility], [place] [address] [place], [place] [address]    Report Status PENDING  Incomplete  Gram stain     Status: None   Collection Time: 12/27/18 12:25 PM  Result Value Ref Range Status   Specimen Description PERITONEAL FLUID  Final   Special Requests NONE  Final   Gram Stain   Final    WBC PRESENT, PREDOMINANTLY PMN NO ORGANISMS SEEN CYTOSPIN SMEAR Performed  at [facility], [place] [address] [place], [place] [address]    Report Status 12/28/2018 FINAL  Final  MRSA PCR Screening     Status: None   Collection Time: 12/27/18  2:17 PM  Result Value Ref Range Status   MRSA by PCR NEGATIVE NEGATIVE Final    Comment:        The GeneXpert MRSA Assay (FDA approved for NASAL specimens only), is one component of a comprehensive MRSA colonization surveillance program. It is not intended to diagnose  MRSA infection nor to guide or monitor treatment for MRSA infections. Performed at [facility], [place] [address], [place], [place] [address]     [name], [facility] [phone] pager   [phone] cell 12/28/2018, 4:19 PM

## 2018-12-29 ENCOUNTER — Ambulatory Visit: Payer: Medicaid Other | Admitting: Gastroenterology

## 2018-12-29 ENCOUNTER — Encounter (HOSPITAL_COMMUNITY): Payer: Self-pay | Admitting: Interventional Radiology

## 2018-12-29 ENCOUNTER — Inpatient Hospital Stay (HOSPITAL_COMMUNITY): Payer: Medicaid Other

## 2018-12-29 DIAGNOSIS — B961 Klebsiella pneumoniae [K. pneumoniae] as the cause of diseases classified elsewhere: Secondary | ICD-10-CM

## 2018-12-29 LAB — GLUCOSE, CAPILLARY
Glucose-Capillary: 201 mg/dL — ABNORMAL HIGH (ref 70–99)
Glucose-Capillary: 189 mg/dL — ABNORMAL HIGH (ref 70–99)
Glucose-Capillary: 144 mg/dL — ABNORMAL HIGH (ref 70–99)
Glucose-Capillary: 65 mg/dL — ABNORMAL LOW (ref 70–99)
Glucose-Capillary: 129 mg/dL — ABNORMAL HIGH (ref 70–99)

## 2018-12-29 LAB — CBC
HCT: 31 % — ABNORMAL LOW (ref 39.0–52.0)
Hemoglobin: 10.5 g/dL — ABNORMAL LOW (ref 13.0–17.0)
MCH: 25.6 pg — ABNORMAL LOW (ref 26.0–34.0)
MCHC: 33.9 g/dL (ref 30.0–36.0)
MCV: 75.6 fL — ABNORMAL LOW (ref 80.0–100.0)
Platelets: 120 10*3/uL — ABNORMAL LOW (ref 150–400)
RBC: 4.1 MIL/uL — ABNORMAL LOW (ref 4.22–5.81)
RDW: 18.5 % — ABNORMAL HIGH (ref 11.5–15.5)
WBC: 21.4 10*3/uL — ABNORMAL HIGH (ref 4.0–10.5)
nRBC: 0.3 % — ABNORMAL HIGH (ref 0.0–0.2)

## 2018-12-29 LAB — BASIC METABOLIC PANEL
Anion gap: 14 (ref 5–15)
BUN: 80 mg/dL — ABNORMAL HIGH (ref 6–20)
CO2: 25 mmol/L (ref 22–32)
Calcium: 7.9 mg/dL — ABNORMAL LOW (ref 8.9–10.3)
Chloride: 86 mmol/L — ABNORMAL LOW (ref 98–111)
Creatinine, Ser: 3.83 mg/dL — ABNORMAL HIGH (ref 0.61–1.24)
GFR calc Af Amer: 19 mL/min — ABNORMAL LOW (ref >60)
GFR calc non Af Amer: 16 mL/min — ABNORMAL LOW (ref >60)
Glucose, Bld: 201 mg/dL — ABNORMAL HIGH (ref 70–99)
Potassium: 4.6 mmol/L (ref 3.5–5.1)
Sodium: 125 mmol/L — ABNORMAL LOW (ref 135–145)

## 2018-12-29 LAB — HEPATIC FUNCTION PANEL
ALT: 241 U/L — ABNORMAL HIGH (ref 0–44)
AST: 188 U/L — ABNORMAL HIGH (ref 15–41)
Albumin: 3 g/dL — ABNORMAL LOW (ref 3.5–5.0)
Alkaline Phosphatase: 110 U/L (ref 38–126)
Bilirubin, Direct: 15.6 mg/dL — ABNORMAL HIGH (ref 0.0–0.2)
Indirect Bilirubin: 11.3 mg/dL — ABNORMAL HIGH (ref 0.3–0.9)
Total Bilirubin: 26.9 mg/dL (ref 0.3–1.2)
Total Protein: 6.5 g/dL (ref 6.5–8.1)

## 2018-12-29 LAB — PROTIME-INR
INR: 4.1 (ref 0.8–1.2)
Prothrombin Time: 38.9 seconds — ABNORMAL HIGH (ref 11.4–15.2)

## 2018-12-29 LAB — FOLATE RBC
Folate, Hemolysate: 472 ng/mL
Folate, RBC: 1723 ng/mL (ref >498)
Hematocrit: 27.4 % — ABNORMAL LOW (ref 37.5–51.0)

## 2018-12-29 LAB — HEPATITIS B SURFACE ANTIGEN: Hepatitis B Surface Ag: NEGATIVE

## 2018-12-29 LAB — VITAMIN D 25 HYDROXY (VIT D DEFICIENCY, FRACTURES): Vit D, 25-Hydroxy: 30.1 ng/mL (ref 30.0–100.0)

## 2018-12-29 LAB — PATHOLOGIST SMEAR REVIEW

## 2018-12-29 LAB — VARICELLA ZOSTER ANTIBODY, IGG: Varicella IgG: >4000 index (ref >165)

## 2018-12-29 LAB — UREA NITROGEN, URINE: Urea Nitrogen, Ur: 145 mg/dL

## 2018-12-29 LAB — HIV ANTIBODY (ROUTINE TESTING W REFLEX): HIV Screen 4th Generation wRfx: NONREACTIVE

## 2018-12-29 MED ORDER — DEXTROSE 50 % IV SOLN
12.5000 g | INTRAVENOUS | Status: AC
  Administered 2018-12-29: 12.5 g via INTRAVENOUS
  Filled 2018-12-29: qty 50

## 2018-12-29 MED ORDER — ALBUMIN HUMAN 25 % IV SOLN: 50.0000 g | Freq: Once | INTRAVENOUS | Status: DC | PRN

## 2018-12-29 MED ORDER — LACTULOSE ENEMA
300.0000 mL | Freq: Two times a day (BID) | ORAL | Status: DC
  Administered 2018-12-29 – 2018-12-31 (×4): 300 mL via RECTAL
  Filled 2018-12-29 (×8): qty 300

## 2018-12-29 MED ORDER — ALBUMIN HUMAN 25 % IV SOLN: 50.0000 g | Freq: Once | INTRAVENOUS | Status: DC

## 2018-12-29 MED ORDER — SODIUM ZIRCONIUM CYCLOSILICATE 10 G PO PACK
10.0000 g | PACK | Freq: Every day | ORAL | Status: DC
  Administered 2018-12-31: 10 g via ORAL
  Filled 2018-12-29 (×2): qty 1

## 2018-12-29 MED ORDER — LACTULOSE 10 GM/15ML PO SOLN
30.0000 g | Freq: Four times a day (QID) | ORAL | Status: DC
  Administered 2018-12-29: 30 g via ORAL
  Filled 2018-12-29: qty 45

## 2018-12-29 MED ORDER — LACTULOSE ENEMA
300.0000 mL | Freq: Once | ORAL | Status: AC
  Administered 2018-12-29: 300 mL via RECTAL
  Filled 2018-12-29: qty 300

## 2018-12-29 NOTE — Progress Notes (Signed)
Hypoglycemic Event  CBG: 65  Treatment: D50 25 mL (12.5 gm)  Symptoms: None  Follow-up CBG: Time:2238 CBG Result:129  Possible Reasons for Event: Inadequate meal intake  Comments/MD notified:K. Baltazar Najjar, NP notified    Michael Hamilton

## 2018-12-29 NOTE — Progress Notes (Signed)
Patient ID: Michael Hamilton, male   DOB: 1960/07/25, 58 y.o.   MRN: [id number]    Progress Note   Subjective  Day # 5 Daughter at bedside.  Patient continues to complain of abdominal pain, and feels poorly in general.  No bowel movement in 3 to 4 days.  HD catheter placed yesterday .  Is making a small amount of urine, about 75 cc out this a.m. Continues meropenem/ Flagyl d/cd   Echo - EF >65, small pericardial effusion  Fluid culture from 6/5- gram neg rods in aerobic and anaerobic - ID still pending  Creat 3.8 this am - stable, urine outpt -  INR 4.8 yesterday- pend  Lab unable to add Cytology to fluid from tap 6/7- not enough    Objective   Vital signs in last 24 hours: Temp:  [97.6 F (36.4 C)-98.1 F (36.7 C)] 97.6 F (36.4 C) (06/09 0800) Pulse Rate:  [77-87] 87 (06/09 0800) Resp:  [13-30] 17 (06/09 0800) BP: (112-130)/(74-90) 129/86 (06/09 0800) SpO2:  [91 %-94 %] 93 % (06/09 0800) Weight:  [70.8 kg] 70.8 kg (06/09 0700) Last BM Date: 12/27/18(lactulose tonight) General:    vietnamese male  in NAD acute and chronically ill-appearing deeply jaundiced Heart:  Regular rate and rhythm; no murmurs Lungs: Respirations even and unlabored, lungs CTA bilaterally=-decreased at bases Abdomen: Distended with fairly tense ascites, diffuse tenderness without rebound, bowel sounds are present. Extremities: 1+ edema lower extremities Neurologic:  Alert and oriented, asterixis noted today Psych:  Cooperative. Normal mood and affect.  Intake/Output from previous day: 06/08 0701 - 06/09 0700 In: 2050 [I.V.:1800; IV Piggyback:250] Out: 625 [Urine:625] Intake/Output this shift: No intake/output data recorded.  Lab Results: Recent Labs    12/27/18 0602  WBC 31.2*  HGB 10.1*  HCT 31.7*  PLT 139*   BMET Recent Labs    12/28/18 0250 12/28/18 0911 12/29/18 0223  NA 121* 123* 125*  K 5.9* 5.9* 4.6  CL 87* 86* 86*  CO2 21* 24 25  GLUCOSE 365* 383* 201*  BUN 67* 70* 80*    CREATININE 3.82* 3.89* 3.83*  CALCIUM 7.6* 7.8* 7.9*   LFT Recent Labs    12/28/18 0250  PROT 5.9*  ALBUMIN 3.1*   3.1*  AST 212*  ALT 266*  ALKPHOS 96  BILITOT 22.9*  BILIDIR 16.6*  IBILI 6.3*   PT/INR Recent Labs    12/27/18 0602 12/28/18 1225  LABPROT 38.7* 44.3*  INR 4.1* 4.8*    Studies/Results: US Renal  Result Date: 12/27/2018 CLINICAL DATA:  Acute renal insufficiency. EXAM: RENAL / URINARY TRACT ULTRASOUND COMPLETE COMPARISON:  CT 01/06/2019 FINDINGS: Right Kidney: Renal measurements: 10.3 x 4.9 x 5.4 cm = volume: 143 mL. Mild increased cortical echogenicity. No mass or hydronephrosis visualized. Left Kidney: Renal measurements: 10.9 x 4.3 x 6.0 cm = volume: 146 mL. Mild increased cortical echogenicity. No mass or hydronephrosis visualized. Bladder: Appears normal for degree of bladder distention. Incidental finding of known ascites with a few septations. IMPRESSION: Normal size kidneys without hydronephrosis. Mild increased cortical echogenicity which can be seen with medical renal disease. Electronically Signed   By: Marin Olp M.D.   On: 12/27/2018 16:08   US Paracentesis  Result Date: 12/28/2018 INDICATION: SBP; Ascites EXAM: ULTRASOUND-GUIDED PARACENTESIS COMPARISON:  Previous paracentesis. MEDICATIONS: 10 cc 1% lidocaine COMPLICATIONS: None immediate. TECHNIQUE: Informed written consent was obtained from the patient after a discussion of the risks, benefits and alternatives to treatment. A timeout was performed prior to the initiation  of the procedure. Initial ultrasound scanning demonstrates a large amount of ascites within the right lower abdominal quadrant. The right lower abdomen was prepped and draped in the usual sterile fashion. 1% lidocaine with epinephrine was used for local anesthesia. Under direct ultrasound guidance, a 19 gauge, 7-cm, Yueh catheter was introduced. An ultrasound image was saved for documentation purposed. The paracentesis was performed. The  catheter was removed and a dressing was applied. The patient tolerated the procedure well without immediate post procedural complication. FINDINGS: A total of approximately 60 cc of bright orange/yellow fluid was removed. Samples were sent to the laboratory as requested by the clinical team. Only 60 cc drawn-- just for labs per MD.  INR 4.1 today IMPRESSION: Successful ultrasound-guided paracentesis yielding 60 cc of peritoneal fluid. Read by Lavonia Drafts [facility] Electronically Signed   By: Markus Daft M.D.   On: 12/27/2018 12:29   Ir Fluoro Guide Cv Line Right  Result Date: 12/29/2018 CLINICAL DATA:  Acute kidney injury, hepatic failure and need for non tunneled hemodialysis catheter for potential emergent hemodialysis. EXAM: NON-TUNNELED CENTRAL VENOUS CATHETER PLACEMENT WITH ULTRASOUND AND FLUOROSCOPIC GUIDANCE FLUOROSCOPY TIME:  36 seconds.  5.7 mGy. PROCEDURE: The procedure, risks, benefits, and alternatives were explained to the patient's daughter. Questions regarding the procedure were encouraged and answered. The patient's daughter understands and consents to the procedure. A time-out was performed prior to initiating the procedure. Ultrasound was used to confirm patency of the right internal jugular vein. The right neck and chest were prepped with chlorhexidine in a sterile fashion, and a sterile drape was applied covering the operative field. Maximum barrier sterile technique with sterile gowns and gloves were used for the procedure. Local anesthesia was provided with 1% lidocaine. After creating a small venotomy incision, a 21 gauge needle was advanced into the right internal jugular vein under direct, real-time ultrasound guidance. Dilatation was performed over a guidewire. A 13 French, triple lumen Trialysis catheter measuring 15 cm was then advanced over the wire. Final catheter positioning was confirmed and documented with a fluoroscopic spot image. The catheter was aspirated, flushed with saline,  and injected with appropriate volume heparin dwells. The catheter exit site was secured with 0-Prolene retention sutures. COMPLICATIONS: None.  No pneumothorax. FINDINGS: After catheter placement, the tip lies at the cavoatrial junction. The catheter aspirates normally and is ready for immediate use. IMPRESSION: Placement of non-tunneled central venous hemodialysis catheter via the right internal jugular vein. The catheter tip lies at the cavoatrial junction. The catheter is ready for immediate use. Electronically Signed   By: Aletta Edouard M.D.   On: 12/29/2018 08:27   Ir US Guide Vasc Access Right  Result Date: 12/29/2018 CLINICAL DATA:  Acute kidney injury, hepatic failure and need for non tunneled hemodialysis catheter for potential emergent hemodialysis. EXAM: NON-TUNNELED CENTRAL VENOUS CATHETER PLACEMENT WITH ULTRASOUND AND FLUOROSCOPIC GUIDANCE FLUOROSCOPY TIME:  36 seconds.  5.7 mGy. PROCEDURE: The procedure, risks, benefits, and alternatives were explained to the patient's daughter. Questions regarding the procedure were encouraged and answered. The patient's daughter understands and consents to the procedure. A time-out was performed prior to initiating the procedure. Ultrasound was used to confirm patency of the right internal jugular vein. The right neck and chest were prepped with chlorhexidine in a sterile fashion, and a sterile drape was applied covering the operative field. Maximum barrier sterile technique with sterile gowns and gloves were used for the procedure. Local anesthesia was provided with 1% lidocaine. After creating a small venotomy incision,  a 21 gauge needle was advanced into the right internal jugular vein under direct, real-time ultrasound guidance. Dilatation was performed over a guidewire. A 13 French, triple lumen Trialysis catheter measuring 15 cm was then advanced over the wire. Final catheter positioning was confirmed and documented with a fluoroscopic spot image. The  catheter was aspirated, flushed with saline, and injected with appropriate volume heparin dwells. The catheter exit site was secured with 0-Prolene retention sutures. COMPLICATIONS: None.  No pneumothorax. FINDINGS: After catheter placement, the tip lies at the cavoatrial junction. The catheter aspirates normally and is ready for immediate use. IMPRESSION: Placement of non-tunneled central venous hemodialysis catheter via the right internal jugular vein. The catheter tip lies at the cavoatrial junction. The catheter is ready for immediate use. Electronically Signed   By: Aletta Edouard M.D.   On: 12/29/2018 08:27   US Abdomen Limited Ruq  Result Date: 12/27/2018 CLINICAL DATA:  Elevated LFTs.  History of cirrhosis. EXAM: ULTRASOUND ABDOMEN LIMITED RIGHT UPPER QUADRANT COMPARISON:  CT abdomen pelvis dated December 25, 2018. Right upper quadrant ultrasound dated August 25, 2018. FINDINGS: Gallbladder: Sludge without gallstones. Mild gallbladder wall thickening is unchanged and likely related to ascites and underlying liver disease. No sonographic Murphy sign noted by sonographer. Common bile duct: Diameter: 3 mm, normal. Liver: No focal lesion identified. Unchanged nodular contour with coarsened parenchymal echogenicity. Portal vein is patent on color Doppler imaging with normal direction of blood flow towards the liver. IMPRESSION: 1. No biliary dilatation. 2. Gallbladder sludge. 3. Unchanged cirrhosis and ascites. Electronically Signed   By: Titus Dubin M.D.   On: 12/27/2018 14:16       Assessment / Plan:    #32 58 year-old [place] male with chronic hepatitis B, on entecavir as outpatient with recently diagnosed autoimmune hepatitis and severely decompensated cirrhosis.  Patient admitted with SBP.  ID consult yesterday-patient with polymicrobial bacterial peritonitis-final culture results pending Continued on meropenem  Patient with persistent abdominal pain and some increased  distention/ascites  Will consider 1 to 2 L paracentesis for comfort-holding until today's labs reviewed  #2 Acute kidney injury versus HRS-renal has been following, patient continues on albumin, octreotide and Midodrine Remains oliguric, creatinine stable  #3 severe coagulopathy-she was given 10 mg of vitamin K IV yesterday and repeat today #4 anemia multifactoral - stable #5 encephalopathy-patient had only been on as needed lactulose.  Orders placed for high-dose lactulose today then titrate to achieve 4 bowel movements per day.  Patient remains critically ill with poor prognosis.  Hopefully he will be able to obtain emergency Medicaid which is pursued this week and would allow him to be transferred to [facility] for possible liver transplant. Patient's family has been at bedside daily and are aware of his critical status          Principal Problem:   SBP (spontaneous bacterial peritonitis) ([place]) Active Problems:   Diabetes mellitus without complication ([place])   Essential hypertension   Autoimmune hepatitis ([place])   Microcytic anemia   Paraproteinemia   Hyponatremia   Cirrhosis of liver (HCC)   Acute kidney injury (HCC)   Thrombocytopenia (HCC)   Esophageal varices (HCC)   Hepatitis C antibody test positive   Hepatitis B core antibody positive     LOS: 3 days   Kenia Teagarden  12/29/2018, 8:51 AM

## 2018-12-29 NOTE — Progress Notes (Signed)
Lactose enema given. Patient with a lot of discomfort after enema. He was able to hold enema in for 10 min. Small amount of unformed stool returned.

## 2018-12-29 NOTE — Progress Notes (Signed)
PROGRESS NOTE    Michael Hamilton  DQQ:[id number] DOB: [birth year] DOA: 01/19/2019 PCP: [name], MD    Brief Narrative:  58 year old male who presented with worsening abdominal pain and ascites.  He does have history of autoimmune hepatitis, liver cirrhosis, thrombocytopenia, type 2 diabetes mellitus and hypertension.  Patient reported worsening abdominal pain for about 7 days, associated with increased abdominal girth, nausea, low-grade fever and orthopnea.  On his initial physical examination his blood pressure was 117/79, respiratory rate 18, pulse rate 102, temperature 99.4, oxygen saturation 99% on room air.  His lungs were clear to auscultation bilaterally, heart S1-S2 present and rhythmic, his abdomen was soft, tender to palpation, voluntary guarding.  No lower extremity edema.  Sodium 122, potassium 4.5, chloride 90, bicarb 22, glucose 287, BUN 24, creatinine 1.24, white count 26.9, hemoglobin 9.7, hematocrit 28.8, platelets 75, NR 2.0.  SARS COVID-19 negative.  Urinalysis negative for infection.  CT of the abdomen with cirrhosis, associated splenomegaly, large abdominal and pelvic ascites, trace bilateral pleural effusions.  Chest radiograph hypoinflated, no infiltrates.  EKG 97 bpm, right axis deviation, normal intervals, sinus rhythm, no ST segment or T wave changes.  Patient was admitted to the hospital for working diagnosis of spontaneous bacterial peritonitis complicated by hyponatremia and sepsis.    Assessment & Plan:   Principal Problem:   SBP (spontaneous bacterial peritonitis) ([place]) Active Problems:   Diabetes mellitus without complication (HCC)   Essential hypertension   Autoimmune hepatitis ([place])   Microcytic anemia   Paraproteinemia   Hyponatremia   Cirrhosis of liver (HCC)   Acute kidney injury (HCC)   Thrombocytopenia (HCC)   Esophageal varices (HCC)   Hepatitis C antibody test positive   Hepatitis B core antibody positive  1. Acute on chronic liver  failure in the setting of acute spontaneous bacterial peritonitis/ sepsis (present on admission). Patient this am is somnolent but not confused or agitated, no asterixis, his INR is 4.8. Has diagnosis of autoimmune hepatitis, treated with prednisone and entecavir (to prevent reactivation of Hep B). Ascitic fluid culture positive for Klebsiella pneumonia, blood cultures are no growth. Wbc at 31.2 . Steroids have been discontinued. Trending down AST  212, ALT 266, stable T Bilirubin at 22,9  Will continue antibiotic therapy with IV meropenem. Follow on cell count,  cultures and temperature curve. His abdomen is very distended and tympanic to percussion, will order flat abdomen films to rule out ileus. Last paracentesis 06/05 2.7 L. Continue lactulose therapy. Possible transfer to liver transplant center if worsening liver failure and pending Medicaid approval.  2. AKI due to hepatorenal syndrome, complicated with hyperkalemia and hyponatremia . No signs of worsening volume overload, urine output 625 over last 24 H, serum cr stable over last 24 H at 3,8.  K down to 4,6 from 5,9, serum bicarbonate at 25. Anion gap is 14. HD catheter has been placed on the right IJ vein. Stable blood pressure with systolic at 194 mmHg. On sodium zirconium 10 grams tid. Continue with octeotride and midodrine. To consider discontinue bicarb infusion. Avoid hypotension and nephrotoxic medications, will follow on renal panel in am.   3. T2DM. Fasting glucose is 201, will continue insulin sliding scale for glucose cover and monitoring, continue basal insulin with 10 units of insulin glargine.     DVT prophylaxis: scd   Code Status: full Family Communication: I spoke with patient's daughter at the bedside and all questions were addressed.  Disposition Plan/ discharge barriers: Pending clinical improvement   Body  mass index is 25.97 kg/m. Malnutrition Type:  Nutrition Problem: Increased nutrient needs Etiology: chronic  illness   Malnutrition Characteristics:  Signs/Symptoms: estimated needs   Nutrition Interventions:  Interventions: Boost Breeze, Prostat  RN Pressure Injury Documentation:     Consultants:   GI   ID   IR   Procedures:   Paracentesis  HD catheter right IJ   Antimicrobials:   Meropenem IV     Subjective: Patient is somnolent this am, most information from his daughter at the bedside, positive abdominal distention with dyspnea and pain, poor oral intake and poor appetite. No chest pain, no vomiting.   Objective: Vitals:   12/29/18 0200 12/29/18 0400 12/29/18 0600 12/29/18 0700  BP: 112/74 128/81 128/87   Pulse: 87 84 84   Resp: 19 13 16    Temp:  97.7 F (36.5 C)    TempSrc:  Oral    SpO2: 94% 92% 91%   Weight:    70.8 kg  Height:        Intake/Output Summary (Last 24 hours) at 12/29/2018 0809 Last data filed at 12/29/2018 0615 Gross per 24 hour  Intake 2050 ml  Output 400 ml  Net 1650 ml   Filed Weights   12/27/18 1155 12/27/18 1710 12/29/18 0700  Weight: 65.6 kg 65.6 kg 70.8 kg    Examination:   General: deconditioned and ill looking appearing  Neurology: somnolent but non focal, no asterixis E ENT: mild pallor, positive icterus, oral mucosa moist Cardiovascular: No JVD. S1-S2 present, rhythmic, no gallops, rubs, or murmurs. ++ non pitting lower extremity edema. Pulmonary: positive breath sounds bilaterally, adequate air movement, no wheezing, rhonchi or rales. Gastrointestinal. Abdomen very distended and tense, tympanic to percussion, with no organomegaly, no rebound or guarding Skin. No rashes Musculoskeletal: no joint deformities     Data Reviewed: I have personally reviewed following labs and imaging studies  CBC: Recent Labs  Lab 01/03/2019 0643 12/26/18 0718 12/27/18 0602  WBC 26.9* 37.8* 31.2*  NEUTROABS 23.0*  --   --   HGB 9.7* 9.7* 10.1*  HCT 28.8* 30.0* 31.7*  MCV 76.2* 77.7* 77.3*  PLT 75* 127* 765*   Basic Metabolic  Panel: Recent Labs  Lab 12/27/18 0602 12/27/18 1659 12/28/18 0250 12/28/18 0911 12/29/18 0223  NA 123* 124* 121* 123* 125*  K 6.4* 5.6* 5.9* 5.9* 4.6  CL 89* 86* 87* 86* 86*  CO2 21* 24 21* 24 25  GLUCOSE 163* 467* 365* 383* 201*  BUN 52* 62* 67* 70* 80*  CREATININE 3.31* 3.61* 3.82* 3.89* 3.83*  CALCIUM 8.2* 7.8* 7.6* 7.8* 7.9*  PHOS  --   --  7.0*  --   --    GFR: Estimated Creatinine Clearance: 18.1 mL/min (A) (by C-G formula based on SCr of 3.83 mg/dL (H)). Liver Function Tests: Recent Labs  Lab 01/19/2019 0643 01/15/2019 1539 12/27/18 0602 12/28/18 0250  AST 27  --  395* 212*  ALT 29  --  425* 266*  ALKPHOS 53  --  102 96  BILITOT 9.5* 8.4* 22.5* 22.9*  PROT 7.1  --  6.3* 5.9*  ALBUMIN 2.1*  --  2.9* 3.1*  3.1*   Recent Labs  Lab 01/06/2019 0643  LIPASE 38   Recent Labs  Lab 12/27/18 1946  AMMONIA 71*   Coagulation Profile: Recent Labs  Lab 01/19/2019 0643 12/27/18 0602 12/28/18 1225  INR 2.0* 4.1* 4.8*   Cardiac Enzymes: Recent Labs  Lab 01/13/2019 0644  TROPONINI <0.03   BNP (last  3 results) No results for input(s): PROBNP in the last 8760 hours. HbA1C: No results for input(s): HGBA1C in the last 72 hours. CBG: Recent Labs  Lab 12/28/18 0754 12/28/18 1057 12/28/18 1815 12/28/18 2151 12/29/18 0738  GLUCAP 385* 355* 241* 209* 201*   Lipid Profile: No results for input(s): CHOL, HDL, LDLCALC, TRIG, CHOLHDL, LDLDIRECT in the last 72 hours. Thyroid Function Tests: No results for input(s): TSH, T4TOTAL, FREET4, T3FREE, THYROIDAB in the last 72 hours. Anemia Panel: Recent Labs    12/28/18 0911  VITAMINB12 2,203*  FERRITIN 907*  TIBC NOT CALCULATED  IRON 51      Radiology Studies: I have reviewed all of the imaging during this hospital visit personally     Scheduled Meds: . Chlorhexidine Gluconate Cloth  6 each Topical Daily  . [START ON 01-23-19] entecavir  0.5 mg Oral Q72H  . feeding supplement  1 Container Oral BID BM  .  feeding supplement (PRO-STAT SUGAR FREE 64)  30 mL Oral BID  . insulin aspart  0-9 Units Subcutaneous TID WC  . insulin aspart  4 Units Subcutaneous TID WC  . insulin glargine  10 Units Subcutaneous Q2200  . midodrine  10 mg Oral TID  . pantoprazole  40 mg Oral Daily  . sodium chloride flush  10-40 mL Intracatheter Q12H  . sodium zirconium cyclosilicate  10 g Oral TID   Continuous Infusions: . meropenem (MERREM) IV Stopped (12/29/18 0615)  . octreotide  (SANDOSTATIN)    IV infusion 50 mcg/hr (12/29/18 0302)  . phytonadione (VITAMIN K) IV    .  sodium bicarbonate  infusion 1000 mL 50 mL/hr at 12/28/18 2224     LOS: 3 days        Maeby Vankleeck Gerome Apley, MD

## 2018-12-29 NOTE — Progress Notes (Signed)
[facility] Progress Note  Subjective: family interprets, no new c/o, I/O 625 cc uop and creat stable today, K+ better  Vitals:   12/29/18 0700 12/29/18 0800 12/29/18 1040 12/29/18 1100  BP:  129/86    Pulse:  87 92 90  Resp:  17 15 12   Temp:  97.6 F (36.4 C)    TempSrc:  Oral    SpO2:  93% 93% 91%  Weight: 70.8 kg     Height:        Inpatient medications: . Chlorhexidine Gluconate Cloth  6 each Topical Daily  . [START ON 01-23-2019] entecavir  0.5 mg Oral Q72H  . feeding supplement  1 Container Oral BID BM  . feeding supplement (PRO-STAT SUGAR FREE 64)  30 mL Oral BID  . insulin aspart  0-9 Units Subcutaneous TID WC  . insulin aspart  4 Units Subcutaneous TID WC  . insulin glargine  10 Units Subcutaneous Q2200  . lactulose  30 g Oral QID  . midodrine  10 mg Oral TID  . pantoprazole  40 mg Oral Daily  . sodium chloride flush  10-40 mL Intracatheter Q12H  . sodium zirconium cyclosilicate  10 g Oral TID   . meropenem (MERREM) IV Stopped (12/29/18 0615)  . octreotide  (SANDOSTATIN)    IV infusion 50 mcg/hr (12/29/18 0302)   acetaminophen **OR** acetaminophen, HYDROmorphone (DILAUDID) injection, morphine, ondansetron **OR** ondansetron (ZOFRAN) IV, sodium chloride flush    Exam: Alert, no oxgyen, calm   no jvd Chest cta bilat  Cor reg no RG  Abd 2-3+ ascites, nontender  Ext no sig edema  NF, ox 3   UA neg  UNa 6/7 <10  UCr 151  CXR 6/5 - low vol xray, +atx  Renal US > IMPRESSION: Normal size kidneys without hydronephrosis. Mild increased cortical echogenicity which can be seen with medical renal disease   Assessment/Plan: 1.  Acute kidney injury: HRS more likely and/or ATN. Cont trial IV albumin/ octreotide/ midodrine 10 tid, seems to be helping w/ ^'d BP's and UOP, stable creat. Temp HD cath in place.  No need for HD today, cont supportive care 2.  Hyperkalemia: better, renal diet/ Lokelma, avoid lactated Ringer's 3.  Vol excess: have dc'd IVF's.  Abd tight, seen by GI they are considering low volume paracentesis for comfort 1-2 L.  Agree w/ limiting volume of paracentesis in setting of hepatorenal syndrome.  3.  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  Attempting to transfer for liver transplantation but insurance is an issue. 4.  Anemia of chronic disease: Check iron studies and decide on need for replacement versus ESA. 5.  Hyponatremia: not grossly overloaded, +ascites is significant though. Na+ better on fluid restriction 800 cc/ day.    [facility] 12/29/2018, 11:47 AM  Iron/TIBC/Ferritin/ %Sat    Component Value Date/Time   IRON 51 12/28/2018 0911   IRON 110 08/04/2018 0947   TIBC NOT CALCULATED 12/28/2018 0911   TIBC 225 (L) 08/04/2018 0947   FERRITIN 907 (H) 12/28/2018 0911   IRONPCTSAT NOT CALCULATED 12/28/2018 0911   IRONPCTSAT 49 08/04/2018 0947   Recent Labs  Lab 12/28/18 0250  12/29/18 0223 12/29/18 0940  NA 121*   < > 125*  --   K 5.9*   < > 4.6  --   CL 87*   < > 86*  --   CO2 21*   < > 25  --  GLUCOSE 365*   < > 201*  --   BUN 67*   < > 80*  --   CREATININE 3.82*   < > 3.83*  --   CALCIUM 7.6*   < > 7.9*  --   PHOS 7.0*  --   --   --   ALBUMIN 3.1*  3.1*  --   --   --   INR  --    < >  --  4.1*   < > = values in this interval not displayed.   Recent Labs  Lab 12/28/18 0250  AST 212*  ALT 266*  ALKPHOS 96  BILITOT 22.9*  PROT 5.9*   Recent Labs  Lab 12/29/18 0940  WBC 21.4*  HGB 10.5*  HCT 31.0*  PLT 120*

## 2018-12-29 NOTE — Progress Notes (Signed)
Lactulose enema given, patient only able to tolerate instilment of 400cc.He was able to hold enema for about 75min. Medium amount of loose yellow mucous returned.

## 2018-12-29 NOTE — Progress Notes (Signed)
Patient ID: Michael Hamilton, male   DOB: Jul 13, 1961, 58 y.o.   MRN: [id number]         [facility] for Infectious Disease  Date of Admission:  01/14/2019    Total days of antibiotics 15        Day 3 meropenem         ASSESSMENT: He is growing Klebsiella and probably an anaerobe.  I will continue meropenem for now.  PLAN: 1. Continue meropenem pending further observation and final culture results  Principal Problem:   SBP (spontaneous bacterial peritonitis) ([place]) Active Problems:   Acute kidney injury ([place])   Autoimmune hepatitis ([place])   Cirrhosis of liver (HCC)   Hepatitis C antibody test positive   Hepatitis B core antibody positive   Diabetes mellitus without complication ([place])   Essential hypertension   Microcytic anemia   Paraproteinemia   Hyponatremia   Thrombocytopenia (HCC)   Esophageal varices (HCC)   Scheduled Meds:  Chlorhexidine Gluconate Cloth  6 each Topical Daily   [START ON 01/11/2019] entecavir  0.5 mg Oral Q72H   feeding supplement  1 Container Oral BID BM   feeding supplement (PRO-STAT SUGAR FREE 64)  30 mL Oral BID   insulin aspart  0-9 Units Subcutaneous TID WC   insulin aspart  4 Units Subcutaneous TID WC   insulin glargine  10 Units Subcutaneous Q2200   lactulose  300 mL Rectal BID   midodrine  10 mg Oral TID   pantoprazole  40 mg Oral Daily   sodium chloride flush  10-40 mL Intracatheter Q12H   [START ON 12/30/2018] sodium zirconium cyclosilicate  10 g Oral Daily   Continuous Infusions:  albumin human     meropenem (MERREM) IV Stopped (12/29/18 1642)   octreotide  (SANDOSTATIN)    IV infusion 50 mcg/hr (12/29/18 1325)   PRN Meds:.acetaminophen **OR** acetaminophen, albumin human, HYDROmorphone (DILAUDID) injection, morphine, ondansetron **OR** ondansetron (ZOFRAN) IV, sodium chloride flush   SUBJECTIVE: His daughter states that it seems like his pain comes and goes today.  She says that he definitely rests more comfortably  after receiving Dilaudid.  Review of Systems: Review of Systems  Unable to perform ROS: Language    No Known Allergies  OBJECTIVE: Vitals:   12/29/18 1200 12/29/18 1326 12/29/18 1400 12/29/18 1600  BP:  136/79 119/71 123/85  Pulse:  88  94  Resp:  14 (!) 22 16  Temp: 97.6 F (36.4 C)     TempSrc: Oral     SpO2:  92%  90%  Weight:      Height:       Body mass index is 25.97 kg/m.  Physical Exam Constitutional:      Comments: He is resting quietly in bed.  Abdominal:     General: There is distension.     Tenderness: There is abdominal tenderness.     Lab Results Lab Results  Component Value Date   WBC 21.4 (H) 12/29/2018   HGB 10.5 (L) 12/29/2018   HCT 31.0 (L) 12/29/2018   MCV 75.6 (L) 12/29/2018   PLT 120 (L) 12/29/2018    Lab Results  Component Value Date   CREATININE 3.83 (H) 12/29/2018   BUN 80 (H) 12/29/2018   NA 125 (L) 12/29/2018   K 4.6 12/29/2018   CL 86 (L) 12/29/2018   CO2 25 12/29/2018    Lab Results  Component Value Date   ALT 241 (H) 12/29/2018   AST 188 (H) 12/29/2018   ALKPHOS  110 12/29/2018   BILITOT 26.9 ([place]) 12/29/2018     Microbiology: Recent Results (from the past 497 hour(s))  Helicobacter pylori special antigen     Status: None   Collection Time: 12/24/18  2:23 PM  Result Value Ref Range Status   MICRO NUMBER: [id number]  Final   SPECIMEN QUALITY Adequate  Final   SOURCE: STOOL  Final   STATUS: FINAL  Final   RESULT:   Final    Not Detected  Antimicrobials, proton pump inhibitors, and bismuth preparations inhibit H. pylori and ingestion up to two weeks prior to testing may cause false negative results. If clinically indicated the test should be repeated on a new specimen  obtained two weeks after discontinuing treatment.   SARS Coronavirus 2 (CEPHEID - Performed in [place] hospital lab), Hosp Order     Status: None   Collection Time: 12/26/2018  6:42 AM  Result Value Ref Range Status   SARS Coronavirus 2 NEGATIVE  NEGATIVE Final    Comment: (NOTE) If result is NEGATIVE SARS-CoV-2 target nucleic acids are NOT DETECTED. The SARS-CoV-2 RNA is generally detectable in upper and lower  respiratory specimens during the acute phase of infection. The lowest  concentration of SARS-CoV-2 viral copies this assay can detect is 250  copies / mL. A negative result does not preclude SARS-CoV-2 infection  and should not be used as the sole basis for treatment or other  patient management decisions.  A negative result may occur with  improper specimen collection / handling, submission of specimen other  than nasopharyngeal swab, presence of viral mutation(s) within the  areas targeted by this assay, and inadequate number of viral copies  (<250 copies / mL). A negative result must be combined with clinical  observations, patient history, and epidemiological information. If result is POSITIVE SARS-CoV-2 target nucleic acids are DETECTED. The SARS-CoV-2 RNA is generally detectable in upper and lower  respiratory specimens dur ing the acute phase of infection.  Positive  results are indicative of active infection with SARS-CoV-2.  Clinical  correlation with patient history and other diagnostic information is  necessary to determine patient infection status.  Positive results do  not rule out bacterial infection or co-infection with other viruses. If result is PRESUMPTIVE POSTIVE SARS-CoV-2 nucleic acids MAY BE PRESENT.   A presumptive positive result was obtained on the submitted specimen  and confirmed on repeat testing.  While 2019 novel coronavirus  (SARS-CoV-2) nucleic acids may be present in the submitted sample  additional confirmatory testing may be necessary for epidemiological  and / or clinical management purposes  to differentiate between  SARS-CoV-2 and other Sarbecovirus currently known to infect humans.  If clinically indicated additional testing with an alternate test  methodology [phone]) is  advised. The SARS-CoV-2 RNA is generally  detectable in upper and lower respiratory sp ecimens during the acute  phase of infection. The expected result is Negative. Fact Sheet for Patients:  StrictlyIdeas.no Fact Sheet for Healthcare Providers: BankingDealers.co.za This test is not yet approved or cleared by the [place] FDA and has been authorized for detection and/or diagnosis of SARS-CoV-2 by FDA under an Emergency Use Authorization (EUA).  This EUA will remain in effect (meaning this test can be used) for the duration of the COVID-19 declaration under Section 564(b)(1) of the Act, 21 U.S.C. section 360bbb-3(b)(1), unless the authorization is terminated or revoked sooner. Performed at [facility], [place] [address] [place], [place] [address]   Culture, blood (routine x  2)     Status: None (Preliminary result)   Collection Time: 01/11/2019  7:40 AM  Result Value Ref Range Status   Specimen Description   Final    BLOOD LEFT HAND Performed at [facility], [place] [address], [place], [place] [address]    Special Requests   Final    BOTTLES DRAWN AEROBIC AND ANAEROBIC Blood Culture adequate volume Performed at [place] [address] [place], [place] [address]    Culture   Final    NO GROWTH 4 DAYS Performed at [facility], [place] [address], [place], [place] [address]    Report Status PENDING  Incomplete  Culture, blood (routine x 2)     Status: None (Preliminary result)   Collection Time: 01/03/2019  7:50 AM  Result Value Ref Range Status   Specimen Description   Final    BLOOD BLOOD LEFT WRIST Performed at [place] [address], [place], [place] [address]    Special Requests   Final    BOTTLES DRAWN AEROBIC AND ANAEROBIC Blood Culture results may not be optimal due to an excessive volume of blood received in culture bottles Performed at [place] [address], [place], [place] [address]    Culture   Final    NO GROWTH 4 DAYS Performed at [facility], [place] [address] [place], [place] [address]    Report Status PENDING  Incomplete  Culture, body fluid-bottle     Status: Abnormal (Preliminary result)   Collection Time: 01/03/2019 12:44 PM  Result Value Ref Range Status   Specimen Description PERITONEAL  Final   Special Requests NONE  Final   Gram Stain   Final    GRAM NEGATIVE RODS IN BOTH AEROBIC AND ANAEROBIC BOTTLES CRITICAL RESULT CALLED TO, READ BACK BY AND VERIFIED WITH: JOHNSON AT 0710 ON [id number] BY SJW    Culture (A)  Final    KLEBSIELLA PNEUMONIAE CULTURE REINCUBATED FOR BETTER GROWTH Performed at [facility], [place] [address], [place], [place] [address]    Report Status PENDING  Incomplete   Organism ID, Bacteria KLEBSIELLA PNEUMONIAE  Final      Susceptibility   Klebsiella pneumoniae - MIC*    AMPICILLIN >=32 RESISTANT Resistant     CEFAZOLIN <=4 SENSITIVE Sensitive     CEFEPIME <=1 SENSITIVE Sensitive     CEFTAZIDIME <=1 SENSITIVE Sensitive     CEFTRIAXONE <=1 SENSITIVE Sensitive     CIPROFLOXACIN >=4 RESISTANT Resistant     GENTAMICIN >=16 RESISTANT Resistant     IMIPENEM <=0.25 SENSITIVE Sensitive     TRIMETH/SULFA >=320 RESISTANT Resistant     AMPICILLIN/SULBACTAM 16 INTERMEDIATE Intermediate     PIP/TAZO <=4 SENSITIVE Sensitive     Extended ESBL NEGATIVE Sensitive     * KLEBSIELLA PNEUMONIAE  Gram stain     Status: None   Collection Time: 01/17/2019 12:44 PM  Result Value Ref Range Status   Specimen Description PERITONEAL  Final   Special Requests NONE  Final   Gram Stain   Final    ABUNDANT WBC PRESENT,BOTH PMN AND MONONUCLEAR NO ORGANISMS SEEN Performed at [facility], [place] [address] [place], [place] [address]    Report Status 12/29/2018 FINAL  Final  Urine culture     Status: Abnormal   Collection Time: 01/14/2019  1:12 PM  Result Value Ref Range Status    Specimen Description   Final  URINE, RANDOM Performed at [facility], [place] [address], [place], [place] [address]    Special Requests   Final    NONE Performed at [facility], [place] [address], [place], [place] [address]    Culture MULTIPLE SPECIES PRESENT, SUGGEST RECOLLECTION (A)  Final   Report Status 12/27/2018 FINAL  Final  Culture, body fluid-bottle     Status: None (Preliminary result)   Collection Time: 12/27/18 12:25 PM  Result Value Ref Range Status   Specimen Description PERITONEAL FLUID  Final   Special Requests BOTTLES DRAWN AEROBIC AND ANAEROBIC  Final   Culture   Final    NO GROWTH 2 DAYS Performed at [facility], [place] [address], [place], [place] [address]    Report Status PENDING  Incomplete  Gram stain     Status: None   Collection Time: 12/27/18 12:25 PM  Result Value Ref Range Status   Specimen Description PERITONEAL FLUID  Final   Special Requests NONE  Final   Gram Stain   Final    WBC PRESENT, PREDOMINANTLY PMN NO ORGANISMS SEEN CYTOSPIN SMEAR Performed at [facility], [place] [address] [place], [place] [address]    Report Status 12/28/2018 FINAL  Final  MRSA PCR Screening     Status: None   Collection Time: 12/27/18  2:17 PM  Result Value Ref Range Status   MRSA by PCR NEGATIVE NEGATIVE Final    Comment:        The GeneXpert MRSA Assay (FDA approved for NASAL specimens only), is one component of a comprehensive MRSA colonization surveillance program. It is not intended to diagnose MRSA infection nor to guide or monitor treatment for MRSA infections. Performed at [facility], [place] [address], [place], [place] [address]     [name], [place] [facility] [phone] pager   [phone] cell 12/29/2018, 5:01 PM

## 2018-12-30 ENCOUNTER — Inpatient Hospital Stay (HOSPITAL_COMMUNITY): Payer: Medicaid Other

## 2018-12-30 ENCOUNTER — Inpatient Hospital Stay (HOSPITAL_COMMUNITY): Payer: Medicaid Other | Admitting: Certified Registered Nurse Anesthetist

## 2018-12-30 ENCOUNTER — Ambulatory Visit: Payer: Medicaid Other

## 2018-12-30 DIAGNOSIS — A4151 Sepsis due to Escherichia coli [E. coli]: Secondary | ICD-10-CM

## 2018-12-30 DIAGNOSIS — Z515 Encounter for palliative care: Secondary | ICD-10-CM

## 2018-12-30 DIAGNOSIS — J9601 Acute respiratory failure with hypoxia: Secondary | ICD-10-CM

## 2018-12-30 DIAGNOSIS — K652 Spontaneous bacterial peritonitis: Secondary | ICD-10-CM

## 2018-12-30 DIAGNOSIS — K56609 Unspecified intestinal obstruction, unspecified as to partial versus complete obstruction: Secondary | ICD-10-CM

## 2018-12-30 DIAGNOSIS — Z7189 Other specified counseling: Secondary | ICD-10-CM

## 2018-12-30 DIAGNOSIS — R579 Shock, unspecified: Secondary | ICD-10-CM

## 2018-12-30 DIAGNOSIS — K560 Paralytic ileus: Secondary | ICD-10-CM

## 2018-12-30 DIAGNOSIS — R6521 Severe sepsis with septic shock: Secondary | ICD-10-CM

## 2018-12-30 DIAGNOSIS — I8500 Esophageal varices without bleeding: Secondary | ICD-10-CM

## 2018-12-30 LAB — CBC WITH DIFFERENTIAL/PLATELET
Abs Immature Granulocytes: 0.75 10*3/uL — ABNORMAL HIGH (ref 0.00–0.07)
Basophils Absolute: 0.1 10*3/uL (ref 0.0–0.1)
Basophils Relative: 0 %
Eosinophils Absolute: 0.1 10*3/uL (ref 0.0–0.5)
Eosinophils Relative: 0 %
HCT: 35.4 % — ABNORMAL LOW (ref 39.0–52.0)
Hemoglobin: 11.8 g/dL — ABNORMAL LOW (ref 13.0–17.0)
Immature Granulocytes: 3 %
Lymphocytes Relative: 3 %
Lymphs Abs: 0.8 10*3/uL (ref 0.7–4.0)
MCH: 25.5 pg — ABNORMAL LOW (ref 26.0–34.0)
MCHC: 33.3 g/dL (ref 30.0–36.0)
MCV: 76.6 fL — ABNORMAL LOW (ref 80.0–100.0)
Monocytes Absolute: 2 10*3/uL — ABNORMAL HIGH (ref 0.1–1.0)
Monocytes Relative: 8 %
Neutro Abs: 23.4 10*3/uL — ABNORMAL HIGH (ref 1.7–7.7)
Neutrophils Relative %: 86 %
Platelets: 159 10*3/uL (ref 150–400)
RBC: 4.62 MIL/uL (ref 4.22–5.81)
RDW: 19.9 % — ABNORMAL HIGH (ref 11.5–15.5)
WBC: 27.3 10*3/uL — ABNORMAL HIGH (ref 4.0–10.5)
nRBC: 0.7 % — ABNORMAL HIGH (ref 0.0–0.2)

## 2018-12-30 LAB — COMPREHENSIVE METABOLIC PANEL
ALT: 235 U/L — ABNORMAL HIGH (ref 0–44)
AST: 297 U/L — ABNORMAL HIGH (ref 15–41)
Albumin: 2.9 g/dL — ABNORMAL LOW (ref 3.5–5.0)
Alkaline Phosphatase: 107 U/L (ref 38–126)
Anion gap: 20 — ABNORMAL HIGH (ref 5–15)
BUN: 97 mg/dL — ABNORMAL HIGH (ref 6–20)
CO2: 20 mmol/L — ABNORMAL LOW (ref 22–32)
Calcium: 8.9 mg/dL (ref 8.9–10.3)
Chloride: 88 mmol/L — ABNORMAL LOW (ref 98–111)
Creatinine, Ser: 5.09 mg/dL — ABNORMAL HIGH (ref 0.61–1.24)
GFR calc Af Amer: 13 mL/min — ABNORMAL LOW (ref >60)
GFR calc non Af Amer: 11 mL/min — ABNORMAL LOW (ref >60)
Glucose, Bld: 42 mg/dL — CL (ref 70–99)
Potassium: 4.8 mmol/L (ref 3.5–5.1)
Sodium: 128 mmol/L — ABNORMAL LOW (ref 135–145)
Total Bilirubin: 34.9 mg/dL (ref 0.3–1.2)
Total Protein: 6.7 g/dL (ref 6.5–8.1)

## 2018-12-30 LAB — CULTURE, BODY FLUID W GRAM STAIN -BOTTLE

## 2018-12-30 LAB — POCT I-STAT 7, (LYTES, BLD GAS, ICA,H+H)
Acid-base deficit: 9 mmol/L — ABNORMAL HIGH (ref 0.0–2.0)
Bicarbonate: 15.6 mmol/L — ABNORMAL LOW (ref 20.0–28.0)
Calcium, Ion: 0.97 mmol/L — ABNORMAL LOW (ref 1.15–1.40)
HCT: 41 % (ref 39.0–52.0)
Hemoglobin: 13.9 g/dL (ref 13.0–17.0)
O2 Saturation: 99 %
Patient temperature: 97
Potassium: 4.5 mmol/L (ref 3.5–5.1)
Sodium: 128 mmol/L — ABNORMAL LOW (ref 135–145)
TCO2: 16 mmol/L — ABNORMAL LOW (ref 22–32)
pCO2 arterial: 27.1 mmHg — ABNORMAL LOW (ref 32.0–48.0)
pH, Arterial: 7.363 (ref 7.350–7.450)
pO2, Arterial: 156 mmHg — ABNORMAL HIGH (ref 83.0–108.0)

## 2018-12-30 LAB — CALCIUM, IONIZED: Calcium, Ionized, Serum: 4 mg/dL — ABNORMAL LOW (ref 4.5–5.6)

## 2018-12-30 LAB — CULTURE, BLOOD (ROUTINE X 2)
Culture: NO GROWTH
Culture: NO GROWTH
Special Requests: ADEQUATE

## 2018-12-30 LAB — GLUCOSE, CAPILLARY
Glucose-Capillary: 180 mg/dL — ABNORMAL HIGH (ref 70–99)
Glucose-Capillary: 33 mg/dL — CL (ref 70–99)
Glucose-Capillary: 41 mg/dL — CL (ref 70–99)
Glucose-Capillary: 109 mg/dL — ABNORMAL HIGH (ref 70–99)
Glucose-Capillary: 74 mg/dL (ref 70–99)
Glucose-Capillary: 74 mg/dL (ref 70–99)
Glucose-Capillary: 81 mg/dL (ref 70–99)
Glucose-Capillary: 84 mg/dL (ref 70–99)
Glucose-Capillary: 53 mg/dL — ABNORMAL LOW (ref 70–99)
Glucose-Capillary: 130 mg/dL — ABNORMAL HIGH (ref 70–99)

## 2018-12-30 LAB — LACTIC ACID, PLASMA
Lactic Acid, Venous: 10.9 mmol/L (ref 0.5–1.9)
Lactic Acid, Venous: 10.4 mmol/L (ref 0.5–1.9)
Lactic Acid, Venous: >11 mmol/L (ref 0.5–1.9)

## 2018-12-30 LAB — AMMONIA: Ammonia: 52 umol/L — ABNORMAL HIGH (ref 9–35)

## 2018-12-30 LAB — HEPATITIS B DNA, ULTRAQUANTITATIVE, PCR
HBV DNA SERPL PCR-ACNC: NOT DETECTED IU/mL
HBV DNA SERPL PCR-LOG IU: UNDETERMINED log10 IU/mL

## 2018-12-30 LAB — FIBRINOGEN: Fibrinogen: 134 mg/dL — ABNORMAL LOW (ref 210–475)

## 2018-12-30 LAB — FACTOR 7 ASSAY: Factor VII Activity: 5 % — ABNORMAL LOW (ref 51–186)

## 2018-12-30 LAB — PROTIME-INR
INR: 5.9 (ref 0.8–1.2)
Prothrombin Time: 52 seconds — ABNORMAL HIGH (ref 11.4–15.2)

## 2018-12-30 MED ORDER — SODIUM CHLORIDE 0.9 % IV BOLUS
250.0000 mL | Freq: Once | INTRAVENOUS | Status: AC
  Administered 2018-12-30: 250 mL via INTRAVENOUS

## 2018-12-30 MED ORDER — VITAMIN K1 10 MG/ML IJ SOLN
10.0000 mg | Freq: Once | INTRAVENOUS | Status: AC
  Administered 2018-12-30: 10 mg via INTRAVENOUS
  Filled 2018-12-30: qty 1

## 2018-12-30 MED ORDER — ALBUMIN HUMAN 25 % IV SOLN
25.0000 g | Freq: Four times a day (QID) | INTRAVENOUS | Status: DC
  Administered 2018-12-30 – 2018-12-31 (×3): 25 g via INTRAVENOUS
  Filled 2018-12-30 (×2): qty 100
  Filled 2018-12-30 (×2): qty 50

## 2018-12-30 MED ORDER — SODIUM CHLORIDE 0.9 % IV SOLN
2.0000 g | INTRAVENOUS | Status: DC
  Filled 2018-12-30: qty 20

## 2018-12-30 MED ORDER — FENTANYL 2500MCG IN NS 250ML (10MCG/ML) PREMIX INFUSION
50.0000 ug/h | INTRAVENOUS | Status: DC
  Administered 2018-12-30: 50 ug/h via INTRAVENOUS
  Administered 2018-12-31 (×2): 200 ug/h via INTRAVENOUS
  Filled 2018-12-30 (×3): qty 250

## 2018-12-30 MED ORDER — DEXTROSE-NACL 5-0.9 % IV SOLN
INTRAVENOUS | Status: DC
  Administered 2018-12-30: 08:00:00 via INTRAVENOUS

## 2018-12-30 MED ORDER — FENTANYL CITRATE (PF) 100 MCG/2ML IJ SOLN
50.0000 ug | Freq: Once | INTRAMUSCULAR | Status: AC
  Administered 2018-12-30: 50 ug via INTRAVENOUS
  Filled 2018-12-30: qty 2

## 2018-12-30 MED ORDER — SODIUM CHLORIDE 0.9 % FOR CRRT
INTRAVENOUS_CENTRAL | Status: DC | PRN
  Administered 2018-12-30: 23:00:00 via INTRAVENOUS_CENTRAL
  Filled 2018-12-30: qty 1000

## 2018-12-30 MED ORDER — HEPARIN SODIUM (PORCINE) 1000 UNIT/ML DIALYSIS
1000.0000 [IU] | INTRAMUSCULAR | Status: DC | PRN
  Filled 2018-12-30: qty 6

## 2018-12-30 MED ORDER — PRISMASOL BGK 4/2.5 32-4-2.5 MEQ/L IV SOLN
INTRAVENOUS | Status: DC
  Administered 2018-12-30 – 2018-12-31 (×2): via INTRAVENOUS_CENTRAL
  Filled 2018-12-30 (×7): qty 5000

## 2018-12-30 MED ORDER — SODIUM CHLORIDE 0.9 % IV SOLN
INTRAVENOUS | Status: DC | PRN
  Administered 2018-12-30: 1000 mL via INTRAVENOUS

## 2018-12-30 MED ORDER — PROPOFOL 10 MG/ML IV BOLUS
INTRAVENOUS | Status: DC | PRN
  Administered 2018-12-30: 50 mg via INTRAVENOUS

## 2018-12-30 MED ORDER — SODIUM CHLORIDE 0.9 % IV SOLN
1.0000 g | Freq: Two times a day (BID) | INTRAVENOUS | Status: DC
  Filled 2018-12-30: qty 1

## 2018-12-30 MED ORDER — PRISMASOL BGK 4/2.5 32-4-2.5 MEQ/L REPLACEMENT SOLN
Status: DC
  Administered 2018-12-30: 23:00:00 via INTRAVENOUS_CENTRAL
  Filled 2018-12-30 (×2): qty 5000

## 2018-12-30 MED ORDER — CHLORHEXIDINE GLUCONATE 0.12% ORAL RINSE (MEDLINE KIT)
15.0000 mL | Freq: Two times a day (BID) | OROMUCOSAL | Status: DC
  Administered 2018-12-30 – 2018-12-31 (×2): 15 mL via OROMUCOSAL

## 2018-12-30 MED ORDER — VITAMIN K1 10 MG/ML IJ SOLN
5.0000 mg | Freq: Every day | INTRAVENOUS | Status: DC
  Administered 2018-12-30 – 2018-12-31 (×2): 5 mg via INTRAVENOUS
  Filled 2018-12-30 (×4): qty 0.5

## 2018-12-30 MED ORDER — SUCCINYLCHOLINE CHLORIDE 20 MG/ML IJ SOLN
INTRAMUSCULAR | Status: DC | PRN
  Administered 2018-12-30: 100 mg via INTRAVENOUS

## 2018-12-30 MED ORDER — VASOPRESSIN 20 UNIT/ML IV SOLN
0.0400 [IU]/min | INTRAVENOUS | Status: DC
  Administered 2018-12-30 – 2018-12-31 (×2): 0.04 [IU]/min via INTRAVENOUS
  Filled 2018-12-30 (×2): qty 2

## 2018-12-30 MED ORDER — NOREPINEPHRINE 4 MG/250ML-% IV SOLN: 0.0000 ug/min | INTRAVENOUS | Status: DC

## 2018-12-30 MED ORDER — HYDROCORTISONE NA SUCCINATE PF 100 MG IJ SOLR
50.0000 mg | Freq: Four times a day (QID) | INTRAMUSCULAR | Status: DC
  Administered 2018-12-30 – 2018-12-31 (×3): 50 mg via INTRAVENOUS
  Filled 2018-12-30 (×3): qty 2

## 2018-12-30 MED ORDER — PHENYLEPHRINE HCL (PRESSORS) 10 MG/ML IV SOLN
INTRAVENOUS | Status: DC | PRN
  Administered 2018-12-30 (×2): 120 ug via INTRAVENOUS

## 2018-12-30 MED ORDER — INSULIN ASPART 100 UNIT/ML ~~LOC~~ SOLN: 0.0000 [IU] | SUBCUTANEOUS | Status: DC

## 2018-12-30 MED ORDER — SODIUM CHLORIDE 0.9 % IV SOLN
500.0000 mg | INTRAVENOUS | Status: DC
  Administered 2018-12-30: 500 mg via INTRAVENOUS
  Filled 2018-12-30: qty 0.5

## 2018-12-30 MED ORDER — SODIUM CHLORIDE 0.9 % IV SOLN: INTRAVENOUS | Status: DC | PRN

## 2018-12-30 MED ORDER — SODIUM CHLORIDE 0.9 % IV SOLN
1.0000 g | Freq: Two times a day (BID) | INTRAVENOUS | Status: DC
  Administered 2018-12-31: 1 g via INTRAVENOUS
  Filled 2018-12-30 (×3): qty 1

## 2018-12-30 MED ORDER — DEXTROSE 50 % IV SOLN
INTRAVENOUS | Status: AC
  Administered 2018-12-30: 50 mL
  Filled 2018-12-30: qty 50

## 2018-12-30 MED ORDER — DEXTROSE 50 % IV SOLN
25.0000 g | INTRAVENOUS | Status: AC
  Administered 2018-12-30: 25 g via INTRAVENOUS

## 2018-12-30 MED ORDER — FENTANYL BOLUS VIA INFUSION
50.0000 ug | INTRAVENOUS | Status: DC | PRN
  Administered 2018-12-30 – 2018-12-31 (×9): 50 ug via INTRAVENOUS
  Filled 2018-12-30: qty 50

## 2018-12-30 MED ORDER — ZINC SULFATE 220 (50 ZN) MG PO CAPS
220.0000 mg | ORAL_CAPSULE | Freq: Every day | ORAL | Status: DC
  Administered 2018-12-31: 220 mg via ORAL
  Filled 2018-12-30: qty 1

## 2018-12-30 MED ORDER — CHLORHEXIDINE GLUCONATE CLOTH 2 % EX PADS
6.0000 | MEDICATED_PAD | Freq: Every day | CUTANEOUS | Status: DC
  Administered 2018-12-30 (×2): 6 via TOPICAL

## 2018-12-30 MED ORDER — ORAL CARE MOUTH RINSE
15.0000 mL | OROMUCOSAL | Status: DC
  Administered 2018-12-30 – 2018-12-31 (×8): 15 mL via OROMUCOSAL

## 2018-12-30 MED ORDER — PROPOFOL 1000 MG/100ML IV EMUL: 5.0000 ug/kg/min | INTRAVENOUS | Status: DC

## 2018-12-30 MED ORDER — RIFAXIMIN 550 MG PO TABS
550.0000 mg | ORAL_TABLET | Freq: Two times a day (BID) | ORAL | Status: DC
  Administered 2018-12-31: 550 mg via ORAL
  Filled 2018-12-30 (×3): qty 1

## 2018-12-30 MED ORDER — NOREPINEPHRINE 16 MG/250ML-% IV SOLN
0.0000 ug/min | INTRAVENOUS | Status: DC
  Administered 2018-12-30: 24 ug/min via INTRAVENOUS
  Administered 2018-12-31: 25 ug/min via INTRAVENOUS
  Administered 2018-12-31: 36 ug/min via INTRAVENOUS
  Filled 2018-12-30 (×3): qty 250

## 2018-12-30 NOTE — Consult Note (Signed)
NAME:  Michael Hamilton, MRN:  [id number], DOB:  09/14/1960, LOS: 4 ADMISSION DATE:  01/17/2019, CONSULTATION DATE:  12/30/18 REFERRING MD:  [place] CHIEF COMPLAINT:  abd pain   Brief History   58 year old man with autoimmune hepatitis progressed to cirrhosis presenting with SBP, HRS, and possibly SBO.  History of present illness   58 year old man with autoimmune hepatitis progressed to cirrhosis presenting with SBP, HRS, and possibly SBO vs ileus.  Undergoing workup for potential transplant but unfortunately developed progressive renal injury and started HD today.  Shortly after starting HD he became less responsive, Bps dropped and he required intubation for airway protection.  CCM has been asked to help manage patient and take over as primary.  Past Medical History  Autoimmune hepatitis Cirrhosis Type 2 DM HTN  Significant Hospital Events   12/30/18 intubation  Consults:  Nephrology ID Gastroenterology Palliative Care  Procedures:  12/30/18 intubation  Significant Diagnostic Tests:  AXR: ileus vs. SBO CT A/P: large volume ascites  Micro Data:  Ascites fluid- E coli and Klebsiella sensitive to ceftriaxone  Antimicrobials:  Ceftriaxone 6/5-6/7 Entacavir? Meropenem 6/7-present Flagyl 6/6-6/8  Interim history/subjective:  See above  Objective   Blood pressure (!) 85/45, pulse (!) 103, temperature (!) 97.4 F (36.3 C), temperature source Axillary, resp. rate (!) 26, height 5\' 5"  (1.651 m), weight 71.4 kg, SpO2 99 %.    Vent Mode: PRVC FiO2 (%):  [100 %] 100 % Set Rate:  [16 bmp] 16 bmp Vt Set:  [490 mL] 490 mL PEEP:  [5 cmH20] 5 cmH20 Plateau Pressure:  [25 cmH20] 25 cmH20   Intake/Output Summary (Last 24 hours) at 12/30/2018 1623 Last data filed at 12/30/2018 1155 Gross per 24 hour  Intake 775 ml  Output 451 ml  Net 324 ml   Filed Weights   12/27/18 1710 12/29/18 0700 12/30/18 0422  Weight: 65.6 kg 70.8 kg 71.4 kg    Examination: General: intubated jaundiced  man in NAD HENT: ETT in place with minimal secretions Lungs: passive on vent Cardiovascular: RRR, HSM over precordium, ext warm Abdomen: markedly distended and tympanic to percussion Extremities: trace LE edema Neuro: still sedated from induction GU: foley in place with minimal dark urine  Resolved Hospital Problem list    Assessment & Plan:  #Advancing liver failure #Renal failure #Acute hypoxemic respiratory failure #Shock, multifactorial, primarily distributive related to sepsis and ongoing liver failure #Encephalopathy related to advancing liver failure #SBO vs. Ileus #Thrombocytopenia and coagulopathy of liver disease  - Usual vent bundle and VAP precautions, check ABG - Abx per ID - Levophed/vasopressin/stress steroids - Albumin supplementation - Will place central line and A line - Initiate CRRT at some point per nephrology - Very guarded prognosis, SW working on insurance issues, transplant is likely his only option but not sure he will survive long enough to get it  Building surveyor:  Diet: NPO, NGT to LIS Pain/Anxiety/Delirium protocol (if indicated): propofol and fentanyl gtt titrated to RASS 0 VAP protocol (if indicated): yes DVT prophylaxis: SCDs given thrombocytopenia GI prophylaxis: PPI Glucose control: CBG q4h Mobility: bedrest Code Status: Full for now, Icard discussing Family Communication: Icard discussing at this time Disposition:  ICU  Labs   CBC: Recent Labs  Lab 12/27/2018 0643 12/26/18 0718 12/27/18 0602 12/28/18 0911 12/29/18 0940 12/30/18 0439  WBC 26.9* 37.8* 31.2*  --  21.4* 27.3*  NEUTROABS 23.0*  --   --   --   --  23.4*  HGB 9.7* 9.7*  10.1*  --  10.5* 11.8*  HCT 28.8* 30.0* 31.7* 27.4* 31.0* 35.4*  MCV 76.2* 77.7* 77.3*  --  75.6* 76.6*  PLT 75* 127* 139*  --  120* 784    Basic Metabolic Panel: Recent Labs  Lab 12/27/18 1659 12/28/18 0250 12/28/18 0911 12/29/18 0223 12/30/18 0438  NA 124* 121* 123* 125* 128*  K 5.6* 5.9*  5.9* 4.6 4.8  CL 86* 87* 86* 86* 88*  CO2 24 21* 24 25 20*  GLUCOSE 467* 365* 383* 201* 42*  BUN 62* 67* 70* 80* 97*  CREATININE 3.61* 3.82* 3.89* 3.83* 5.09*  CALCIUM 7.8* 7.6* 7.8* 7.9* 8.9  PHOS  --  7.0*  --   --   --    GFR: Estimated Creatinine Clearance: 13.6 mL/min (A) (by C-G formula based on SCr of 5.09 mg/dL (H)). Recent Labs  Lab 01/11/2019 0644 01/07/2019 1003 12/26/18 0718 12/27/18 0602 12/29/18 0940 12/30/18 0439 12/30/18 0948  WBC  --   --  37.8* 31.2* 21.4* 27.3*  --   LATICACIDVEN 6.4* 5.1*  --   --   --   --  10.9*    Liver Function Tests: Recent Labs  Lab 12/30/2018 0643 01/15/2019 1539 12/27/18 0602 12/28/18 0250 12/29/18 0223 12/30/18 0438  AST 27  --  395* 212* 188* 297*  ALT 29  --  425* 266* 241* 235*  ALKPHOS 53  --  102 96 110 107  BILITOT 9.5* 8.4* 22.5* 22.9* 26.9* 34.9*  PROT 7.1  --  6.3* 5.9* 6.5 6.7  ALBUMIN 2.1*  --  2.9* 3.1*  3.1* 3.0* 2.9*   Recent Labs  Lab 01/02/2019 0643  LIPASE 38   Recent Labs  Lab 12/27/18 1946 12/30/18 0948  AMMONIA 71* 52*    ABG    Component Value Date/Time   TCO2 28 11/21/2011 1211     Coagulation Profile: Recent Labs  Lab 01/15/2019 0643 12/27/18 0602 12/28/18 1225 12/29/18 0940 12/30/18 0439  INR 2.0* 4.1* 4.8* 4.1* 5.9*    Cardiac Enzymes: Recent Labs  Lab 12/24/2018 0644  TROPONINI <0.03    HbA1C: Hemoglobin A1C  Date/Time Value Ref Range Status  12/23/2018 09:06 AM 7.7 (A) 4.0 - 5.6 % Final  09/15/2018 10:55 AM 4.8 4.0 - 5.6 % Final   Hgb A1c MFr Bld  Date/Time Value Ref Range Status  01/03/2019 06:43 AM 8.0 (H) 4.8 - 5.6 % Final    Comment:    (NOTE)         Prediabetes: 5.7 - 6.4         Diabetes: >6.4         Glycemic control for adults with diabetes: <7.0   09/05/2016 09:26 AM 11.9 (H) 4.6 - 6.5 % Final    Comment:    Glycemic Control Guidelines for People with Diabetes:Non Diabetic:  <6%Goal of Therapy: <7%Additional Action Suggested:  >8%     CBG: Recent Labs   Lab 12/30/18 0504 12/30/18 0659 12/30/18 0739 12/30/18 0900 12/30/18 1112  GLUCAP 180* 109* 74 74 81    Review of Systems:   Cannot obtain  Past Medical History  He,  has a past medical history of Anemia, Cirrhosis ([place]), Diabetes mellitus ([place]), Essential hypertension, Frequent headaches, and Rotator cuff arthropathy of right shoulder.   Surgical History    Past Surgical History:  Procedure Laterality Date  . IR FLUORO GUIDE CV LINE RIGHT  12/28/2018  . IR TRANSCATHETER BX  11/16/2018  . IR US GUIDE VASC  ACCESS RIGHT  11/16/2018  . IR US GUIDE VASC ACCESS RIGHT  12/28/2018  . IR VENOGRAM HEPATIC WO HEMODYNAMIC EVALUATION  11/16/2018  . SHOULDER ARTHROSCOPY W/ ROTATOR CUFF REPAIR Right      Social History   reports that he has quit smoking. He quit after 2.00 years of use. He has never used smokeless tobacco. He reports that he does not drink alcohol or use drugs.   Family History   His family history is negative for Diabetes, Colon cancer, and Esophageal cancer.   Allergies No Known Allergies   Home Medications  Prior to Admission medications   Medication Sig Start Date End Date Taking? Authorizing Provider  entecavir (BARACLUDE) 0.5 MG tablet Take 1 tablet (0.5 mg total) by mouth daily. 11/18/18  Yes Armbruster, Carlota Raspberry, MD  furosemide (LASIX) 20 MG tablet Take 1 tablet (20 mg total) by mouth daily. 10/06/18  Yes Armbruster, Carlota Raspberry, MD  Insulin Glargine (LANTUS SOLOSTAR) 100 UNIT/ML Solostar Pen Inject 15 Units into the skin daily at 10 pm. 12/23/18  Yes Isabelle Course, MD  METFORMIN HCL PO Take 1 tablet by mouth 2 (two) times a day.   Yes [provider]  morphine (MSIR) 15 MG tablet Take 1 tablet (15 mg total) by mouth every 4 (four) hours as needed for severe pain. 12/13/18  Yes Deno Etienne, DO  omeprazole (PRILOSEC) 40 MG capsule Take 1 capsule (40 mg total) by mouth 2 (two) times daily. 11/10/18  Yes Armbruster, Carlota Raspberry, MD  ondansetron (ZOFRAN ODT) 4 MG  disintegrating tablet Take 1 tablet (4 mg total) by mouth every 8 (eight) hours as needed for nausea or vomiting. 12/13/18  Yes Deno Etienne, DO  polyethylene glycol [facility]) packet Take 17 g by mouth 2 (two) times daily. 08/21/18  Yes Santos-Sanchez, Merlene Morse, MD  propranolol (INDERAL) 20 MG tablet Take 1 tablet (20 mg total) by mouth 2 (two) times daily. 11/05/18  Yes Armbruster, Carlota Raspberry, MD  spironolactone (ALDACTONE) 50 MG tablet Take 1 tablet (50 mg total) by mouth daily. 10/06/18  Yes Armbruster, Carlota Raspberry, MD  glucose blood (ONETOUCH VERIO) test strip Test blood sugars as directed. Dx: E11.9 09/11/16   Dorothyann Peng, NP  Insulin Pen Needle 31G X 5 MM MISC Use 1 time daily with injections 07/31/18   [name], MD  ONE TOUCH LANCETS MISC Test blood sugars as directed. Dx: E11.9 09/11/16   Dorothyann Peng, NP  propranolol (INDERAL) 20 MG/5ML solution Take 5 mLs (20 mg total) by mouth 2 (two) times a day. Patient not taking: Reported on 01/12/2019 11/06/18   Armbruster, Carlota Raspberry, MD  senna-docusate (SENOKOT-S) 8.6-50 MG tablet Take 2 tablets by mouth 2 (two) times daily. Patient not taking: Reported on 01/15/2019 08/21/18   [name], MD     Critical care time: 31 minutes not including any separately billable procedures

## 2018-12-30 NOTE — Progress Notes (Signed)
Patient pulled out NG tube this AM. Mittens placed on patient to help protect patient from pulling at HD cath. Per Dr. [place] leave out NG tube. Patient had BM this morning and bowel sounds are present.

## 2018-12-30 NOTE — Progress Notes (Signed)
Patient ID: Michael Hamilton, male   DOB: 1961-04-09, 58 y.o.   MRN: [id number]          [facility] for Infectious Disease    Date of Admission:  12/29/2018    Total days of antibiotics 16        Day 6 IV antibiotic therapy        Day 4 meropenem  Michael Hamilton had further deterioration overnight with greater lethargy, hypoglycemia and worsening hepatic and renal function.  He has been transferred to [facility] for urgent hemodialysis.  He is on therapy for spontaneous bacterial peritonitis.  His initial peritoneal fluid cultures have grown Klebsiella and E. coli.  No anaerobes were isolated as previously reported.  I will narrow therapy back to ceftriaxone.  Repeat peritoneal fluid cultures 3 days ago are negative but is white blood cell count in peritoneal fluid was still over 10,000.  He should not need a prolonged course of antibiotic therapy.         [name], MD [facility] [place] [facility] [phone] pager   [phone] cell 12/30/2018, 2:51 PM

## 2018-12-30 NOTE — Progress Notes (Signed)
PROGRESS NOTE    Michael Hamilton  KZS:[id number] DOB: [birth year] DOA: 01/06/2019 PCP: [name], MD    Brief Narrative:   58 year old male who presented with worsening abdominal pain and ascites.  He does have history of autoimmune hepatitis, liver cirrhosis, thrombocytopenia, type 2 diabetes mellitus and hypertension.  Patient reported worsening abdominal pain for about 7 days, associated with increased abdominal girth, nausea, low-grade fever and orthopnea.  On his initial physical examination his blood pressure was 117/79, respiratory rate 18, pulse rate 102, temperature 99.4, oxygen saturation 99% on room air.  His lungs were clear to auscultation bilaterally, heart S1-S2 present and rhythmic, his abdomen was soft, tender to palpation, voluntary guarding.  No lower extremity edema.  Sodium 122, potassium 4.5, chloride 90, bicarb 22, glucose 287, BUN 24, creatinine 1.24, white count 26.9, hemoglobin 9.7, hematocrit 28.8, platelets 75, NR 2.0.  SARS COVID-19 negative.  Urinalysis negative for infection.  CT of the abdomen with cirrhosis, associated splenomegaly, large abdominal and pelvic ascites, trace bilateral pleural effusions. Chest radiograph hypoinflated, no infiltrates.  EKG 97 bpm, right axis deviation, normal intervals, sinus rhythm, no ST segment or T wave changes.  Patient was admitted to the hospital for working diagnosis of spontaneous bacterial peritonitis complicated by hyponatremia and sepsis.    Assessment & Plan:   Principal Problem:   SBP (spontaneous bacterial peritonitis) ([place]) Active Problems:   Diabetes mellitus without complication (HCC)   Essential hypertension   Autoimmune hepatitis ([place])   Microcytic anemia   Paraproteinemia   Hyponatremia   Cirrhosis of liver (HCC)   Acute kidney injury (HCC)   Thrombocytopenia (HCC)   Esophageal varices (HCC)   Hepatitis C antibody test positive   Hepatitis B core antibody positive   Acute on chronic liver  failure Decompensated cirrhosis acute spontaneous bacterial peritonitis sepsis (present on admission) Presenting with progressive abdominal discomfort, 7 days prior to admission.  Also noted increased abdominal girth.  History of autoimmune hepatitis/cirrhosis with coagulopathy treated with prednisone and entecavir (to prevent reactivation of Hep B).  He continued with progressive confusion and encephalopathy.  Underwent paracentesis 6/5 with 2.7L removed. Ascitic fluid culture positive for Klebsiella pneumonia and E. coli.  Blood cultures x2 negative x5 days. --[facility] following; appreciate assistance --AST 395-->212-->188-->297 --ALT 425-->266-->241-->235 --Tbili 22.5-->22.9-->26.9-->34.9 --INR 2.0-->4.1-->4.8-->4.1-->5.9 --WBC 37.8-->31.2-->21.4-->27.3 --Continue meropenem per ID and GI --Will repeat IV vit K 10mg  today --Daily CMP/CBC, PT/INR --GI working on possible transfer to [facility] for consideration of liver transplant. --Awaiting emergency Medicaid application  Acute metabolic encephalopathy Hepatic encephalopathy Etiology likely multifactorial with sepsis/SBP, lactic acidosis with SBO, progressing hepatic failure coupled with uremia from renal failure. --Ammonia level 71-->52 --Continue lactulose enemas --CT head and A/P pending  Small bowel obstruction Patient removed NG tube overnight.  KUB notable for unchanged small bowel dilation consistent with obstruction on 12/30/2018. --Replace NG tube today, to low wall suction --CT abdomen/pelvis without contrast pending --Supportive care --May need general surgery evaluation; but unfortunately not the greatest surgical candidate at this standpoint.  AKI likely secondary to hepatorenal syndrome Patient with progressive decline in renal function.  Urine output 100 mL's over the past 24 hours.  Creatinine now up to 5.09 with a BUN of 97.  Renal ultrasound with normal-sized kidneys without hydronephrosis and mild increased  cortical echogenicity consistent with medical renal disease.  --Continues with progressive encephalopathy as above. --Nephrology following, appreciate assistance --HD catheter placed by IR in right IJ --Continues on midodrine --Nephrology discontinued bicarb drip and  octreotide today --Plan to transfer to [facility] to initiate emergent dialysis today  T2DM Hemoglobin A1c 8.0 on 01/15/2019.  Patient has had several hypoglycemic events over the past 24 hours.  Etiology likely inadequate oral intake coupled with his sepsis, and SBO as above. --Discontinued scheduled NovoLog and Lantus --Start D5NS --Continue to monitor blood glucose closely    DVT prophylaxis: SCDs Code Status: Full code Family Communication: Updated daughter today at bedside Disposition Plan: Transfer to [facility] for hemodialysis   Consultants:   [facility]  Nephrology  Infectious disease  Palliative care  Procedures:   Paracentesis  Right IJ hemodialysis catheter  Antimicrobials:  Meropenem   Subjective: Patient seen and examined at bedside, daughter present.  Continues to be encephalopathic; no significant change over the past 24 hours.  Continues with little to no oral intake and poor appetite.  Urine output continues to dwindle, with only 100 mL's out of the past 24 hours.  Renal function worsening.  Updated by nephrology this morning about requested transfer to [facility] for emergent dialysis.  Objective: Vitals:   12/30/18 1023 12/30/18 1100 12/30/18 1200 12/30/18 1215  BP: (!) 96/47 (!) 103/47 (!) 146/113 (!) 106/49  Pulse: (!) 103 99 (!) 103   Resp: 19 (!) 23 (!) 21 (!) 26  Temp:      TempSrc:      SpO2: 95% 96% 97% 95%  Weight:      Height:        Intake/Output Summary (Last 24 hours) at 12/30/2018 1225 Last data filed at 12/30/2018 1155 Gross per 24 hour  Intake 775 ml  Output 451 ml  Net 324 ml   Filed Weights   12/27/18 1710 12/29/18 0700 12/30/18 0422  Weight:  65.6 kg 70.8 kg 71.4 kg    Examination:  General exam: Chronically ill in appearance, altered Respiratory system: Clear to auscultation. Respiratory effort normal. Cardiovascular system: S1 & S2 heard, RRR. No JVD, murmurs, rubs, gallops or clicks. 2+ pitting edema bilateral lower extremities Gastrointestinal system: Abdomen is distended/tense.  Tenderness to palpation, no organomegaly or masses felt.  Faint to absent bowel sounds. Central nervous system: Somnolent, altered, asterixis Extremities: No joint deformities Skin: No rashes Psychiatry: Altered, confused    Data Reviewed: I have personally reviewed following labs and imaging studies  CBC: Recent Labs  Lab 01/15/2019 0643 12/26/18 0718 12/27/18 0602 12/28/18 0911 12/29/18 0940 12/30/18 0439  WBC 26.9* 37.8* 31.2*  --  21.4* 27.3*  NEUTROABS 23.0*  --   --   --   --  23.4*  HGB 9.7* 9.7* 10.1*  --  10.5* 11.8*  HCT 28.8* 30.0* 31.7* 27.4* 31.0* 35.4*  MCV 76.2* 77.7* 77.3*  --  75.6* 76.6*  PLT 75* 127* 139*  --  120* 623   Basic Metabolic Panel: Recent Labs  Lab 12/27/18 1659 12/28/18 0250 12/28/18 0911 12/29/18 0223 12/30/18 0438  NA 124* 121* 123* 125* 128*  K 5.6* 5.9* 5.9* 4.6 4.8  CL 86* 87* 86* 86* 88*  CO2 24 21* 24 25 20*  GLUCOSE 467* 365* 383* 201* 42*  BUN 62* 67* 70* 80* 97*  CREATININE 3.61* 3.82* 3.89* 3.83* 5.09*  CALCIUM 7.8* 7.6* 7.8* 7.9* 8.9  PHOS  --  7.0*  --   --   --    GFR: Estimated Creatinine Clearance: 13.6 mL/min (A) (by C-G formula based on SCr of 5.09 mg/dL (H)). Liver Function Tests: Recent Labs  Lab 01/08/2019 7628 12/30/2018 1539 12/27/18  0602 12/28/18 0250 12/29/18 0223 12/30/18 0438  AST 27  --  395* 212* 188* 297*  ALT 29  --  425* 266* 241* 235*  ALKPHOS 53  --  102 96 110 107  BILITOT 9.5* 8.4* 22.5* 22.9* 26.9* 34.9*  PROT 7.1  --  6.3* 5.9* 6.5 6.7  ALBUMIN 2.1*  --  2.9* 3.1*   3.1* 3.0* 2.9*   Recent Labs  Lab 01/06/2019 0643  LIPASE 38   Recent Labs    Lab 12/27/18 1946 12/30/18 0948  AMMONIA 71* 52*   Coagulation Profile: Recent Labs  Lab 01/08/2019 0643 12/27/18 0602 12/28/18 1225 12/29/18 0940 12/30/18 0439  INR 2.0* 4.1* 4.8* 4.1* 5.9*   Cardiac Enzymes: Recent Labs  Lab 01/09/2019 0644  TROPONINI <0.03   BNP (last 3 results) No results for input(s): PROBNP in the last 8760 hours. HbA1C: No results for input(s): HGBA1C in the last 72 hours. CBG: Recent Labs  Lab 12/30/18 0504 12/30/18 0659 12/30/18 0739 12/30/18 0900 12/30/18 1112  GLUCAP 180* 109* 74 74 81   Lipid Profile: No results for input(s): CHOL, HDL, LDLCALC, TRIG, CHOLHDL, LDLDIRECT in the last 72 hours. Thyroid Function Tests: No results for input(s): TSH, T4TOTAL, FREET4, T3FREE, THYROIDAB in the last 72 hours. Anemia Panel: Recent Labs    12/28/18 0911  VITAMINB12 2,203*  FERRITIN 907*  TIBC NOT CALCULATED  IRON 51   Sepsis Labs: Recent Labs  Lab 01/08/2019 0644 12/28/2018 1003 12/30/18 0948  LATICACIDVEN 6.4* 5.1* 10.9*    Recent Results (from the past 419 hour(s))  Helicobacter pylori special antigen     Status: None   Collection Time: 12/24/18  2:23 PM  Result Value Ref Range Status   MICRO NUMBER: [id number]  Final   SPECIMEN QUALITY Adequate  Final   SOURCE: STOOL  Final   STATUS: FINAL  Final   RESULT:   Final    Not Detected  Antimicrobials, proton pump inhibitors, and bismuth preparations inhibit H. pylori and ingestion up to two weeks prior to testing may cause false negative results. If clinically indicated the test should be repeated on a new specimen  obtained two weeks after discontinuing treatment.   SARS Coronavirus 2 (CEPHEID - Performed in [facility] hospital lab), Hosp Order     Status: None   Collection Time: 01/08/2019  6:42 AM  Result Value Ref Range Status   SARS Coronavirus 2 NEGATIVE NEGATIVE Final    Comment: (NOTE) If result is NEGATIVE SARS-CoV-2 target nucleic acids are NOT DETECTED. The SARS-CoV-2 RNA  is generally detectable in upper and lower  respiratory specimens during the acute phase of infection. The lowest  concentration of SARS-CoV-2 viral copies this assay can detect is 250  copies / mL. A negative result does not preclude SARS-CoV-2 infection  and should not be used as the sole basis for treatment or other  patient management decisions.  A negative result may occur with  improper specimen collection / handling, submission of specimen other  than nasopharyngeal swab, presence of viral mutation(s) within the  areas targeted by this assay, and inadequate number of viral copies  (<250 copies / mL). A negative result must be combined with clinical  observations, patient history, and epidemiological information. If result is POSITIVE SARS-CoV-2 target nucleic acids are DETECTED. The SARS-CoV-2 RNA is generally detectable in upper and lower  respiratory specimens dur ing the acute phase of infection.  Positive  results are indicative of active infection with SARS-CoV-2.  Clinical  correlation with patient history and other diagnostic information is  necessary to determine patient infection status.  Positive results do  not rule out bacterial infection or co-infection with other viruses. If result is PRESUMPTIVE POSTIVE SARS-CoV-2 nucleic acids MAY BE PRESENT.   A presumptive positive result was obtained on the submitted specimen  and confirmed on repeat testing.  While 2019 novel coronavirus  (SARS-CoV-2) nucleic acids may be present in the submitted sample  additional confirmatory testing may be necessary for epidemiological  and / or clinical management purposes  to differentiate between  SARS-CoV-2 and other Sarbecovirus currently known to infect humans.  If clinically indicated additional testing with an alternate test  methodology [id number]) is advised. The SARS-CoV-2 RNA is generally  detectable in upper and lower respiratory sp ecimens during the acute  phase of  infection. The expected result is Negative. Fact Sheet for Patients:  StrictlyIdeas.no Fact Sheet for Healthcare Providers: BankingDealers.co.za This test is not yet approved or cleared by the [place] FDA and has been authorized for detection and/or diagnosis of SARS-CoV-2 by FDA under an Emergency Use Authorization (EUA).  This EUA will remain in effect (meaning this test can be used) for the duration of the COVID-19 declaration under Section 564(b)(1) of the Act, 21 U.S.C. section 360bbb-3(b)(1), unless the authorization is terminated or revoked sooner. Performed at [facility], [place] [address] [place], [place] [address]   Culture, blood (routine x 2)     Status: None   Collection Time: 12/30/2018  7:40 AM  Result Value Ref Range Status   Specimen Description   Final    BLOOD LEFT HAND Performed at [facility], [place] [address], [place], [place] [address]    Special Requests   Final    BOTTLES DRAWN AEROBIC AND ANAEROBIC Blood Culture adequate volume Performed at [place] [address], [place], [place] [address]    Culture   Final    NO GROWTH 5 DAYS Performed at [facility], [place] [address] [place], [place] [address]    Report Status 12/30/2018 FINAL  Final  Culture, blood (routine x 2)     Status: None   Collection Time: 12/24/2018  7:50 AM  Result Value Ref Range Status   Specimen Description   Final    BLOOD BLOOD LEFT WRIST Performed at [place] [address] [place], [place] [address]    Special Requests   Final    BOTTLES DRAWN AEROBIC AND ANAEROBIC Blood Culture results may not be optimal due to an excessive volume of blood received in culture bottles Performed at [place] [address] [place], [place] [address]    Culture   Final    NO GROWTH 5 DAYS Performed at [facility], [place] [address]  [place], [place] [address]    Report Status 12/30/2018 FINAL  Final  Culture, body fluid-bottle     Status: Abnormal   Collection Time: 01/19/2019 12:44 PM  Result Value Ref Range Status   Specimen Description PERITONEAL  Final   Special Requests NONE  Final   Gram Stain   Final    GRAM NEGATIVE RODS IN BOTH AEROBIC AND ANAEROBIC BOTTLES CRITICAL RESULT CALLED TO, READ BACK BY AND VERIFIED WITH: JOHNSON AT [place] [id number] BY SJW Performed at [facility], [place] [address] [place], [place] [address]    Culture KLEBSIELLA PNEUMONIAE ESCHERICHIA COLI  (A)  Final  Report Status 12/30/2018 FINAL  Final   Organism ID, Bacteria KLEBSIELLA PNEUMONIAE  Final   Organism ID, Bacteria ESCHERICHIA COLI  Final      Susceptibility   Escherichia coli - MIC*    AMPICILLIN >=32 RESISTANT Resistant     CEFAZOLIN <=4 SENSITIVE Sensitive     CEFEPIME <=1 SENSITIVE Sensitive     CEFTAZIDIME <=1 SENSITIVE Sensitive     CEFTRIAXONE <=1 SENSITIVE Sensitive     CIPROFLOXACIN >=4 RESISTANT Resistant     GENTAMICIN >=16 RESISTANT Resistant     IMIPENEM <=0.25 SENSITIVE Sensitive     TRIMETH/SULFA >=320 RESISTANT Resistant     AMPICILLIN/SULBACTAM >=32 RESISTANT Resistant     PIP/TAZO <=4 SENSITIVE Sensitive     Extended ESBL NEGATIVE Sensitive     * ESCHERICHIA COLI   Klebsiella pneumoniae - MIC*    AMPICILLIN >=32 RESISTANT Resistant     CEFAZOLIN <=4 SENSITIVE Sensitive     CEFEPIME <=1 SENSITIVE Sensitive     CEFTAZIDIME <=1 SENSITIVE Sensitive     CEFTRIAXONE <=1 SENSITIVE Sensitive     CIPROFLOXACIN >=4 RESISTANT Resistant     GENTAMICIN >=16 RESISTANT Resistant     IMIPENEM <=0.25 SENSITIVE Sensitive     TRIMETH/SULFA >=320 RESISTANT Resistant     AMPICILLIN/SULBACTAM 16 INTERMEDIATE Intermediate     PIP/TAZO <=4 SENSITIVE Sensitive     Extended ESBL NEGATIVE Sensitive     * KLEBSIELLA PNEUMONIAE  Gram stain     Status: None   Collection Time: 01/06/2019 12:44 PM  Result Value Ref Range  Status   Specimen Description PERITONEAL  Final   Special Requests NONE  Final   Gram Stain   Final    ABUNDANT WBC PRESENT,BOTH PMN AND MONONUCLEAR NO ORGANISMS SEEN Performed at [facility], [address] [place], [place] [address]    Report Status 12/27/2018 FINAL  Final  Urine culture     Status: Abnormal   Collection Time: 01/10/2019  1:12 PM  Result Value Ref Range Status   Specimen Description   Final    URINE, RANDOM Performed at [place] [address] [place], [place] [address]    Special Requests   Final    NONE Performed at [facility], [place] [address], [place], [place] [address]    Culture MULTIPLE SPECIES PRESENT, SUGGEST RECOLLECTION (A)  Final   Report Status 12/27/2018 FINAL  Final  Culture, body fluid-bottle     Status: None (Preliminary result)   Collection Time: 12/27/18 12:25 PM  Result Value Ref Range Status   Specimen Description PERITONEAL FLUID  Final   Special Requests BOTTLES DRAWN AEROBIC AND ANAEROBIC  Final   Culture   Final    NO GROWTH 3 DAYS Performed at [facility], [place] [address] [place], [place] [address]    Report Status PENDING  Incomplete  Gram stain     Status: None   Collection Time: 12/27/18 12:25 PM  Result Value Ref Range Status   Specimen Description PERITONEAL FLUID  Final   Special Requests NONE  Final   Gram Stain   Final    WBC PRESENT, PREDOMINANTLY PMN NO ORGANISMS SEEN CYTOSPIN SMEAR Performed at [facility], [place] [address] [place], [place] [address]    Report Status 12/28/2018 FINAL  Final  MRSA PCR Screening     Status: None   Collection Time: 12/27/18  2:17 PM  Result Value Ref Range Status   MRSA  by PCR NEGATIVE NEGATIVE Final    Comment:        The GeneXpert MRSA Assay (FDA approved for NASAL specimens only), is one component of a comprehensive MRSA colonization surveillance program. It is not intended to diagnose MRSA infection nor to  guide or monitor treatment for MRSA infections. Performed at [facility], [place] [address] [place], [place] [address]          Radiology Studies: Dg Abd 1 View  Result Date: 12/30/2018 CLINICAL DATA:  NG tube placement. EXAM: ABDOMEN - 1 VIEW COMPARISON:  December 30, 2018 FINDINGS: The NG tube terminates with the side port and distal tip within the stomach. Continued small bowel obstruction. IMPRESSION: The distal tip and side port of the NG tube are within the stomach, in good position. Continued small bowel obstruction. Electronically Signed   By: Dorise Bullion III M.D   On: 12/30/2018 12:18   Dg Abd 1 View  Result Date: 12/30/2018 CLINICAL DATA:  Small bowel obstruction. EXAM: ABDOMEN - 1 VIEW COMPARISON:  12/29/2018 FINDINGS: An enteric tube is no longer seen. Moderate gaseous dilatation of multiple small bowel loops is similar to the prior study. Bulging flanks and hazy abdominal density are consistent with known ascites. No gross intraperitoneal free air is identified on this supine study which partially excludes the left hemiabdomen. IMPRESSION: Unchanged small bowel dilatation consistent with obstruction. Electronically Signed   By: Logan Bores M.D.   On: 12/30/2018 10:16   Dg Abd 1 View  Result Date: 12/29/2018 CLINICAL DATA:  58 y.o. Male continues to complain of abdominal pain and distention, and feels poorly in general. No bowel movement in 3 to 4 days. EXAM: ABDOMEN - 1 VIEW COMPARISON:  CT of the abdomen and pelvis on 01/06/2019, abdominal plain films on 07/31/2018 FINDINGS: Interval development of significant small bowel dilatation, with bowel loops measuring up to 0.9 centimeters. The supine views performed show no free intraperitoneal air. Central location of air-filled bowel loops consistent with known ascites. IMPRESSION: Interval development of high grade small bowel obstruction. Ascites. These results will be called to the ordering clinician or  representative by the Radiologist Assistant, and communication documented in the PACS or zVision Dashboard. Electronically Signed   By: Nolon Nations M.D.   On: 12/29/2018 12:48   Ir Fluoro Guide Cv Line Right  Result Date: 12/29/2018 CLINICAL DATA:  Acute kidney injury, hepatic failure and need for non tunneled hemodialysis catheter for potential emergent hemodialysis. EXAM: NON-TUNNELED CENTRAL VENOUS CATHETER PLACEMENT WITH ULTRASOUND AND FLUOROSCOPIC GUIDANCE FLUOROSCOPY TIME:  36 seconds.  5.7 mGy. PROCEDURE: The procedure, risks, benefits, and alternatives were explained to the patient's daughter. Questions regarding the procedure were encouraged and answered. The patient's daughter understands and consents to the procedure. A time-out was performed prior to initiating the procedure. Ultrasound was used to confirm patency of the right internal jugular vein. The right neck and chest were prepped with chlorhexidine in a sterile fashion, and a sterile drape was applied covering the operative field. Maximum barrier sterile technique with sterile gowns and gloves were used for the procedure. Local anesthesia was provided with 1% lidocaine. After creating a small venotomy incision, a 21 gauge needle was advanced into the right internal jugular vein under direct, real-time ultrasound guidance. Dilatation was performed over a guidewire. A 13 French, triple lumen Trialysis catheter measuring 15 cm was then advanced over the wire. Final catheter positioning was confirmed and documented with a fluoroscopic spot image. The catheter was  aspirated, flushed with saline, and injected with appropriate volume heparin dwells. The catheter exit site was secured with 0-Prolene retention sutures. COMPLICATIONS: None.  No pneumothorax. FINDINGS: After catheter placement, the tip lies at the cavoatrial junction. The catheter aspirates normally and is ready for immediate use. IMPRESSION: Placement of non-tunneled central venous  hemodialysis catheter via the right internal jugular vein. The catheter tip lies at the cavoatrial junction. The catheter is ready for immediate use. Electronically Signed   By: Aletta Edouard M.D.   On: 12/29/2018 08:27   Ir US Guide Vasc Access Right  Result Date: 12/29/2018 CLINICAL DATA:  Acute kidney injury, hepatic failure and need for non tunneled hemodialysis catheter for potential emergent hemodialysis. EXAM: NON-TUNNELED CENTRAL VENOUS CATHETER PLACEMENT WITH ULTRASOUND AND FLUOROSCOPIC GUIDANCE FLUOROSCOPY TIME:  36 seconds.  5.7 mGy. PROCEDURE: The procedure, risks, benefits, and alternatives were explained to the patient's daughter. Questions regarding the procedure were encouraged and answered. The patient's daughter understands and consents to the procedure. A time-out was performed prior to initiating the procedure. Ultrasound was used to confirm patency of the right internal jugular vein. The right neck and chest were prepped with chlorhexidine in a sterile fashion, and a sterile drape was applied covering the operative field. Maximum barrier sterile technique with sterile gowns and gloves were used for the procedure. Local anesthesia was provided with 1% lidocaine. After creating a small venotomy incision, a 21 gauge needle was advanced into the right internal jugular vein under direct, real-time ultrasound guidance. Dilatation was performed over a guidewire. A 13 French, triple lumen Trialysis catheter measuring 15 cm was then advanced over the wire. Final catheter positioning was confirmed and documented with a fluoroscopic spot image. The catheter was aspirated, flushed with saline, and injected with appropriate volume heparin dwells. The catheter exit site was secured with 0-Prolene retention sutures. COMPLICATIONS: None.  No pneumothorax. FINDINGS: After catheter placement, the tip lies at the cavoatrial junction. The catheter aspirates normally and is ready for immediate use. IMPRESSION:  Placement of non-tunneled central venous hemodialysis catheter via the right internal jugular vein. The catheter tip lies at the cavoatrial junction. The catheter is ready for immediate use. Electronically Signed   By: Aletta Edouard M.D.   On: 12/29/2018 08:27   Dg Abd Portable 1v  Result Date: 12/29/2018 CLINICAL DATA:  NG tube placement. EXAM: PORTABLE ABDOMEN - 1 VIEW COMPARISON:  Earlier film, same date FINDINGS: The NG tube tip is in the body region of the stomach. Again demonstrated are dilated small bowel loops consistent with a small bowel obstruction. No obvious free air. IMPRESSION: NG tube tip is in the body region of the stomach. Persistent small-bowel obstruction bowel gas pattern without obvious free air. Electronically Signed   By: Marijo Sanes M.D.   On: 12/29/2018 15:45        Scheduled Meds:  Chlorhexidine Gluconate Cloth  6 each Topical Q0600   [START ON 2019-01-30] entecavir  0.5 mg Oral Q72H   feeding supplement  1 Container Oral BID BM   feeding supplement (PRO-STAT SUGAR FREE 64)  30 mL Oral BID   insulin aspart  0-9 Units Subcutaneous TID WC   insulin aspart  4 Units Subcutaneous TID WC   insulin glargine  10 Units Subcutaneous Q2200   lactulose  300 mL Rectal BID   midodrine  10 mg Oral TID   pantoprazole  40 mg Oral Daily   sodium chloride flush  10-40 mL Intracatheter Q12H   sodium zirconium  cyclosilicate  10 g Oral Daily   Continuous Infusions:  albumin human     dextrose 5 % and 0.9% NaCl 75 mL/hr at 12/30/18 0828   meropenem (MERREM) IV Stopped (12/30/18 0515)   octreotide  (SANDOSTATIN)    IV infusion 50 mcg/hr (12/30/18 1025)     LOS: 4 days    Time spent: 39 minutes    Zania Kalisz J [place] DO [facility] Pager [phone]  If 7PM-7AM, please contact night-coverage [email or web address] Password [facility] 12/30/2018, 12:25 PM

## 2018-12-30 NOTE — Progress Notes (Addendum)
Inpatient Diabetes Program Recommendations  AACE/ADA: New Consensus Statement on Inpatient Glycemic Control (2015)  Target Ranges:  Prepandial:   less than 140 mg/dL      Peak postprandial:   less than 180 mg/dL (1-2 hours)      Critically ill patients:  140 - 180 mg/dL   Lab Results  Component Value Date   GLUCAP 74 12/30/2018   HGBA1C 8.0 (H) 12/27/2018    Review of Glycemic Control Results for CYPRESS, FANFAN (MRN [id number]) as of 12/30/2018 10:24  Ref. Range 12/30/2018 04:38 12/30/2018 05:04 12/30/2018 06:59 12/30/2018 07:39 12/30/2018 09:00  Glucose-Capillary Latest Ref Range: 70 - 99 mg/dL 41 (LL) 180 (H) 109 (H) 74 74   Inpatient Diabetes Program Recommendations:   Patient did not receive Lantus last pm due to CBG decreased to 65.  Thank you, Nani Gasser. Levetta Bognar, RN, MSN, CDE  Diabetes Coordinator Inpatient Glycemic Control Team Team Pager [phone] (8am-5pm) 12/30/2018 10:26 AM

## 2018-12-30 NOTE — Progress Notes (Signed)
About 40 min into 1st dialysis patient became unreponsive, was rinsed, BP's 60's w/ agonal breathing.  BP's improved w/ rinseback and 750 NS bolus  but breathing remained agonal, code called and pt intubated by anesthesia w/o difficulty.  Have d/w CCM, they will see the patient.  He has a bed on 2H.  Still needs dialysis,  plan gentle regular HD later this evening w/ pressor and IV albumin support as needed.     Kelly Splinter, MD 12/30/2018, 4:08 PM

## 2018-12-30 NOTE — TOC Initial Note (Signed)
Transition of Care [facility]) - Initial/Assessment Note    Patient Details  Name: Michael Hamilton MRN: [id number] Date of Birth: [birth year]  Transition of Care [facility]) CM/SW Contact:    Lynnell Catalan, RN Phone Number: 12/30/2018, 10:52 AM         Readmission Risk Interventions Readmission Risk Prevention Plan 12/30/2018 12/28/2018  Transportation Screening Complete Complete  PCP or Specialist Appt within 3-5 Days Not Complete Not Complete  Not Complete comments Not ready for dc not ready for d/c  [place] or [address] Complete Complete  Social Work Consult for [place] Planning/Counseling Complete Complete  Palliative Care Screening Not Applicable Not Applicable  Medication Review Press photographer) Complete Complete  Some recent data might be hidden

## 2018-12-30 NOTE — Plan of Care (Signed)
  Problem: Activity: Goal: Ability to tolerate increased activity will improve Outcome: Progressing   

## 2018-12-30 NOTE — Progress Notes (Signed)
PHARMACY NOTE:  ANTIMICROBIAL RENAL DOSAGE ADJUSTMENT  Current antimicrobial regimen includes a mismatch between antimicrobial dosage and estimated renal function.  As per policy approved by the Pharmacy & Therapeutics and Medical Executive Committees, the antimicrobial dosage will be adjusted accordingly.  Current antimicrobial dosage:  Meropenem 500 mg q 24 hrs  Indication: Intraabdominal infection  Renal Function:  Estimated Creatinine Clearance: 13.6 mL/min (A) (by C-G formula based on SCr of 5.09 mg/dL (H)). []      On intermittent HD, scheduled: [x]      On CRRT    Antimicrobial dosage has been changed to:   Now starting CRRT - so will adjust back to meropenem 1g q 12 hrs   Thank you for allowing pharmacy to be a part of this patient's care.  Marguerite Olea, [facility] Clinical Pharmacist Phone [phone]  12/30/2018 9:03 PM

## 2018-12-30 NOTE — Progress Notes (Signed)
Gastroenterology Inpatient Follow-up Note   PATIENT IDENTIFICATION  Michael Hamilton is a 58 y.o. male with hepatic liver failure and progressive ARI and uremia complicated by SBP and likely SBO. Hospital Day: 6  SUBJECTIVE  The patient has had a significant decline from yesterday into today. He had an NG tube placed yesterday in the afternoon after finding on KUB to have significant dilation of his small bowel with concern for possible partial small bowel obstruction. He received rectal enemas of lactulose with some output. Unfortunately this morning, he has pulled out his NG tube. Mittens were placed and he is in restraints currently. His mental status has progressively declined. Evaluated by renal with possible plan for transfer for dialysis.  He continues to have abdominal discomfort as a symptom. No fevers or chills. White count was improving yesterday has increased back up to 27,000. INR increased up to the 5 range.   OBJECTIVE  Scheduled Inpatient Medications:   Chlorhexidine Gluconate Cloth  6 each Topical Q0600   [START ON January 07, 2019] entecavir  0.5 mg Oral Q72H   feeding supplement  1 Container Oral BID BM   feeding supplement (PRO-STAT SUGAR FREE 64)  30 mL Oral BID   insulin aspart  0-9 Units Subcutaneous TID WC   insulin aspart  4 Units Subcutaneous TID WC   insulin glargine  10 Units Subcutaneous Q2200   lactulose  300 mL Rectal BID   midodrine  10 mg Oral TID   pantoprazole  40 mg Oral Daily   sodium chloride flush  10-40 mL Intracatheter Q12H   sodium zirconium cyclosilicate  10 g Oral Daily   Continuous Inpatient Infusions:   albumin human     dextrose 5 % and 0.9% NaCl 75 mL/hr at 12/30/18 0828   meropenem (MERREM) IV Stopped (12/30/18 0515)   octreotide  (SANDOSTATIN)    IV infusion 50 mcg/hr (12/29/18 2230)   phytonadione (VITAMIN K) IV     PRN Inpatient Medications: acetaminophen **OR** acetaminophen, albumin human, HYDROmorphone  (DILAUDID) injection, morphine, ondansetron **OR** ondansetron (ZOFRAN) IV, sodium chloride flush   Physical Examination  Temp:  [95.7 F (35.4 C)-97.7 F (36.5 C)] 97.4 F (36.3 C) (06/10 0700) Pulse Rate:  [88-113] 111 (06/10 0800) Resp:  [9-27] 27 (06/10 0800) BP: (94-138)/(57-93) 94/57 (06/10 0800) SpO2:  [88 %-94 %] 93 % (06/10 0800) Weight:  [71.4 kg] 71.4 kg (06/10 0422) Temp (24hrs), Avg:97 F (36.1 C), Min:95.7 F (35.4 C), Max:97.7 F (36.5 C)  Weight: 71.4 kg GEN: Toxic appearing, ill, daughter at bedside  PSYCH: Pleasant though not able to cooperate with exam, unable to follow or answer questions appropriately other than with a yes/no and not often correct further questioning EYE: Icteric sclerae ENT: Dry mucous membranes CV: Tachycardic RESP: Decreased breath sounds at the bases bilaterally GI: Protuberant abdomen, distended, tympanic to percussion, tenderness to palpation with deep palpation, volitional guarding present, no rebound present MSK/EXT: Bilateral lower extremity edema present SKIN: Jaundiced NEURO: Alert and oriented to person, has clonus bilaterally, unable to appreciate of asterixis due to mittens   Review of Data   Laboratory Studies   Recent Labs  Lab 12/28/18 0250  12/30/18 0438  NA 121*   < > 128*  K 5.9*   < > 4.8  CL 87*   < > 88*  CO2 21*   < > 20*  BUN 67*   < > 97*  CREATININE 3.82*   < > 5.09*  GLUCOSE 365*   < >  42*  CALCIUM 7.6*   < > 8.9  PHOS 7.0*  --   --    < > = values in this interval not displayed.   Recent Labs  Lab 12/30/18 0438  AST 297*  ALT 235*  ALKPHOS 107    Recent Labs  Lab 12/27/18 0602  12/29/18 0940 12/30/18 0439  WBC 31.2*  --  21.4* 27.3*  HGB 10.1*  --  10.5* 11.8*  HCT 31.7*   < > 31.0* 35.4*  PLT 139*  --  120* 159   < > = values in this interval not displayed.   Recent Labs  Lab 12/28/18 1225 12/29/18 0940 12/30/18 0439  INR 4.8* 4.1* 5.9*   MELD-Na score: 49 at 12/30/2018  4:39  AM MELD score: 53 at 12/30/2018  4:39 AM Calculated from: Serum Creatinine: 5.09 mg/dL (Rounded to 4 mg/dL) at 12/30/2018  4:38 AM Serum Sodium: 128 mmol/L at 12/30/2018  4:38 AM Total Bilirubin: 34.9 mg/dL at 12/30/2018  4:38 AM INR(ratio): 5.9 at 12/30/2018  4:39 AM Age: 64 years  Imaging Studies  Dg Abd 1 View  Result Date: 12/29/2018 CLINICAL DATA:  58 y.o. Male continues to complain of abdominal pain and distention, and feels poorly in general. No bowel movement in 3 to 4 days. EXAM: ABDOMEN - 1 VIEW COMPARISON:  CT of the abdomen and pelvis on 01/17/2019, abdominal plain films on 07/31/2018 FINDINGS: Interval development of significant small bowel dilatation, with bowel loops measuring up to 0.9 centimeters. The supine views performed show no free intraperitoneal air. Central location of air-filled bowel loops consistent with known ascites. IMPRESSION: Interval development of high grade small bowel obstruction. Ascites. These results will be called to the ordering clinician or representative by the Radiologist Assistant, and communication documented in the PACS or zVision Dashboard. Electronically Signed   By: Nolon Nations M.D.   On: 12/29/2018 12:48   Ir Fluoro Guide Cv Line Right  Result Date: 12/29/2018 CLINICAL DATA:  Acute kidney injury, hepatic failure and need for non tunneled hemodialysis catheter for potential emergent hemodialysis. EXAM: NON-TUNNELED CENTRAL VENOUS CATHETER PLACEMENT WITH ULTRASOUND AND FLUOROSCOPIC GUIDANCE FLUOROSCOPY TIME:  36 seconds.  5.7 mGy. PROCEDURE: The procedure, risks, benefits, and alternatives were explained to the patient's daughter. Questions regarding the procedure were encouraged and answered. The patient's daughter understands and consents to the procedure. A time-out was performed prior to initiating the procedure. Ultrasound was used to confirm patency of the right internal jugular vein. The right neck and chest were prepped with chlorhexidine in a  sterile fashion, and a sterile drape was applied covering the operative field. Maximum barrier sterile technique with sterile gowns and gloves were used for the procedure. Local anesthesia was provided with 1% lidocaine. After creating a small venotomy incision, a 21 gauge needle was advanced into the right internal jugular vein under direct, real-time ultrasound guidance. Dilatation was performed over a guidewire. A 13 French, triple lumen Trialysis catheter measuring 15 cm was then advanced over the wire. Final catheter positioning was confirmed and documented with a fluoroscopic spot image. The catheter was aspirated, flushed with saline, and injected with appropriate volume heparin dwells. The catheter exit site was secured with 0-Prolene retention sutures. COMPLICATIONS: None.  No pneumothorax. FINDINGS: After catheter placement, the tip lies at the cavoatrial junction. The catheter aspirates normally and is ready for immediate use. IMPRESSION: Placement of non-tunneled central venous hemodialysis catheter via the right internal jugular vein. The catheter tip lies at the cavoatrial junction.  The catheter is ready for immediate use. Electronically Signed   By: Aletta Edouard M.D.   On: 12/29/2018 08:27   Ir US Guide Vasc Access Right  Result Date: 12/29/2018 CLINICAL DATA:  Acute kidney injury, hepatic failure and need for non tunneled hemodialysis catheter for potential emergent hemodialysis. EXAM: NON-TUNNELED CENTRAL VENOUS CATHETER PLACEMENT WITH ULTRASOUND AND FLUOROSCOPIC GUIDANCE FLUOROSCOPY TIME:  36 seconds.  5.7 mGy. PROCEDURE: The procedure, risks, benefits, and alternatives were explained to the patient's daughter. Questions regarding the procedure were encouraged and answered. The patient's daughter understands and consents to the procedure. A time-out was performed prior to initiating the procedure. Ultrasound was used to confirm patency of the right internal jugular vein. The right neck and  chest were prepped with chlorhexidine in a sterile fashion, and a sterile drape was applied covering the operative field. Maximum barrier sterile technique with sterile gowns and gloves were used for the procedure. Local anesthesia was provided with 1% lidocaine. After creating a small venotomy incision, a 21 gauge needle was advanced into the right internal jugular vein under direct, real-time ultrasound guidance. Dilatation was performed over a guidewire. A 13 French, triple lumen Trialysis catheter measuring 15 cm was then advanced over the wire. Final catheter positioning was confirmed and documented with a fluoroscopic spot image. The catheter was aspirated, flushed with saline, and injected with appropriate volume heparin dwells. The catheter exit site was secured with 0-Prolene retention sutures. COMPLICATIONS: None.  No pneumothorax. FINDINGS: After catheter placement, the tip lies at the cavoatrial junction. The catheter aspirates normally and is ready for immediate use. IMPRESSION: Placement of non-tunneled central venous hemodialysis catheter via the right internal jugular vein. The catheter tip lies at the cavoatrial junction. The catheter is ready for immediate use. Electronically Signed   By: Aletta Edouard M.D.   On: 12/29/2018 08:27   Dg Abd Portable 1v  Result Date: 12/29/2018 CLINICAL DATA:  NG tube placement. EXAM: PORTABLE ABDOMEN - 1 VIEW COMPARISON:  Earlier film, same date FINDINGS: The NG tube tip is in the body region of the stomach. Again demonstrated are dilated small bowel loops consistent with a small bowel obstruction. No obvious free air. IMPRESSION: NG tube tip is in the body region of the stomach. Persistent small-bowel obstruction bowel gas pattern without obvious free air. Electronically Signed   By: Marijo Sanes M.D.   On: 12/29/2018 15:45    ASSESSMENT  Mr. Mangold is a 58 y.o. male with hepatic liver failure and progressive ARI and uremia complicated by SBP and likely  SBO.  The patient is progressively worsening.  He has progressive acute liver failure.  He is going to be transferred to [facility] for consideration of urgent dialysis to see if uremia may be playing a role with things.  Unfortunately, I think the patient likely has SBP, acute liver failure, has a potential partial or total SBO, and progressive uremia either secondary to HRS physiology versus infectious/contrast-induced nephropathy with progressive kidney injury. I have sent an email to the financial office to see about expediting further his insurance issues. I agree with transfer to [facility] for consideration of urgent dialysis to see if this will help his mental status. I have added a lactic acid as well as an ammonia to evaluate for hepatic encephalopathy progressive (I believe this is likely playing a role as well). Stat KUB to be obtained this morning to evaluate for continued SBO. Likely will need a Noncon CT scan to evaluate his abdomen  and ensure that there is no perforation or anything else that is occurring. I have spoken with the patient's daughter this morning as well as with my team. We will likely have to update the liver transplant centers for their discussion about this patient. Continue broad-spectrum meropenem for now. May need to consider asking general surgery to evaluate the patient as well in the setting of SBO but likely needs to have NG tube replaced based on the imaging today. If he is transferred to [facility] is going  many hours from now we likely need to replace his NG tube after we get the KUB and potentially the CT of the abdomen. I spoken with my PA who will help with expediting things as well this morning.  His prognosis is severely guarded. I spoke with the patient's daughter and have updated her and let her know that she needs to talk with her family significantly as her father is progressively worsening.   Please page/call with questions or  concerns.   Justice Britain, MD [facility] Office # [phone]    LOS: 4 days  Irving Copas  12/30/2018, 8:53 AM

## 2018-12-30 NOTE — Progress Notes (Signed)
12/30/2018 4:02 PM  Spoke with patient daughter to inform her that she has been granted visitor access to assist the patient with communication barriers.   Informed [facility] [employer] about visitor.   Spoke with attending MD Vasireddy about patient condition since patient recently arrived from [facility]. MD stated she would visit patient on 2H.   Ahmiyah Coil MSN, RN-BC, CNML [facility] Phone: [phone]

## 2018-12-30 NOTE — Progress Notes (Addendum)
PCCM:  Progressive multiorgan failure. Severe hypoxemic respiratory failure on maximal mechanical support. Currently on max vent settings 100% 18 PEEP with minimal improvement in sats.  A long discussion with patient family, two daughters present as well as video translation line with patients wife and brother.   All were present for code status discussion and agree that cpr would not be appropriate.   I have made him a DNR.   Please see full consult note by Dr. Tamala Julian.   Overall outcome is poor and I suspect he will pass. Once remainder of family is at the hospital I would recommend palliative withdrawal of care to comfort measures.   I called Dr. Rowe Pavy and updated palliative medicine team.   Michael Nash, DO [facility] 12/30/2018 5:12 PM  Personal pager: [phone] If unanswered, please page CCM On-call: [phone]

## 2018-12-30 NOTE — Consult Note (Signed)
Consultation Note Date: 12/30/2018   Patient Name: Michael Hamilton  DOB: 02/11/1961  MRN: [id number]  Age / Sex: 58 y.o., male  PCP: [name], MD Referring Physician: [place] Eric J, DO  Reason for Consultation: Establishing goals of care  HPI/Patient Profile: 58 y.o. male  with past medical history of autoimmune hepatitis, liver cirrhosis, thrombocytopenia, type 2 diabetes mellitus and hypertension  admitted on 01/02/2019 with worsening abdominal pain and ascites.    Clinical Assessment and Goals of Care:  Patient is confused, trying to pull out his mittens.   Chart reviewed, patient seen and examined. I find his daughter Michael Hamilton at his bedside. I introduced myself and palliative care as follows: Palliative medicine is specialized medical care for people living with serious illness. It focuses on providing relief from the symptoms and stress of a serious illness. The goal is to improve quality of life for both the patient and the family.  Also discussed about goals of care as follows: Goals of care: Broad aims of medical therapy in relation to the patient's values and preferences. Our aim is to provide medical care aimed at enabling patients to achieve the goals that matter most to them, given the circumstances of their particular medical situation and their constraints.   The patient's daughter shares a brief life review of the patient and is the primary historian. She requests that outside interpretors not be contacted, they'd like to translate and provide information as a family.   The patient is originally from [place], patient and his wife, 7 kids live in [place]. He used to work for [employer]. He fell a few years ago and had a shoulder injury. More recently, he has had gradual progressive decline for the past few months.   The patient's daughter is thankful for the  information she has been receiving. She is aware of the patient's current tenuous overall condition. We discussed about current serious illnesses : Acute on chronic liver failure, decompensated cirrhosis. Additionally, has concern for acute spontaneous bacterial peritonitis, small bowel obstruction. We discussed as well about his acute kidney injury deemed secondary to hepato renal syndrome. Patient has underlying illnesses of DM and HTN.   We talked about uremia and its manifestations. Daughter is aware of [facility].   Daughter is tearful, has caregiver burnout. She is at the bedside 24/7 to facilitate conversations, translate for her father as well as keep her family in the loop. We talked about self care and sleep for her, she will reach out to her sister and ask her to come into the hospital for a few hours so that she may go home and get some rest.   NEXT OF KIN  patient is married, originally from [place]. Lives at home with wife and children. He has 7 kids. 2 daughters, including daughter at bedside Michael Hamilton, Michael Hamilton are primary decision makers, they are making decisions in conjunction with the rest of their family.   SUMMARY OF RECOMMENDATIONS  full code, full scope: family desires any and all life maintaining life prolonging measures to continue.  Emergency initiation of dialysis plans are being made Discussed with daughter at bedside about supportive role of early palliative care consultation, offered active listening and supportive care. Daughter Michael Hamilton is tearful and feel over whelmed. Continue to support patient and family as a unit.   Code Status/Advance Care Planning:  Full code    Symptom Management:    continue current mode of care.   Palliative Prophylaxis:   Delirium Protocol  Additional Recommendations (Limitations, Scope, Preferences):  Full Scope Treatment  Psycho-social/Spiritual:   Desire for further Chaplaincy  support:yes  Additional Recommendations: Caregiving  Support/Resources  Prognosis:   Unable to determine  Discharge Planning: To Be Determined      Primary Diagnoses: Present on Admission: . Autoimmune hepatitis ([place]) . Essential hypertension . Hyponatremia . SBP (spontaneous bacterial peritonitis) (Michael Hamilton) . Cirrhosis of liver (Michael Hamilton) . Paraproteinemia . Microcytic anemia . Acute kidney injury (Michael Hamilton) . Thrombocytopenia (Michael Hamilton) . Esophageal varices (Michael Hamilton) . Hepatitis C antibody test positive . Hepatitis B core antibody positive   I have reviewed the medical record, interviewed the patient and family, and examined the patient. The following aspects are pertinent.  Past Medical History:  Diagnosis Date  . Anemia   . Cirrhosis (Michael Hamilton)    with ascites  . Diabetes mellitus (Michael Hamilton)   . Essential hypertension   . Frequent headaches   . Rotator cuff arthropathy of right shoulder    Social History   Socioeconomic History  . Marital status: Married    Spouse name: Not on file  . Number of children: Not on file  . Years of education: Not on file  . Highest education level: Not on file  Occupational History  . Not on file  Social Needs  . Financial resource strain: Not on file  . Food insecurity:    Worry: Not on file    Inability: Not on file  . Transportation needs:    Medical: Not on file    Non-medical: Not on file  Tobacco Use  . Smoking status: Former Smoker    Years: 2.00  . Smokeless tobacco: Never Used  . Tobacco comment: quit when he was 21  Substance and Sexual Activity  . Alcohol use: No  . Drug use: No  . Sexual activity: Yes    Birth control/protection: None  Lifestyle  . Physical activity:    Days per week: Not on file    Minutes per session: Not on file  . Stress: Not on file  Relationships  . Social connections:    Talks on phone: Not on file    Gets together: Not on file    Attends religious service: Not on file    Active member of club or  organization: Not on file    Attends meetings of clubs or organizations: Not on file    Relationship status: Not on file  Other Topics Concern  . Not on file  Social History Narrative   Works for [employer]    Family History  Problem Relation Age of Onset  . Diabetes Neg Hx   . Colon cancer Neg Hx   . Esophageal cancer Neg Hx    Scheduled Meds: . Chlorhexidine Gluconate Cloth  6 each Topical Q0600  . [START ON Jan 23, 2019] entecavir  0.5 mg Oral Q72H  . feeding supplement  1 Container Oral BID BM  . feeding supplement (PRO-STAT SUGAR FREE 64)  30  mL Oral BID  . insulin aspart  0-9 Units Subcutaneous TID WC  . lactulose  300 mL Rectal BID  . midodrine  10 mg Oral TID  . pantoprazole  40 mg Oral Daily  . sodium chloride flush  10-40 mL Intracatheter Q12H  . sodium zirconium cyclosilicate  10 g Oral Daily   Continuous Infusions: . albumin human    . dextrose 5 % and 0.9% NaCl 75 mL/hr at 12/30/18 0828  . meropenem (MERREM) IV Stopped (12/30/18 0515)  . octreotide  (SANDOSTATIN)    IV infusion 50 mcg/hr (12/30/18 1025)   PRN Meds:.acetaminophen **OR** acetaminophen, albumin human, HYDROmorphone (DILAUDID) injection, morphine, ondansetron **OR** ondansetron (ZOFRAN) IV, sodium chloride flush Medications Prior to Admission:  Prior to Admission medications   Medication Sig Start Date End Date Taking? Authorizing Provider  entecavir (BARACLUDE) 0.5 MG tablet Take 1 tablet (0.5 mg total) by mouth daily. 11/18/18  Yes Armbruster, Carlota Raspberry, MD  furosemide (LASIX) 20 MG tablet Take 1 tablet (20 mg total) by mouth daily. 10/06/18  Yes Armbruster, Carlota Raspberry, MD  Insulin Glargine (LANTUS SOLOSTAR) 100 UNIT/ML Solostar Pen Inject 15 Units into the skin daily at 10 pm. 12/23/18  Yes Isabelle Course, MD  METFORMIN HCL PO Take 1 tablet by mouth 2 (two) times a day.   Yes [provider]  morphine (MSIR) 15 MG tablet Take 1 tablet (15 mg total) by mouth every 4 (four) hours as needed for  severe pain. 12/13/18  Yes Deno Etienne, DO  omeprazole (PRILOSEC) 40 MG capsule Take 1 capsule (40 mg total) by mouth 2 (two) times daily. 11/10/18  Yes Armbruster, Carlota Raspberry, MD  ondansetron (ZOFRAN ODT) 4 MG disintegrating tablet Take 1 tablet (4 mg total) by mouth every 8 (eight) hours as needed for nausea or vomiting. 12/13/18  Yes Deno Etienne, DO  polyethylene glycol [facility]) packet Take 17 g by mouth 2 (two) times daily. 08/21/18  Yes Santos-Sanchez, Merlene Morse, MD  propranolol (INDERAL) 20 MG tablet Take 1 tablet (20 mg total) by mouth 2 (two) times daily. 11/05/18  Yes Armbruster, Carlota Raspberry, MD  spironolactone (ALDACTONE) 50 MG tablet Take 1 tablet (50 mg total) by mouth daily. 10/06/18  Yes Armbruster, Carlota Raspberry, MD  glucose blood (ONETOUCH VERIO) test strip Test blood sugars as directed. Dx: E11.9 09/11/16   Dorothyann Peng, NP  Insulin Pen Needle 31G X 5 MM MISC Use 1 time daily with injections 07/31/18   [name], MD  ONE TOUCH LANCETS MISC Test blood sugars as directed. Dx: E11.9 09/11/16   Dorothyann Peng, NP  propranolol (INDERAL) 20 MG/5ML solution Take 5 mLs (20 mg total) by mouth 2 (two) times a day. Patient not taking: Reported on 01/11/2019 11/06/18   Armbruster, Carlota Raspberry, MD  senna-docusate (SENOKOT-S) 8.6-50 MG tablet Take 2 tablets by mouth 2 (two) times daily. Patient not taking: Reported on 01/09/2019 08/21/18   [name], MD   No Known Allergies Review of Systems Non verbal  Physical Exam Chronically ill appearing Confused Pulling on his mittens Does not open eyes Does not follow commands Not making urine in foley bag Abdomen is distended and firm Has edema S1 S2 Regular pattern of breathing  Vital Signs: BP (!) 106/49   Pulse (!) 103   Temp (!) 97.4 F (36.3 C) (Axillary)   Resp (!) 26   Ht 5\' 5"  (1.651 m)   Wt 71.4 kg   SpO2 95%   BMI 26.19 kg/m  Pain Scale:  0-10   Pain Score: Asleep   SpO2: SpO2: 95 % O2 Device:SpO2: 95 % O2 Flow Rate: .O2  Flow Rate (L/min): 2 L/min  IO: Intake/output summary:   Intake/Output Summary (Last 24 hours) at 12/30/2018 1320 Last data filed at 12/30/2018 1155 Gross per 24 hour  Intake 775 ml  Output 451 ml  Net 324 ml    LBM: Last BM Date: 12/30/18 Baseline Weight: Weight: 65.6 kg Most recent weight: Weight: 71.4 kg     Palliative Assessment/Data:   Flowsheet Rows     Most Recent Value  Intake Tab  Referral Department  Hospitalist  Unit at Time of Referral  ICU  Palliative Care Primary Diagnosis  Nephrology  Palliative Care Type  New Palliative care  Reason for referral  Clarify Goals of Care  Date first seen by Palliative Care  12/30/18  Clinical Assessment  Palliative Performance Scale Score  20%  Pain Max last 24 hours  4  Pain Min Last 24 hours  3  Dyspnea Max Last 24 Hours  4  Dyspnea Min Last 24 hours  3  Nausea Max Last 24 Hours  4  Nausea Min Last 24 Hours  3  Anxiety Max Last 24 Hours  4  Anxiety Min Last 24 Hours  3  Psychosocial & Spiritual Assessment  Palliative Care Outcomes  Patient/Family meeting held?  Yes  Who was at the meeting?  patient and daughter at the bedside       Time In:  10 Time Out:  11 Time Total:  60 min  Greater than 50%  of this time was spent counseling and coordinating care related to the above assessment and plan.  Signed by: Loistine Chance, MD [phone]   Please contact Palliative Medicine Team phone at [phone] for questions and concerns.  For individual provider: See Shea Evans

## 2018-12-30 NOTE — Progress Notes (Signed)
Pt transported to CT via ventilator with no complications noted. Pt returned to [place].

## 2018-12-30 NOTE — Progress Notes (Signed)
[facility] Progress Note  Subjective: lethargic and confused this am, pulled NG out, UOP down and creat up 5.0  Vitals:   12/30/18 0546 12/30/18 0600 12/30/18 0654 12/30/18 0700  BP:  104/76    Pulse: 99 98  (!) 113  Resp: (!) 21 18  (!) 21  Temp: (!) 95.7 F (35.4 C)  (!) 97.2 F (36.2 C) (!) 97.4 F (36.3 C)  TempSrc: Rectal  Rectal Axillary  SpO2: 94% 91%  92%  Weight:      Height:        Inpatient medications: . Chlorhexidine Gluconate Cloth  6 each Topical Daily  . [START ON 2019/01/12] entecavir  0.5 mg Oral Q72H  . feeding supplement  1 Container Oral BID BM  . feeding supplement (PRO-STAT SUGAR FREE 64)  30 mL Oral BID  . insulin aspart  0-9 Units Subcutaneous TID WC  . insulin aspart  4 Units Subcutaneous TID WC  . insulin glargine  10 Units Subcutaneous Q2200  . lactulose  300 mL Rectal BID  . midodrine  10 mg Oral TID  . pantoprazole  40 mg Oral Daily  . sodium chloride flush  10-40 mL Intracatheter Q12H  . sodium zirconium cyclosilicate  10 g Oral Daily   . albumin human    . meropenem (MERREM) IV Stopped (12/30/18 0515)  . octreotide  (SANDOSTATIN)    IV infusion 50 mcg/hr (12/29/18 2230)   acetaminophen **OR** acetaminophen, albumin human, HYDROmorphone (DILAUDID) injection, morphine, ondansetron **OR** ondansetron (ZOFRAN) IV, sodium chloride flush    Exam: Alert, no oxgyen, calm   no jvd Chest cta bilat  Cor reg no RG  Abd 2-3+ ascites, nontender  Ext no sig edema  NF, ox 3   UA neg  UNa 6/7 <10  UCr 151  CXR 6/5 - low vol xray, +atx  Renal US > IMPRESSION: Normal size kidneys without hydronephrosis. Mild increased cortical echogenicity which can be seen with medical renal disease   Assessment/Plan: 1.  Acute kidney injury: due to hepatorenal syndrome. Worsening today w/ signs of uremia (lethargy, confusion), will need HD [facility].  Have d/w primary for transfer. Will dc octreotide and IV albumin, cont midodrine.  2.  Vol  excess: no worse or better, no resp issues 3.  Cirrhosis/autoimmune hepatitis/ ascites: severe cirrhosis on prednisone and prophylactic entecavir.  Attempting to transfer pt for liver transplantation but insurance is an issue.  Please limit all paracenteses to 2 L max due to AKI for now 4.  Hyponatremia: better on fluid restriction 800 cc/ day.    [facility] 12/30/2018, 8:08 AM  Iron/TIBC/Ferritin/ %Sat    Component Value Date/Time   IRON 51 12/28/2018 0911   IRON 110 08/04/2018 0947   TIBC NOT CALCULATED 12/28/2018 0911   TIBC 225 (L) 08/04/2018 0947   FERRITIN 907 (H) 12/28/2018 0911   IRONPCTSAT NOT CALCULATED 12/28/2018 0911   IRONPCTSAT 49 08/04/2018 0947   Recent Labs  Lab 12/28/18 0250  12/30/18 0438 12/30/18 0439  NA 121*   < > 128*  --   K 5.9*   < > 4.8  --   CL 87*   < > 88*  --   CO2 21*   < > 20*  --   GLUCOSE 365*   < > 42*  --   BUN 67*   < > 97*  --   CREATININE 3.82*   < > 5.09*  --   CALCIUM  7.6*   < > 8.9  --   PHOS 7.0*  --   --   --   ALBUMIN 3.1*  3.1*   < > 2.9*  --   INR  --    < >  --  5.9*   < > = values in this interval not displayed.   Recent Labs  Lab 12/30/18 0438  AST 297*  ALT 235*  ALKPHOS 107  BILITOT 34.9*  PROT 6.7   Recent Labs  Lab 12/30/18 0439  WBC 27.3*  HGB 11.8*  HCT 35.4*  PLT 159

## 2018-12-30 NOTE — Progress Notes (Signed)
CRITICAL VALUE ALERT  Critical Value:  Lactic Acid 10.9  Date & Time Notied:  12/30/2018, 1022  Provider Notified: [place] MD  Orders Received/Actions taken: Awaiting orders

## 2018-12-30 NOTE — Progress Notes (Signed)
Late Entry  45 minutes into HD, BP dropped into 60, agonal breathing. 1300 ml NSL given, breathing still remained agonal. Code called,  Patient intubated by anesthesia with out difficulty and transferred to 2 H

## 2018-12-30 NOTE — Progress Notes (Signed)
PROGRESS NOTE    Michael Hamilton  VWU:[id number] DOB: [birth year] DOA: 12/27/2018 PCP: [name], MD    Brief Narrative:   58 year old male who presented with worsening abdominal pain and ascites.  He does have history of autoimmune hepatitis, liver cirrhosis, thrombocytopenia, type 2 diabetes mellitus and hypertension.  Patient reported worsening abdominal pain for about 7 days, associated with increased abdominal girth, nausea, low-grade fever and orthopnea.  On his initial physical examination his blood pressure was 117/79, respiratory rate 18, pulse rate 102, temperature 99.4, oxygen saturation 99% on room air.  His lungs were clear to auscultation bilaterally, heart S1-S2 present and rhythmic, his abdomen was soft, tender to palpation, voluntary guarding.  No lower extremity edema.  Sodium 122, potassium 4.5, chloride 90, bicarb 22, glucose 287, BUN 24, creatinine 1.24, white count 26.9, hemoglobin 9.7, hematocrit 28.8, platelets 75, NR 2.0.  SARS COVID-19 negative.  Urinalysis negative for infection.  CT of the abdomen with cirrhosis, associated splenomegaly, large abdominal and pelvic ascites, trace bilateral pleural effusions. Chest radiograph hypoinflated, no infiltrates.  EKG 97 bpm, right axis deviation, normal intervals, sinus rhythm, no ST segment or T wave changes.  In the emergency department patient was found had low-grade fever of 99.4, tachycardia with heart rate of 102, sodium of 122, bicarb 22, BUN 24, creatinine 1.2, white blood cell count of 26,000 with elevated lactic acid of 6.4.  Patient was started on IV Rocephin on admission.  Patient's liver function significantly worsening with elevated total bilirubin of 22, INR 4.1, worsening of the hyponatremia WBC count 37.8.  Patient underwent paracentesis with drainage of 2.7 L of fluid suggestive of spontaneous bacterial peritonitis with the fluid WBC 10,000.  Cultures were positive for Klebsiella pneumoniae, E. coli resistant.   Infectious diseases were consulted, started the patient on meropenem.  Patient's kidney function continued to worsen with elevated BUN and creatinine of 4.84 with a potassium of 6.2.  Patient is transferred to [facility] for continued monitoring and hemodialysis.  Patient is intubated, admitted to critical care unit.    Assessment & Plan:   Principal Problem:   SBP (spontaneous bacterial peritonitis) ([place]) Active Problems:   Diabetes mellitus without complication (HCC)   Essential hypertension   Autoimmune hepatitis ([place])   Microcytic anemia   Paraproteinemia   Hyponatremia   Cirrhosis of liver (HCC)   Acute kidney injury (HCC)   Thrombocytopenia (HCC)   Esophageal varices (HCC)   Hepatitis C antibody test positive   Hepatitis B core antibody positive   Bowel obstruction (HCC)   Palliative care by specialist   Goals of care, counseling/discussion   ##Acute on chronic renal failure -Hepatorenal syndrome -Nephrology is following up with the patient -Plan for hemodialysis after dialysis catheter placed  ##Spontaneous bacterial peritonitis -Cultures grew E. coli, Klebsiella multidrug-resistant -Currently on meropenem -Repeat paracentesis still shows WBC greater than 10,000  ##Fulminant hepatic failure -From decompensated cirrhosis -Patient is a total bilirubin, INR trended up -Could be from spontaneous bacterial peritonitis -Continue to have worsening of the total bilirubin.  ##Severe sepsis/septic shock present on admission -Secondary to SBP -Currently on meropenem -Appreciate ID follow-up  ##Hepatic encephalopathy -2/2 spontaneous bacterial peritonitis, uremia -Continue with the lactulose  ##Autoimmune hepatitis -GI is following up with the patient -Patient was on prednisone and entecavir  ##Small bowel obstruction/ileus -NG tube placed  ##Type 2 diabetes mellitus -Follow-up on sliding scale insulin  ##Decompensated cirrhosis Presenting with  progressive abdominal discomfort, 7 days prior to admission.  Also  noted increased abdominal girth.  History of autoimmune hepatitis/cirrhosis with coagulopathy treated with prednisone and entecavir (to prevent reactivation of Hep B).  He continued with progressive confusion and encephalopathy.  Underwent paracentesis 6/5 with 2.7L removed. Ascitic fluid culture positive for Klebsiella pneumonia and E. coli.  Blood cultures x2 negative x5 days. --[facility] and ID following; appreciate assistance --AST 395-->212-->188-->297 --ALT 425-->266-->241-->235 --Tbili 22.5-->22.9-->26.9-->34.9 --INR 2.0-->4.1-->4.8-->4.1-->5.9 --WBC 37.8-->31.2-->21.4-->27.3 --Continue meropenem per ID and GI --Will repeat IV vit K 10mg  today --Daily CMP/CBC, PT/INR --GI working on possible transfer to [facility] for consideration of liver transplant. --Awaiting emergency Medicaid application  ##Hyponatremia -Hypervolemic hyponatremia, renal failure, fluid resuscitation -Continue to treat underlying conditions and follow-up    ##T2DM Hemoglobin A1c 8.0 on 01/19/2019.  Patient has had several hypoglycemic events over the past 24 hours.  Etiology likely inadequate oral intake coupled with his sepsis, and SBO as above. --Discontinued scheduled NovoLog and Lantus --Start D5NS --Continue to monitor blood glucose closely  ##Hyperkalemia -Patient was given temporizing measures at [facility] -Patient will be receiving dialysis after dialysis catheter is placed  ##Anemia of chronic disease -Closely follow-up with CBC -Transfuse for hemoglobin less than 8    DVT prophylaxis: SCDs Code Status: Full code Family Communication: No family member present at bedside Disposition Plan: Pending clinical improvement  Consultants:   [place] [facility]  Nephrology  Infectious disease  Palliative care  Procedures:   Paracentesis  Right IJ hemodialysis catheter  Antimicrobials:  Meropenem   Subjective:  Patient is seen after patient was transferred to [facility] from [place] [facility].  Patient is intubated, unresponsive.  Patient is also being evaluated by critical care physician  Objective: Vitals:   12/30/18 1543 12/30/18 1545 12/30/18 1548 12/30/18 1619  BP: 100/63 (!) 83/54 (!) 85/45   Pulse:      Resp:      Temp:      TempSrc:      SpO2:    99%  Weight:      Height:    5\' 5"  (1.651 m)    Intake/Output Summary (Last 24 hours) at 12/30/2018 1642 Last data filed at 12/30/2018 1155 Gross per 24 hour  Intake 775 ml  Output 376 ml  Net 399 ml   Filed Weights   12/27/18 1710 12/29/18 0700 12/30/18 0422  Weight: 65.6 kg 70.8 kg 71.4 kg    Examination:  General exam: Chronically ill in appearance, altered Respiratory system: Clear to auscultation. Respiratory effort normal. Cardiovascular system: S1 & S2 heard, RRR. No JVD, murmurs, rubs, gallops or clicks. 2+ pitting edema bilateral lower extremities Gastrointestinal system: Abdomen is distended/tense.  Tenderness to palpation, no organomegaly or masses felt.  Faint to absent bowel sounds. Central nervous system: Somnolent, altered, asterixis Extremities: No joint deformities Skin: No rashes Psychiatry: Altered, confused    Data Reviewed: I have personally reviewed following labs and imaging studies  CBC: Recent Labs  Lab 01/04/2019 0643 12/26/18 0718 12/27/18 0602 12/28/18 0911 12/29/18 0940 12/30/18 0439  WBC 26.9* 37.8* 31.2*  --  21.4* 27.3*  NEUTROABS 23.0*  --   --   --   --  23.4*  HGB 9.7* 9.7* 10.1*  --  10.5* 11.8*  HCT 28.8* 30.0* 31.7* 27.4* 31.0* 35.4*  MCV 76.2* 77.7* 77.3*  --  75.6* 76.6*  PLT 75* 127* 139*  --  120* 027   Basic Metabolic Panel: Recent Labs  Lab 12/27/18 1659 12/28/18 0250 12/28/18 0911 12/29/18 0223 12/30/18 0438  NA 124* 121* 123*  125* 128*  K 5.6* 5.9* 5.9* 4.6 4.8  CL 86* 87* 86* 86* 88*  CO2 24 21* 24 25 20*  GLUCOSE 467* 365* 383* 201* 42*  BUN 62* 67* 70* 80*  97*  CREATININE 3.61* 3.82* 3.89* 3.83* 5.09*  CALCIUM 7.8* 7.6* 7.8* 7.9* 8.9  PHOS  --  7.0*  --   --   --    GFR: Estimated Creatinine Clearance: 13.6 mL/min (A) (by C-G formula based on SCr of 5.09 mg/dL (H)). Liver Function Tests: Recent Labs  Lab 01/02/2019 8366 01/16/2019 1539 12/27/18 0602 12/28/18 0250 12/29/18 0223 12/30/18 0438  AST 27  --  395* 212* 188* 297*  ALT 29  --  425* 266* 241* 235*  ALKPHOS 53  --  102 96 110 107  BILITOT 9.5* 8.4* 22.5* 22.9* 26.9* 34.9*  PROT 7.1  --  6.3* 5.9* 6.5 6.7  ALBUMIN 2.1*  --  2.9* 3.1*   3.1* 3.0* 2.9*   Recent Labs  Lab 12/28/2018 0643  LIPASE 38   Recent Labs  Lab 12/27/18 1946 12/30/18 0948  AMMONIA 71* 52*   Coagulation Profile: Recent Labs  Lab 01/06/2019 0643 12/27/18 0602 12/28/18 1225 12/29/18 0940 12/30/18 0439  INR 2.0* 4.1* 4.8* 4.1* 5.9*   Cardiac Enzymes: Recent Labs  Lab 01/11/2019 0644  TROPONINI <0.03   BNP (last 3 results) No results for input(s): PROBNP in the last 8760 hours. HbA1C: No results for input(s): HGBA1C in the last 72 hours. CBG: Recent Labs  Lab 12/30/18 0504 12/30/18 0659 12/30/18 0739 12/30/18 0900 12/30/18 1112  GLUCAP 180* 109* 74 74 81   Lipid Profile: No results for input(s): CHOL, HDL, LDLCALC, TRIG, CHOLHDL, LDLDIRECT in the last 72 hours. Thyroid Function Tests: No results for input(s): TSH, T4TOTAL, FREET4, T3FREE, THYROIDAB in the last 72 hours. Anemia Panel: Recent Labs    12/28/18 0911  VITAMINB12 2,203*  FERRITIN 907*  TIBC NOT CALCULATED  IRON 51   Sepsis Labs: Recent Labs  Lab 01/08/2019 0644 12/30/2018 1003 12/30/18 0948  LATICACIDVEN 6.4* 5.1* 10.9*    Recent Results (from the past 294 hour(s))  Helicobacter pylori special antigen     Status: None   Collection Time: 12/24/18  2:23 PM  Result Value Ref Range Status   MICRO NUMBER: [id number]  Final   SPECIMEN QUALITY Adequate  Final   SOURCE: STOOL  Final   STATUS: FINAL  Final   RESULT:    Final    Not Detected  Antimicrobials, proton pump inhibitors, and bismuth preparations inhibit H. pylori and ingestion up to two weeks prior to testing may cause false negative results. If clinically indicated the test should be repeated on a new specimen  obtained two weeks after discontinuing treatment.   SARS Coronavirus 2 (CEPHEID - Performed in [facility] hospital lab), Hosp Order     Status: None   Collection Time: 01/06/2019  6:42 AM  Result Value Ref Range Status   SARS Coronavirus 2 NEGATIVE NEGATIVE Final    Comment: (NOTE) If result is NEGATIVE SARS-CoV-2 target nucleic acids are NOT DETECTED. The SARS-CoV-2 RNA is generally detectable in upper and lower  respiratory specimens during the acute phase of infection. The lowest  concentration of SARS-CoV-2 viral copies this assay can detect is 250  copies / mL. A negative result does not preclude SARS-CoV-2 infection  and should not be used as the sole basis for treatment or other  patient management decisions.  A negative result may occur  with  improper specimen collection / handling, submission of specimen other  than nasopharyngeal swab, presence of viral mutation(s) within the  areas targeted by this assay, and inadequate number of viral copies  (<250 copies / mL). A negative result must be combined with clinical  observations, patient history, and epidemiological information. If result is POSITIVE SARS-CoV-2 target nucleic acids are DETECTED. The SARS-CoV-2 RNA is generally detectable in upper and lower  respiratory specimens dur ing the acute phase of infection.  Positive  results are indicative of active infection with SARS-CoV-2.  Clinical  correlation with patient history and other diagnostic information is  necessary to determine patient infection status.  Positive results do  not rule out bacterial infection or co-infection with other viruses. If result is PRESUMPTIVE POSTIVE SARS-CoV-2 nucleic acids MAY BE PRESENT.    A presumptive positive result was obtained on the submitted specimen  and confirmed on repeat testing.  While 2019 novel coronavirus  (SARS-CoV-2) nucleic acids may be present in the submitted sample  additional confirmatory testing may be necessary for epidemiological  and / or clinical management purposes  to differentiate between  SARS-CoV-2 and other Sarbecovirus currently known to infect humans.  If clinically indicated additional testing with an alternate test  methodology [phone]) is advised. The SARS-CoV-2 RNA is generally  detectable in upper and lower respiratory sp ecimens during the acute  phase of infection. The expected result is Negative. Fact Sheet for Patients:  StrictlyIdeas.no Fact Sheet for Healthcare Providers: BankingDealers.co.za This test is not yet approved or cleared by the [place] FDA and has been authorized for detection and/or diagnosis of SARS-CoV-2 by FDA under an Emergency Use Authorization (EUA).  This EUA will remain in effect (meaning this test can be used) for the duration of the COVID-19 declaration under Section 564(b)(1) of the Act, 21 U.S.C. section 360bbb-3(b)(1), unless the authorization is terminated or revoked sooner. Performed at [facility], [place] [address] [place], [place] [address]   Culture, blood (routine x 2)     Status: None   Collection Time: 12/23/2018  7:40 AM  Result Value Ref Range Status   Specimen Description   Final    BLOOD LEFT HAND Performed at [facility], [place] [address], [place], [place] [address]    Special Requests   Final    BOTTLES DRAWN AEROBIC AND ANAEROBIC Blood Culture adequate volume Performed at [place] [address] [place], [place] [address]    Culture   Final    NO GROWTH 5 DAYS Performed at [facility], [place] [address] [place], [place] [address]    Report Status 12/30/2018 FINAL  Final    Culture, blood (routine x 2)     Status: None   Collection Time: 01/13/2019  7:50 AM  Result Value Ref Range Status   Specimen Description   Final    BLOOD BLOOD LEFT WRIST Performed at [facility] [address], [place], [place] [address]    Special Requests   Final    BOTTLES DRAWN AEROBIC AND ANAEROBIC Blood Culture results may not be optimal due to an excessive volume of blood received in culture bottles Performed at [place] [address] [place], [place] [address]    Culture   Final    NO GROWTH 5 DAYS Performed at [facility], [place] [address], [place],  [address]    Report Status 12/30/2018 FINAL  Final  Culture, body fluid-bottle     Status: Abnormal   Collection Time: 01/17/2019 12:44 PM  Result Value Ref Range Status   Specimen Description PERITONEAL  Final   Special Requests NONE  Final   Gram Stain   Final    GRAM NEGATIVE RODS IN BOTH AEROBIC AND ANAEROBIC BOTTLES CRITICAL RESULT CALLED TO, READ BACK BY AND VERIFIED WITH: JOHNSON AT [facility] [id number] BY SJW Performed at [facility], [place] [address], [place], [place] [address]    Culture KLEBSIELLA PNEUMONIAE ESCHERICHIA COLI  (A)  Final   Report Status 12/30/2018 FINAL  Final   Organism ID, Bacteria KLEBSIELLA PNEUMONIAE  Final   Organism ID, Bacteria ESCHERICHIA COLI  Final      Susceptibility   Escherichia coli - MIC*    AMPICILLIN >=32 RESISTANT Resistant     CEFAZOLIN <=4 SENSITIVE Sensitive     CEFEPIME <=1 SENSITIVE Sensitive     CEFTAZIDIME <=1 SENSITIVE Sensitive     CEFTRIAXONE <=1 SENSITIVE Sensitive     CIPROFLOXACIN >=4 RESISTANT Resistant     GENTAMICIN >=16 RESISTANT Resistant     IMIPENEM <=0.25 SENSITIVE Sensitive     TRIMETH/SULFA >=320 RESISTANT Resistant     AMPICILLIN/SULBACTAM >=32 RESISTANT Resistant     PIP/TAZO <=4 SENSITIVE Sensitive     Extended ESBL NEGATIVE Sensitive     * ESCHERICHIA COLI   Klebsiella pneumoniae -  MIC*    AMPICILLIN >=32 RESISTANT Resistant     CEFAZOLIN <=4 SENSITIVE Sensitive     CEFEPIME <=1 SENSITIVE Sensitive     CEFTAZIDIME <=1 SENSITIVE Sensitive     CEFTRIAXONE <=1 SENSITIVE Sensitive     CIPROFLOXACIN >=4 RESISTANT Resistant     GENTAMICIN >=16 RESISTANT Resistant     IMIPENEM <=0.25 SENSITIVE Sensitive     TRIMETH/SULFA >=320 RESISTANT Resistant     AMPICILLIN/SULBACTAM 16 INTERMEDIATE Intermediate     PIP/TAZO <=4 SENSITIVE Sensitive     Extended ESBL NEGATIVE Sensitive     * KLEBSIELLA PNEUMONIAE  Gram stain     Status: None   Collection Time: 12/28/2018 12:44 PM  Result Value Ref Range Status   Specimen Description PERITONEAL  Final   Special Requests NONE  Final   Gram Stain   Final    ABUNDANT WBC PRESENT,BOTH PMN AND MONONUCLEAR NO ORGANISMS SEEN Performed at [facility], [address] [place], [place] [address]    Report Status 01/09/2019 FINAL  Final  Urine culture     Status: Abnormal   Collection Time: 12/28/2018  1:12 PM  Result Value Ref Range Status   Specimen Description   Final    URINE, RANDOM Performed at [facility] [address] [place], [place] [address]    Special Requests   Final    NONE Performed at [facility], [place] [address] [place], [place] [address]    Culture MULTIPLE SPECIES PRESENT, SUGGEST RECOLLECTION (A)  Final   Report Status 12/27/2018 FINAL  Final  Culture, body fluid-bottle     Status: None (Preliminary result)   Collection Time: 12/27/18 12:25 PM  Result Value Ref Range Status   Specimen Description PERITONEAL FLUID  Final   Special Requests BOTTLES DRAWN AEROBIC AND ANAEROBIC  Final   Culture   Final    NO GROWTH 3 DAYS Performed at [facility], [place] [address], [place], [place] [address]    Report Status PENDING  Incomplete  Gram stain  Status: None   Collection Time: 12/27/18 12:25 PM  Result Value Ref Range Status   Specimen Description PERITONEAL  FLUID  Final   Special Requests NONE  Final   Gram Stain   Final    WBC PRESENT, PREDOMINANTLY PMN NO ORGANISMS SEEN CYTOSPIN SMEAR Performed at [facility], [place] [address], [place], [place] [address]    Report Status 12/28/2018 FINAL  Final  MRSA PCR Screening     Status: None   Collection Time: 12/27/18  2:17 PM  Result Value Ref Range Status   MRSA by PCR NEGATIVE NEGATIVE Final    Comment:        The GeneXpert MRSA Assay (FDA approved for NASAL specimens only), is one component of a comprehensive MRSA colonization surveillance program. It is not intended to diagnose MRSA infection nor to guide or monitor treatment for MRSA infections. Performed at [facility], [place] [address] [place], [place] [address]          Radiology Studies: Dg Abd 1 View  Result Date: 12/30/2018 CLINICAL DATA:  NG tube placement. EXAM: ABDOMEN - 1 VIEW COMPARISON:  December 30, 2018 FINDINGS: The NG tube terminates with the side port and distal tip within the stomach. Continued small bowel obstruction. IMPRESSION: The distal tip and side port of the NG tube are within the stomach, in good position. Continued small bowel obstruction. Electronically Signed   By: Dorise Bullion III M.D   On: 12/30/2018 12:18   Dg Abd 1 View  Result Date: 12/30/2018 CLINICAL DATA:  Small bowel obstruction. EXAM: ABDOMEN - 1 VIEW COMPARISON:  12/29/2018 FINDINGS: An enteric tube is no longer seen. Moderate gaseous dilatation of multiple small bowel loops is similar to the prior study. Bulging flanks and hazy abdominal density are consistent with known ascites. No gross intraperitoneal free air is identified on this supine study which partially excludes the left hemiabdomen. IMPRESSION: Unchanged small bowel dilatation consistent with obstruction. Electronically Signed   By: Logan Bores M.D.   On: 12/30/2018 10:16   Dg Abd 1 View  Result Date: 12/29/2018 CLINICAL DATA:  58 y.o. Male continues to  complain of abdominal pain and distention, and feels poorly in general. No bowel movement in 3 to 4 days. EXAM: ABDOMEN - 1 VIEW COMPARISON:  CT of the abdomen and pelvis on 01/13/2019, abdominal plain films on 07/31/2018 FINDINGS: Interval development of significant small bowel dilatation, with bowel loops measuring up to 0.9 centimeters. The supine views performed show no free intraperitoneal air. Central location of air-filled bowel loops consistent with known ascites. IMPRESSION: Interval development of high grade small bowel obstruction. Ascites. These results will be called to the ordering clinician or representative by the Radiologist Assistant, and communication documented in the PACS or zVision Dashboard. Electronically Signed   By: Nolon Nations M.D.   On: 12/29/2018 12:48   Ir Fluoro Guide Cv Line Right  Result Date: 12/29/2018 CLINICAL DATA:  Acute kidney injury, hepatic failure and need for non tunneled hemodialysis catheter for potential emergent hemodialysis. EXAM: NON-TUNNELED CENTRAL VENOUS CATHETER PLACEMENT WITH ULTRASOUND AND FLUOROSCOPIC GUIDANCE FLUOROSCOPY TIME:  36 seconds.  5.7 mGy. PROCEDURE: The procedure, risks, benefits, and alternatives were explained to the patient's daughter. Questions regarding the procedure were encouraged and answered. The patient's daughter understands and consents to the procedure. A time-out was performed prior to initiating the procedure. Ultrasound was used to confirm patency of the right internal jugular vein. The right neck and chest were  prepped with chlorhexidine in a sterile fashion, and a sterile drape was applied covering the operative field. Maximum barrier sterile technique with sterile gowns and gloves were used for the procedure. Local anesthesia was provided with 1% lidocaine. After creating a small venotomy incision, a 21 gauge needle was advanced into the right internal jugular vein under direct, real-time ultrasound guidance. Dilatation  was performed over a guidewire. A 13 French, triple lumen Trialysis catheter measuring 15 cm was then advanced over the wire. Final catheter positioning was confirmed and documented with a fluoroscopic spot image. The catheter was aspirated, flushed with saline, and injected with appropriate volume heparin dwells. The catheter exit site was secured with 0-Prolene retention sutures. COMPLICATIONS: None.  No pneumothorax. FINDINGS: After catheter placement, the tip lies at the cavoatrial junction. The catheter aspirates normally and is ready for immediate use. IMPRESSION: Placement of non-tunneled central venous hemodialysis catheter via the right internal jugular vein. The catheter tip lies at the cavoatrial junction. The catheter is ready for immediate use. Electronically Signed   By: Aletta Edouard M.D.   On: 12/29/2018 08:27   Ir US Guide Vasc Access Right  Result Date: 12/29/2018 CLINICAL DATA:  Acute kidney injury, hepatic failure and need for non tunneled hemodialysis catheter for potential emergent hemodialysis. EXAM: NON-TUNNELED CENTRAL VENOUS CATHETER PLACEMENT WITH ULTRASOUND AND FLUOROSCOPIC GUIDANCE FLUOROSCOPY TIME:  36 seconds.  5.7 mGy. PROCEDURE: The procedure, risks, benefits, and alternatives were explained to the patient's daughter. Questions regarding the procedure were encouraged and answered. The patient's daughter understands and consents to the procedure. A time-out was performed prior to initiating the procedure. Ultrasound was used to confirm patency of the right internal jugular vein. The right neck and chest were prepped with chlorhexidine in a sterile fashion, and a sterile drape was applied covering the operative field. Maximum barrier sterile technique with sterile gowns and gloves were used for the procedure. Local anesthesia was provided with 1% lidocaine. After creating a small venotomy incision, a 21 gauge needle was advanced into the right internal jugular vein under direct,  real-time ultrasound guidance. Dilatation was performed over a guidewire. A 13 French, triple lumen Trialysis catheter measuring 15 cm was then advanced over the wire. Final catheter positioning was confirmed and documented with a fluoroscopic spot image. The catheter was aspirated, flushed with saline, and injected with appropriate volume heparin dwells. The catheter exit site was secured with 0-Prolene retention sutures. COMPLICATIONS: None.  No pneumothorax. FINDINGS: After catheter placement, the tip lies at the cavoatrial junction. The catheter aspirates normally and is ready for immediate use. IMPRESSION: Placement of non-tunneled central venous hemodialysis catheter via the right internal jugular vein. The catheter tip lies at the cavoatrial junction. The catheter is ready for immediate use. Electronically Signed   By: Aletta Edouard M.D.   On: 12/29/2018 08:27   Dg Abd Portable 1v  Result Date: 12/29/2018 CLINICAL DATA:  NG tube placement. EXAM: PORTABLE ABDOMEN - 1 VIEW COMPARISON:  Earlier film, same date FINDINGS: The NG tube tip is in the body region of the stomach. Again demonstrated are dilated small bowel loops consistent with a small bowel obstruction. No obvious free air. IMPRESSION: NG tube tip is in the body region of the stomach. Persistent small-bowel obstruction bowel gas pattern without obvious free air. Electronically Signed   By: Marijo Sanes M.D.   On: 12/29/2018 15:45        Scheduled Meds:  Chlorhexidine Gluconate Cloth  6 each Topical Q0600   [START  ON Jan 14, 2019] entecavir  0.5 mg Oral Q72H   feeding supplement  1 Container Oral BID BM   feeding supplement (PRO-STAT SUGAR FREE 64)  30 mL Oral BID   hydrocortisone sod succinate (SOLU-CORTEF) inj  50 mg Intravenous Q6H   insulin aspart  0-9 Units Subcutaneous TID WC   lactulose  300 mL Rectal BID   midodrine  10 mg Oral TID   pantoprazole  40 mg Oral Daily   rifaximin  550 mg Oral BID   sodium chloride  flush  10-40 mL Intracatheter Q12H   sodium zirconium cyclosilicate  10 g Oral Daily   zinc sulfate  220 mg Oral Daily   Continuous Infusions:  albumin human     albumin human     cefTRIAXone (ROCEPHIN)  IV     norepinephrine (LEVOPHED) Adult infusion     phytonadione (VITAMIN K) IV     vasopressin (PITRESSIN) infusion - *FOR SHOCK*       LOS: 4 days    Time spent: Critical care time 55 minutes.  Time spent reviewing previous medical, lab, radiology reports. discussed with critical care physician   [place], DO [facility] Pager [phone]  If 7PM-7AM, please contact night-coverage [email or web address] Password [facility] 12/30/2018, 4:42 PM

## 2018-12-30 NOTE — Progress Notes (Signed)
Hypoglycemic Event  CBG: 53  Treatment: D50 50 mL (25 gm)  Symptoms: None  Follow-up CBG: Time:2135 CBG Result:130  Possible Reasons for Event: Unknown  Comments/MD notified:Per protocol    Dillard Essex

## 2018-12-30 NOTE — Progress Notes (Signed)
PHARMACY NOTE:  ANTIMICROBIAL RENAL DOSAGE ADJUSTMENT  Current antimicrobial regimen includes a mismatch between antimicrobial dosage and estimated renal function.  As per policy approved by the Pharmacy & Therapeutics and Medical Executive Committees, the antimicrobial dosage will be adjusted accordingly.  Current antimicrobial dosage:  Meropenem 1g q 12 hrs  Indication: Intraabdominal infection  Renal Function:  Estimated Creatinine Clearance: 13.6 mL/min (A) (by C-G formula based on SCr of 5.09 mg/dL (H)). [x]      On intermittent HD, scheduled: []      On CRRT    Antimicrobial dosage has been changed to:  Meropenem 500 q 24 hrs   Thank you for allowing pharmacy to be a part of this patient's care.  Marguerite Olea, [facility] Clinical Pharmacist Phone [phone]  12/30/2018 7:17 PM

## 2018-12-30 NOTE — Progress Notes (Signed)
Report called to hemodialysis nurse, they wanted patient to come to hemodialysis STAT. [facility] called.

## 2018-12-30 NOTE — Progress Notes (Signed)
Patient intubated in hemodialysis. Transported to 2H10 on vent without incident.  Kathie Dike RRT

## 2018-12-30 NOTE — Progress Notes (Signed)
CRITICAL VALUE ALERT  Critical Value:  INR 5.9  Date & Time Notied:  12/30/2018 5015  Provider Notified: Vevelyn Royals, NP  Orders Received/Actions taken:

## 2018-12-30 NOTE — Clinical Social Work Note (Addendum)
CSW acknowledges consult, "Patient needs stat consult for emergency health insurance assistance for acceptance to [place]." Emailed financial counselor Toniann Ket so she can follow up on this. Asked her to email back with update.  Dayton Scrape, CSW [phone]  1:40 pm Received the following response from [place] stating that [place] from [place] completed his Medicaid application today.  Dayton Scrape, [place]

## 2018-12-30 NOTE — Progress Notes (Signed)
GI Progress Note  As obtained by the critical care team about the patient's status and hemodialysis and subsequent urgent respiratory arrest requiring intubation and max pressors. For the last hour I have been in direct communication with the patient's 2 daughters as well as the patient's wife and brother. I had a thorough discussion about the overall course of the patient's hospitalization. I also had an opportunity to speak with [facility] hepatology in regards to the update of the current situation. At this point in time the setting of his polymicrobial SBP, his progressive liver insufficiency, progressive kidney insufficiency, is likely bowel obstruction plus/minus possible ischemic bowel (in the setting of elevation in lactic acid they have significant concern about even the possibility of considering liver transplantation.  As such, even though we have not received the okay for the patient to have emergency Medicaid he would not be considered an eligible candidate for liver transplant evaluation. Unless there was something dramatically changed such that we could not offer other services or medical therapies here that could be offered a transfer to a quaternary center is not required at this point in time. The patient is gravely/acutely ill with a very high likelihood of passing away within the next 24 to 48 hours. As I saw him and spoke with his family he is on high-dose norepinephrine as well as on vasopressin. I discussed his case with the family and we have confirmed his DNR status.  However, they would like to try to remain aggressive if possible. I spoke with the patient's critical care team and we decided that if he is able to remain semi--stable, then we would proceed with ruling out an intracranial or intra-abdominal catastrophe with noncontrast head CT and noncontrast CT of the abdomen/pelvis.  If no evidence of a catastrophe and if his blood pressure is allowed then we would ask renal as to  whether CVVH could be considered for the patient.  If so then the possibility of trying to reverse underlying uremia as a cause for his symptoms would be trialed. Would benefit after his CRRT is initiated and if possible to have lactulose enemas restarted. He should be maintained on broad-spectrum antibiotics and IV meropenem even though his cultures had suggested the ability to downgrade to ceftriaxone, after speaking with the critical care team felt that he may benefit from broader spectrum antibiotics in the setting of his severe septic physiology and clinical state.  IV meropenem has been reordered. If his fibrinogen becomes less than 100 he should be given cryoprecipitate. No real goal or need for FFP as it would only add volume. NG tube is to low intermittent wall suction at this point in time. Not clear though could be considered a surgical consultation if only a SBO was found however, not clear that he would be a candidate for surgery at his current clinical state. The family is obviously upset by the current situation but they are fully understanding of his critical state. They hope that he turns around. I have updated his primary gastroenterologist as well. I will update my colleague who is covering overnight.  Justice Britain, MD [facility] Advanced [facility]oscopy Office # [phone]

## 2018-12-30 NOTE — Progress Notes (Signed)
Hypoglycemic Event  CBG: 41  Treatment: D50 50 mL (25 gm)  Symptoms: None  Follow-up CBG: SHNG:8719 CBG Result:180  Possible Reasons for Event: Inadequate meal intake  Comments/MD notified:K. Baltazar Najjar, NP notified    Ananias Pilgrim

## 2018-12-30 NOTE — Progress Notes (Signed)
CRITICAL VALUE ALERT  Critical Value:  Total Bilirubin w/ dilution 34.9  Date & Time Notied:  12/30/2018 0708  Provider Notified: Dr. [place]  Orders Received/Actions taken:

## 2018-12-30 NOTE — Anesthesia Procedure Notes (Signed)
Procedure Name: Intubation Date/Time: 12/30/2018 3:44 PM Performed by: Shirlyn Goltz, CRNA Pre-anesthesia Checklist: Patient identified, Emergency Drugs available, Suction available and Patient being monitored Patient Re-evaluated:Patient Re-evaluated prior to induction Oxygen Delivery Method: Circle system utilized Preoxygenation: Pre-oxygenation with 100% oxygen Induction Type: IV induction Ventilation: Mask ventilation without difficulty Laryngoscope Size: 4 and Glidescope Grade View: Grade I Tube type: Oral Tube size: 7.0 mm Number of attempts: 1 Airway Equipment and Method: Video-laryngoscopy Placement Confirmation: ETT inserted through vocal cords under direct vision,  CO2 detector and breath sounds checked- equal and bilateral Secured at: 23 cm Tube secured with: tube holder. Dental Injury: Teeth and Oropharynx as per pre-operative assessment

## 2018-12-31 ENCOUNTER — Inpatient Hospital Stay (HOSPITAL_COMMUNITY): Payer: Medicaid Other

## 2018-12-31 ENCOUNTER — Other Ambulatory Visit: Payer: Self-pay

## 2018-12-31 DIAGNOSIS — N19 Unspecified kidney failure: Secondary | ICD-10-CM

## 2018-12-31 DIAGNOSIS — R7989 Other specified abnormal findings of blood chemistry: Secondary | ICD-10-CM

## 2018-12-31 DIAGNOSIS — R579 Shock, unspecified: Secondary | ICD-10-CM

## 2018-12-31 DIAGNOSIS — Z9911 Dependence on respirator [ventilator] status: Secondary | ICD-10-CM

## 2018-12-31 DIAGNOSIS — A419 Sepsis, unspecified organism: Principal | ICD-10-CM

## 2018-12-31 DIAGNOSIS — J9601 Acute respiratory failure with hypoxia: Secondary | ICD-10-CM

## 2018-12-31 DIAGNOSIS — K7201 Acute and subacute hepatic failure with coma: Secondary | ICD-10-CM

## 2018-12-31 LAB — AMMONIA: Ammonia: 64 umol/L — ABNORMAL HIGH (ref 9–35)

## 2018-12-31 LAB — COMPREHENSIVE METABOLIC PANEL
ALT: 1097 U/L — ABNORMAL HIGH (ref 0–44)
AST: 3766 U/L — ABNORMAL HIGH (ref 15–41)
Albumin: 3.6 g/dL (ref 3.5–5.0)
Alkaline Phosphatase: 119 U/L (ref 38–126)
Anion gap: 26 — ABNORMAL HIGH (ref 5–15)
BUN: 66 mg/dL — ABNORMAL HIGH (ref 6–20)
CO2: 12 mmol/L — ABNORMAL LOW (ref 22–32)
Calcium: 8.8 mg/dL — ABNORMAL LOW (ref 8.9–10.3)
Chloride: 91 mmol/L — ABNORMAL LOW (ref 98–111)
Creatinine, Ser: 4.84 mg/dL — ABNORMAL HIGH (ref 0.61–1.24)
GFR calc Af Amer: 14 mL/min — ABNORMAL LOW (ref >60)
GFR calc non Af Amer: 12 mL/min — ABNORMAL LOW (ref >60)
Glucose, Bld: 122 mg/dL — ABNORMAL HIGH (ref 70–99)
Potassium: 6.2 mmol/L — ABNORMAL HIGH (ref 3.5–5.1)
Sodium: 129 mmol/L — ABNORMAL LOW (ref 135–145)
Total Bilirubin: 32.1 mg/dL (ref 0.3–1.2)
Total Protein: 6.6 g/dL (ref 6.5–8.1)

## 2018-12-31 LAB — CBC
HCT: 29.8 % — ABNORMAL LOW (ref 39.0–52.0)
Hemoglobin: 9.6 g/dL — ABNORMAL LOW (ref 13.0–17.0)
MCH: 25.5 pg — ABNORMAL LOW (ref 26.0–34.0)
MCHC: 32.2 g/dL (ref 30.0–36.0)
MCV: 79 fL — ABNORMAL LOW (ref 80.0–100.0)
Platelets: 95 10*3/uL — ABNORMAL LOW (ref 150–400)
RBC: 3.77 MIL/uL — ABNORMAL LOW (ref 4.22–5.81)
RDW: 20.1 % — ABNORMAL HIGH (ref 11.5–15.5)
WBC: 20.6 10*3/uL — ABNORMAL HIGH (ref 4.0–10.5)
nRBC: 1.6 % — ABNORMAL HIGH (ref 0.0–0.2)

## 2018-12-31 LAB — TROPONIN I
Troponin I: 0.04 ng/mL (ref <0.03)
Troponin I: 0.03 ng/mL (ref <0.03)
Troponin I: 0.04 ng/mL

## 2018-12-31 LAB — ABO/RH: ABO/RH(D): A POS

## 2018-12-31 LAB — RENAL FUNCTION PANEL
Albumin: 4 g/dL (ref 3.5–5.0)
Anion gap: 28 — ABNORMAL HIGH (ref 5–15)
BUN: 44 mg/dL — ABNORMAL HIGH (ref 6–20)
CO2: 11 mmol/L — ABNORMAL LOW (ref 22–32)
Calcium: 9 mg/dL (ref 8.9–10.3)
Chloride: 95 mmol/L — ABNORMAL LOW (ref 98–111)
Creatinine, Ser: 3.68 mg/dL — ABNORMAL HIGH (ref 0.61–1.24)
GFR calc Af Amer: 20 mL/min — ABNORMAL LOW (ref >60)
GFR calc non Af Amer: 17 mL/min — ABNORMAL LOW (ref >60)
Glucose, Bld: 83 mg/dL (ref 70–99)
Phosphorus: 9.3 mg/dL — ABNORMAL HIGH (ref 2.5–4.6)
Potassium: 5.5 mmol/L — ABNORMAL HIGH (ref 3.5–5.1)
Sodium: 134 mmol/L — ABNORMAL LOW (ref 135–145)

## 2018-12-31 LAB — FIBRINOGEN: Fibrinogen: 70 mg/dL — CL (ref 210–475)

## 2018-12-31 LAB — GLUCOSE, CAPILLARY
Glucose-Capillary: 112 mg/dL — ABNORMAL HIGH (ref 70–99)
Glucose-Capillary: 138 mg/dL — ABNORMAL HIGH (ref 70–99)
Glucose-Capillary: 54 mg/dL — ABNORMAL LOW (ref 70–99)
Glucose-Capillary: 63 mg/dL — ABNORMAL LOW (ref 70–99)
Glucose-Capillary: 95 mg/dL (ref 70–99)

## 2018-12-31 LAB — PROTIME-INR
INR: >10 (ref 0.8–1.2)
INR: 2.6 — ABNORMAL HIGH (ref 0.8–1.2)
Prothrombin Time: >90 seconds — ABNORMAL HIGH (ref 11.4–15.2)
Prothrombin Time: 27.2 s — ABNORMAL HIGH (ref 11.4–15.2)

## 2018-12-31 LAB — QUANTIFERON-TB GOLD PLUS: QuantiFERON-TB Gold Plus: UNDETERMINED — AB

## 2018-12-31 LAB — TYPE AND SCREEN
ABO/RH(D): A POS
Antibody Screen: NEGATIVE

## 2018-12-31 LAB — QUANTIFERON-TB GOLD PLUS (RQFGPL)
QuantiFERON Mitogen Value: 0.08 IU/mL
QuantiFERON Nil Value: 0.02 IU/mL
QuantiFERON TB1 Ag Value: 0.02 IU/mL
QuantiFERON TB2 Ag Value: 0.03 IU/mL

## 2018-12-31 LAB — HEPATITIS B SURFACE ANTIGEN: Hepatitis B Surface Ag: NEGATIVE

## 2018-12-31 LAB — MAGNESIUM: Magnesium: 2.8 mg/dL — ABNORMAL HIGH (ref 1.7–2.4)

## 2018-12-31 LAB — HEPATITIS B SURFACE ANTIBODY,QUALITATIVE: Hep B S Ab: NONREACTIVE

## 2018-12-31 LAB — HEPATITIS B CORE ANTIBODY, TOTAL: Hep B Core Total Ab: POSITIVE — AB

## 2018-12-31 MED ORDER — AMIODARONE LOAD VIA INFUSION
150.0000 mg | Freq: Once | INTRAVENOUS | Status: AC
  Administered 2018-12-31: 150 mg via INTRAVENOUS
  Filled 2018-12-31: qty 83.34

## 2018-12-31 MED ORDER — DEXTROSE 50 % IV SOLN
INTRAVENOUS | Status: AC
  Administered 2018-12-31: 50 mL via INTRAVENOUS
  Filled 2018-12-31: qty 50

## 2018-12-31 MED ORDER — DEXTROSE 50 % IV SOLN
INTRAVENOUS | Status: AC
  Administered 2018-12-31: 50 mL
  Filled 2018-12-31: qty 50

## 2018-12-31 MED ORDER — PRISMASOL BGK 0/2.5 32-2.5 MEQ/L IV SOLN
INTRAVENOUS | Status: DC
  Administered 2018-12-31: 08:00:00 via INTRAVENOUS_CENTRAL
  Filled 2018-12-31 (×3): qty 5000

## 2018-12-31 MED ORDER — DEXTROSE 5 % IV SOLN
INTRAVENOUS | Status: DC
  Administered 2018-12-31: 50 mL via INTRAVENOUS

## 2018-12-31 MED ORDER — AMIODARONE HCL IN DEXTROSE 360-4.14 MG/200ML-% IV SOLN
30.0000 mg/h | INTRAVENOUS | Status: DC
  Filled 2018-12-31: qty 200

## 2018-12-31 MED ORDER — DEXTROSE 50 % IV SOLN
25.0000 g | INTRAVENOUS | Status: AC
  Administered 2018-12-31: 50 mL via INTRAVENOUS

## 2018-12-31 MED ORDER — PRISMASOL BGK 0/2.5 32-2.5 MEQ/L IV SOLN
INTRAVENOUS | Status: DC
  Administered 2018-12-31: 08:00:00 via INTRAVENOUS_CENTRAL
  Filled 2018-12-31 (×2): qty 5000

## 2018-12-31 MED ORDER — SODIUM CHLORIDE 0.9% IV SOLUTION: Freq: Once | INTRAVENOUS | Status: DC

## 2018-12-31 MED ORDER — ASPIRIN 300 MG RE SUPP
150.0000 mg | Freq: Once | RECTAL | Status: AC
  Administered 2018-12-31: 150 mg via RECTAL
  Filled 2018-12-31: qty 1

## 2018-12-31 MED ORDER — ASPIRIN 300 MG RE SUPP
150.0000 mg | Freq: Every day | RECTAL | Status: DC
  Administered 2018-12-31: 150 mg via RECTAL
  Filled 2018-12-31: qty 1

## 2018-12-31 MED ORDER — AMIODARONE HCL IN DEXTROSE 360-4.14 MG/200ML-% IV SOLN
60.0000 mg/h | INTRAVENOUS | Status: DC
  Administered 2018-12-31: 60 mg/h via INTRAVENOUS

## 2018-12-31 MED ORDER — PRISMASOL BGK 0/2.5 32-2.5 MEQ/L IV SOLN
INTRAVENOUS | Status: DC
  Administered 2018-12-31 (×3): via INTRAVENOUS_CENTRAL
  Filled 2018-12-31 (×11): qty 5000

## 2018-12-31 MED FILL — Medication: Qty: 1 | Status: AC

## 2019-01-01 LAB — BPAM FFP
Blood Product Expiration Date: 202006122359
Blood Product Expiration Date: 202006122359
Blood Product Expiration Date: 202006122359
ISSUE DATE / TIME: 202006110923
ISSUE DATE / TIME: 202006111032
ISSUE DATE / TIME: 202006111206
Unit Type and Rh: 6200
Unit Type and Rh: 6200
Unit Type and Rh: 6200

## 2019-01-01 LAB — BPAM CRYOPRECIPITATE
Blood Product Expiration Date: 202006111750
Blood Product Expiration Date: 202006111750
Blood Product Expiration Date: 202006111912
ISSUE DATE / TIME: 202006111253
ISSUE DATE / TIME: 202006111404
Unit Type and Rh: 6200
Unit Type and Rh: 6200
Unit Type and Rh: 6200

## 2019-01-01 LAB — PREPARE FRESH FROZEN PLASMA
Unit division: 0
Unit division: 0

## 2019-01-01 LAB — PREPARE CRYOPRECIPITATE
Unit division: 0
Unit division: 0
Unit division: 0

## 2019-01-01 LAB — CULTURE, BODY FLUID W GRAM STAIN -BOTTLE: Culture: NO GROWTH

## 2019-01-04 ENCOUNTER — Telehealth: Payer: Self-pay | Admitting: *Deleted

## 2019-01-04 NOTE — Telephone Encounter (Signed)
Received original D/C from [facility]-D/C forwarded to [facility] for signature .

## 2019-01-05 NOTE — Telephone Encounter (Signed)
Received original D/C signed-[facility]eral home notified for pick up.  

## 2019-01-20 NOTE — Progress Notes (Signed)
CRITICAL VALUE ALERT  Critical Value:  Trop-0.04  Date & Time Notied:  2019-01-26 @0224   Provider Notified: Dr. Oletta Darter  Orders Received/Actions taken: ASA 150mg  supp X1 and daily.

## 2019-01-20 NOTE — Progress Notes (Signed)
RT note- Patient expired on ventilator and then pronounced, patient extubated and ventilator removed from room

## 2019-01-20 NOTE — Procedures (Signed)
Arterial Catheter Insertion Procedure Note Michael Hamilton [id number] [birth year]  Procedure: Insertion of Arterial Catheter  Indications: Blood pressure monitoring and Frequent blood sampling  Procedure Details Consent: Unable to obtain consent because of emergent medical necessity. Time Out: Verified patient identification, verified procedure, site/side was marked, verified correct patient position, special equipment/implants available, medications/allergies/relevent history reviewed, required imaging and test results available.  Performed  Maximum sterile technique was used including antiseptics, cap, gloves, gown, hand hygiene, mask and sheet. Skin prep: Chlorhexidine; local anesthetic administered 20 gauge catheter was inserted into right radial artery using the Seldinger technique. ULTRASOUND GUIDANCE USED: NO Evaluation Blood flow good; BP tracing good. Complications: No apparent complications.   [name] Jan 13, 2019

## 2019-01-20 NOTE — Progress Notes (Signed)
Hypoglycemic Event  CBG: 54  Treatment: D50 50 mL (25 gm)  Symptoms: None  Follow-up CBG: BPPH:4327 CBG Result:138  Possible Reasons for Event: Unknown  Comments/MD notified:per protocol    Dillard Essex

## 2019-01-20 NOTE — Progress Notes (Addendum)
Daily Rounding Note  January 03, 2019, 11:30 AM  LOS: 5 days   SUBJECTIVE:   Chief complaint:    Autoimmune hepatitis, cirrhosis, decompensated liver disease.   Now intubated for airway protection, CRRT in place.  On 2 pressors.  INR continues to climb and transaminases acutely elevated well beyond last 2 days measures.   300 ml output to NGT recorded yesterday.  2 BM's. No urine output Hypothermic to 90.9 at 0400, warming blanket in place and now at 95.7.     OBJECTIVE:         Vital signs in last 24 hours:    Temp:  [90.9 F (32.7 C)-97 F (36.1 C)] 95.2 F (35.1 C) (06/11 1120) Pulse Rate:  [76-103] 76 (06/11 1121) Resp:  [17-32] 26 (06/11 1121) BP: (61-146)/(15-113) 104/51 (06/11 1121) SpO2:  [10 %-100 %] 100 % (06/11 1121) FiO2 (%):  [50 %-100 %] 50 % (06/11 1121) Weight:  [72.3 kg] 72.3 kg (06/11 0400) Last BM Date: 12/30/18 Filed Weights   12/29/18 0700 12/30/18 0422 Jan 03, 2019 0400  Weight: 70.8 kg 71.4 kg 72.3 kg   Intubated, warming blanket and CRRT implace.   General: acutely ill, sedated.     Heart: RRR Chest: clear bil.  No labored breathing on vent Abdomen: distended, tense.  Gastric aspirate from OGT is brown bilious, maybe 15cc in container  Extremities: marked pitting edema in feet.  Limbs warm Neuro/Psych:  Unresponsive on Fentanyl drip.    Intake/Output from previous day: 06/10 0701 - 06/11 0700 In: 1979.2 [I.V.:1442; NG/GT:90; IV Piggyback:347.2] Out: -91 [Urine:7; Emesis/NG output:300]  Intake/Output this shift: Total I/O In: 1004.4 [I.V.:373.4; Blood:631] Out: 512 [Other:512]  Lab Results: Recent Labs    12/29/18 0940 12/30/18 0439 12/30/18 1835 03-Jan-2019 0452  WBC 21.4* 27.3*  --  20.6*  HGB 10.5* 11.8* 13.9 9.6*  HCT 31.0* 35.4* 41.0 29.8*  PLT 120* 159  --  95*   BMET Recent Labs    12/29/18 0223 12/30/18 0438 12/30/18 1835 01/03/19 0452  NA 125* 128* 128* 129*  K 4.6 4.8  4.5 6.2*  CL 86* 88*  --  91*  CO2 25 20*  --  12*  GLUCOSE 201* 42*  --  122*  BUN 80* 97*  --  66*  CREATININE 3.83* 5.09*  --  4.84*  CALCIUM 7.9* 8.9  --  8.8*   LFT Recent Labs    12/29/18 0223 12/30/18 0438 01-03-2019 0452  PROT 6.5 6.7 6.6  ALBUMIN 3.0* 2.9* 3.6  AST 188* 297* 3,766*  ALT 241* 235* 1,097*  ALKPHOS 110 107 119  BILITOT 26.9* 34.9* 32.1*  BILIDIR 15.6*  --   --   IBILI 11.3*  --   --    PT/INR Recent Labs    12/30/18 0439 01-03-2019 0452  LABPROT 52.0* >90.0*  INR 5.9* >10.0*   Hepatitis Panel Recent Labs    12/30/18 1507  HEPBSAG Negative    Studies/Results: Ct Abdomen Pelvis Wo Contrast  Result Date: 12/30/2018 CLINICAL DATA:  Low-grade bowel obstruction. EXAM: CT ABDOMEN AND PELVIS WITHOUT CONTRAST TECHNIQUE: Multidetector CT imaging of the abdomen and pelvis was performed following the standard protocol without IV contrast. COMPARISON:  CT scan of December 25, 2018. FINDINGS: Lower chest: Minimal right pleural effusions are noted with adjacent subsegmental atelectasis or infiltrates. Hepatobiliary: Hepatic cirrhosis is again noted. Distended gallbladder is noted without definite cholelithiasis. No definite biliary dilatation is noted. Pancreas: Unremarkable. No pancreatic ductal  dilatation or surrounding inflammatory changes. Spleen: Normal in size without focal abnormality. Adrenals/Urinary Tract: Adrenal glands and kidneys appear normal. No hydronephrosis or renal obstruction is noted. No renal or ureteral calculi are noted. Urinary bladder is decompressed secondary to Foley catheter. Stomach/Bowel: Nasogastric tube is seen entering proximal duodenum. The appendix appears normal. No colonic dilatation is noted. Mildly dilated small bowel loops are noted which most likely represent ileus, although distal small bowel obstruction cannot be excluded. Vascular/Lymphatic: Aortic atherosclerosis. No enlarged abdominal or pelvic lymph nodes. Reproductive: Prostate  is unremarkable. Other: Moderate ascites is noted. No hernia is noted. Moderate anasarca is noted as well. Musculoskeletal: No acute or significant osseous findings. IMPRESSION: Stable findings consistent with hepatic cirrhosis. Moderate ascites is noted. Mildly dilated small bowel loops are noted which most likely represent ileus, although distal small bowel obstruction cannot be excluded. Minimal right pleural effusions are noted with adjacent subsegmental atelectasis or infiltrate. Aortic Atherosclerosis (ICD10-I70.0). Electronically Signed   By: Marijo Conception M.D.   On: 12/30/2018 20:34   Dg Abd 1 View  Result Date: 01/11/19 CLINICAL DATA:  Abdominal distension EXAM: ABDOMEN - 1 VIEW COMPARISON:  12/30/18 FINDINGS: Gastric catheter is again seen in the second portion of the duodenum. Multiple air-filled loops of dilated small bowel are noted similar to that seen on prior CT examination. Changes are again consistent with at least partial small bowel obstruction. No free air is noted. IMPRESSION: Changes of small-bowel obstruction Electronically Signed   By: Inez Catalina M.D.   On: 2019/01/11 08:15   Dg Abd 1 View  Result Date: 12/30/2018 CLINICAL DATA:  NG tube placement. EXAM: ABDOMEN - 1 VIEW COMPARISON:  December 30, 2018 FINDINGS: The NG tube terminates with the side port and distal tip within the stomach. Continued small bowel obstruction. IMPRESSION: The distal tip and side port of the NG tube are within the stomach, in good position. Continued small bowel obstruction. Electronically Signed   By: Dorise Bullion III M.D   On: 12/30/2018 12:18   Dg Abd 1 View  Result Date: 12/30/2018 CLINICAL DATA:  Small bowel obstruction. EXAM: ABDOMEN - 1 VIEW COMPARISON:  12/29/2018 FINDINGS: An enteric tube is no longer seen. Moderate gaseous dilatation of multiple small bowel loops is similar to the prior study. Bulging flanks and hazy abdominal density are consistent with known ascites. No gross  intraperitoneal free air is identified on this supine study which partially excludes the left hemiabdomen. IMPRESSION: Unchanged small bowel dilatation consistent with obstruction. Electronically Signed   By: Logan Bores M.D.   On: 12/30/2018 10:16   Dg Abd 1 View  Result Date: 12/29/2018 CLINICAL DATA:  58 y.o. Male continues to complain of abdominal pain and distention, and feels poorly in general. No bowel movement in 3 to 4 days. EXAM: ABDOMEN - 1 VIEW COMPARISON:  CT of the abdomen and pelvis on 12/28/2018, abdominal plain films on 07/31/2018 FINDINGS: Interval development of significant small bowel dilatation, with bowel loops measuring up to 0.9 centimeters. The supine views performed show no free intraperitoneal air. Central location of air-filled bowel loops consistent with known ascites. IMPRESSION: Interval development of high grade small bowel obstruction. Ascites. These results will be called to the ordering clinician or representative by the Radiologist Assistant, and communication documented in the PACS or zVision Dashboard. Electronically Signed   By: Nolon Nations M.D.   On: 12/29/2018 12:48   Ct Head Wo Contrast  Result Date: 12/30/2018 CLINICAL DATA:  Inpatient. Altered level  of consciousness. Cirrhosis. EXAM: CT HEAD WITHOUT CONTRAST TECHNIQUE: Contiguous axial images were obtained from the base of the skull through the vertex without intravenous contrast. COMPARISON:  None. FINDINGS: Brain: No evidence of parenchymal hemorrhage or extra-axial fluid collection. No mass lesion, mass effect, or midline shift. No CT evidence of acute infarction. Nonspecific minimal subcortical and periventricular white matter hypodensity, most in keeping with chronic small vessel ischemic change. Cerebral volume is age appropriate. No ventriculomegaly. Vascular: No acute abnormality. Skull: No evidence of calvarial fracture. Sinuses/Orbits: Mild mucoperiosteal thickening in the ethmoidal air cells and  maxillary sinuses. No fluid levels. Other:  The mastoid air cells are unopacified. IMPRESSION: 1.  No evidence of acute intracranial abnormality. 2. Minimal chronic small vessel ischemic changes in the cerebral white matter. 3. Mild chronic appearing paranasal sinusitis. Electronically Signed   By: Ilona Sorrel M.D.   On: 12/30/2018 20:27   Dg Abd Portable 1v  Result Date: 12/29/2018 CLINICAL DATA:  NG tube placement. EXAM: PORTABLE ABDOMEN - 1 VIEW COMPARISON:  Earlier film, same date FINDINGS: The NG tube tip is in the body region of the stomach. Again demonstrated are dilated small bowel loops consistent with a small bowel obstruction. No obvious free air. IMPRESSION: NG tube tip is in the body region of the stomach. Persistent small-bowel obstruction bowel gas pattern without obvious free air. Electronically Signed   By: Marijo Sanes M.D.   On: 12/29/2018 15:45   Scheduled Meds:  sodium chloride   Intravenous Once   aspirin  150 mg Rectal Daily   chlorhexidine gluconate (MEDLINE KIT)  15 mL Mouth Rinse BID   entecavir  0.5 mg Oral Q72H   hydrocortisone sod succinate (SOLU-CORTEF) inj  50 mg Intravenous Q6H   insulin aspart  0-9 Units Subcutaneous Q4H   lactulose  300 mL Rectal BID   mouth rinse  15 mL Mouth Rinse 10 times per day   midodrine  10 mg Oral TID   pantoprazole  40 mg Oral Daily   rifaximin  550 mg Oral BID   sodium chloride flush  10-40 mL Intracatheter Q12H   sodium zirconium cyclosilicate  10 g Oral Daily   zinc sulfate  220 mg Oral Daily   Continuous Infusions:  sodium chloride Stopped (2019/01/16 1104)   sodium chloride     albumin human 400 mL/hr at Jan 16, 2019 1000   albumin human     dextrose 50 mL/hr at Jan 16, 2019 1100   fentaNYL infusion INTRAVENOUS 200 mcg/hr (01/16/2019 1104)   meropenem (MERREM) IV 1 g (01/16/2019 1104)   norepinephrine (LEVOPHED) Adult infusion 34 mcg/min (2019-01-16 1104)   phytonadione (VITAMIN K) IV 5 mg (01/16/2019 1131)    prismasol BGK 2/2.5 dialysis solution 1,500 mL/hr at 01/16/19 0827   prismasol BGK 2/2.5 replacement solution 400 mL/hr at January 16, 2019 0827   prismasol BGK 2/2.5 replacement solution 200 mL/hr at 01-16-2019 0825   vasopressin (PITRESSIN) infusion - *FOR SHOCK* 0.04 Units/min (16-Jan-2019 1104)   PRN Meds:.sodium chloride, Place/Maintain arterial line **AND** sodium chloride, acetaminophen **OR** acetaminophen, albumin human, fentaNYL, heparin, ondansetron **OR** ondansetron (ZOFRAN) IV, sodium chloride, sodium chloride flush   ASSESMENT:   *   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  Recent diagnosis AIH.  Severely decompensated cirrhosis with SBP Acutely worsening transaminases, current levels suggest element of ischemic liver injury.   *   SBP.  Polymicrobial (klebsiella, E coli) bacterial peritonitis, on Meropenem.  *   AKI with uremia, ? HRS.  Initiated CRRT 12/30/2018 and BUN/creatinine improved but recurrent hyperkalemia.     *   Partial vs total SBO.  SBO persists, despite NGT suction.   CT yesterday suggests ileus rather than mechanical obstruction.    *  Severe coagulopathy.  Worsening despite IV vitamin K for last 4 days. Low fibrinogen. Received 3 FFP this AM.    *   Encephalopathy.  Hepatic and metabolic/uremic.  On Rectal lactulose. Rifaximin started this morning.    *    Respiratory failure in setting of worsening mental status, intubated 6/10 for airway protection  *   Hyperkalemia  *    Microcytic anemia, fluctuating Hgbs.  No PRBCs to date.    *    Thrombocytopenia.    *   Hyponatremia.   PLAN   *  Ordered 10 U of cryoprecipitate per Dr M's suggestion. coags and LFTs in AM.     *  Continue max supportive care.  Pt is DNR.      *   Note RD has recommended tube feeds of vital 1.5 at 35 mL's/hour, pro-stat 60 mL twice daily when felt safe for enteric feeding.      Michael Hamilton  2019/01/17, 11:30 AM Phone [phone]

## 2019-01-20 NOTE — Progress Notes (Addendum)
eLink Physician-Brief Progress Note Patient Name: Michael Hamilton DOB: Apr 08, 1961 MRN: [id number]   Date of Service  01/23/19  HPI/Events of Note  Multiple issues: 1. K+ = 6.2 and 2. Coagulopathy - INR > 10. Patient had Vitamin K 15 mg yesterday. Patient on CRRT.  eICU Interventions  Will order: 1. FFP 3 units now. 2. Repeat INR at 12 noon.  3. Please call Nephrology with K+ result.      Intervention Category Major Interventions: Electrolyte abnormality - evaluation and management;Other:  Lysle Dingwall Jan 23, 2019, 6:38 AM

## 2019-01-20 NOTE — Progress Notes (Signed)
[facility] Progress Note  Subjective: on vent in ICU now, INR very high >10, unable to get procedures. I/O yest + 2L, no UOP. On cRRT, on 2 pressors, on vent 50% O2  Vitals:   Jan 29, 2019 1030 January 29, 2019 1045 01/29/19 1120 2019-01-29 1121  BP: (!) 104/23 (!) 98/50  (!) 104/51  Pulse:    76  Resp: (!) 26 (!) 26 (!) 26 (!) 26  Temp: (!) 94.8 F (34.9 C) (!) 94.8 F (34.9 C) (!) 95.2 F (35.1 C)   TempSrc:  Esophageal Esophageal   SpO2: 100% 100% 100% 100%  Weight:      Height:        Inpatient medications: . sodium chloride   Intravenous Once  . aspirin  150 mg Rectal Daily  . chlorhexidine gluconate (MEDLINE KIT)  15 mL Mouth Rinse BID  . entecavir  0.5 mg Oral Q72H  . hydrocortisone sod succinate (SOLU-CORTEF) inj  50 mg Intravenous Q6H  . insulin aspart  0-9 Units Subcutaneous Q4H  . lactulose  300 mL Rectal BID  . mouth rinse  15 mL Mouth Rinse 10 times per day  . midodrine  10 mg Oral TID  . pantoprazole  40 mg Oral Daily  . rifaximin  550 mg Oral BID  . sodium chloride flush  10-40 mL Intracatheter Q12H  . sodium zirconium cyclosilicate  10 g Oral Daily  . zinc sulfate  220 mg Oral Daily   . sodium chloride Stopped (01-29-19 1104)  . sodium chloride    . albumin human 400 mL/hr at 01/29/2019 1000  . albumin human    . dextrose 50 mL/hr at Jan 29, 2019 1100  . fentaNYL infusion INTRAVENOUS 200 mcg/hr (Jan 29, 2019 1104)  . meropenem (MERREM) IV 1 g (Jan 29, 2019 1104)  . norepinephrine (LEVOPHED) Adult infusion 34 mcg/min (Jan 29, 2019 1104)  . phytonadione (VITAMIN K) IV 5 mg (January 29, 2019 1131)  . prismasol BGK 2/2.5 dialysis solution 1,500 mL/hr at 29-Jan-2019 0827  . prismasol BGK 2/2.5 replacement solution 400 mL/hr at 01-29-2019 0827  . prismasol BGK 2/2.5 replacement solution 200 mL/hr at 01-29-2019 0825  . vasopressin (PITRESSIN) infusion - *FOR SHOCK* 0.04 Units/min (January 29, 2019 1104)   sodium chloride, Place/Maintain arterial line **AND** sodium chloride, acetaminophen **OR**  acetaminophen, albumin human, fentaNYL, heparin, ondansetron **OR** ondansetron (ZOFRAN) IV, sodium chloride, sodium chloride flush    Exam:  On vent ,sedated  no jvd Chest cta bilat  Cor reg no RG  Abd 2-3+ ascites  Ext 1+ leg edema  sedated on vent   UA neg  UNa 6/7 <10  UCr 151  CXR 6/5 - low vol xray, +atx  Renal US > IMPRESSION: Normal size kidneys without hydronephrosis. Mild increased cortical echogenicity which can be seen with medical renal disease   Assessment/Plan: 1.  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 Oliguric now. On CRRT d#2 today. K+ is high, have lowered K+ to 81mq / L in dialysate.  Keeping even.  2.  Shock - on 2 pressors today 3.  Cirrhosis/autoimmune hepatitis/ ascites: severe cirrhosis on prednisone and prophylactic entecavir.  Per GI not transplant candidate at this time.  4.  Hyponatremia: resolved 5.  Vol excess: mild-mod, up 7kg 6.  [facility]DR[facility] 607/04/2019 11:38 AM  Iron/TIBC/Ferritin/ %Sat    Component Value Date/Time   IRON 51 12/28/2018 0911   IRON 110 08/04/2018 0947   TIBC NOT CALCULATED 12/28/2018 0911  TIBC 225 (L) 08/04/2018 0947   FERRITIN 907 (H) 12/28/2018 0911   IRONPCTSAT NOT CALCULATED 12/28/2018 0911   IRONPCTSAT 49 08/04/2018 0947   Recent Labs  Lab 12/28/18 0250  2019-01-09 0452  NA 121*   < > 129*  K 5.9*   < > 6.2*  CL 87*   < > 91*  CO2 21*   < > 12*  GLUCOSE 365*   < > 122*  BUN 67*   < > 66*  CREATININE 3.82*   < > 4.84*  CALCIUM 7.6*   < > 8.8*  PHOS 7.0*  --   --   ALBUMIN 3.1*  3.1*   < > 3.6  INR  --    < > >10.0*   < > = values in this interval not displayed.   Recent Labs  Lab 01/09/19 0452  AST 3,766*  ALT 1,097*  ALKPHOS 119  BILITOT 32.1*  PROT 6.6   Recent Labs  Lab 2019/01/09 0452  WBC 20.6*  HGB 9.6*  HCT 29.8*  PLT 95*

## 2019-01-20 NOTE — Progress Notes (Signed)
CRITICAL VALUE ALERT  Critical Value:  Fibrinogen-70, INR>10  Date & Time Notied:  Jan 28, 2019 0605  Provider Notified: Dr. Oletta Darter  Orders Received/Actions taken: none at this time

## 2019-01-20 NOTE — Consult Note (Signed)
NAME:  Michael Hamilton, MRN:  [id number], DOB:  August 19, 1960, LOS: 5 ADMISSION DATE:  01/12/2019, CONSULTATION DATE:  12/30/18 REFERRING MD:  [place] CHIEF COMPLAINT:  abd pain   Brief History   58 year old man with autoimmune hepatitis progressed to cirrhosis presenting with SBP, HRS, and possibly SBO.  History of present illness   58 year old man with autoimmune hepatitis progressed to cirrhosis presenting with SBP, HRS, and possibly SBO vs ileus.  Undergoing workup for potential transplant but unfortunately developed progressive renal injury and started HD today.  Shortly after starting HD he became less responsive, Bps dropped and he required intubation for airway protection.  CCM has been asked to help manage patient and take over as primary.  Past Medical History  Autoimmune hepatitis Cirrhosis Type 2 DM HTN  Significant Hospital Events   12/30/18 intubation  Consults:  Nephrology ID Gastroenterology Palliative Care  Procedures:  12/30/18 intubation  Significant Diagnostic Tests:  AXR: ileus vs. SBO CT A/P: large volume ascites  Micro Data:  Ascites fluid- E coli and Klebsiella sensitive to ceftriaxone  Antimicrobials:  Ceftriaxone 6/5-6/7 Entacavir? Meropenem 6/7-present Flagyl 6/6-6/8  Interim history/subjective:  See above  Objective   Blood pressure (!) 96/51, pulse 81, temperature (!) 94.1 F (34.5 C), resp. rate (!) 26, height 5\' 5"  (1.651 m), weight 72.3 kg, SpO2 100 %.    Vent Mode: PRVC FiO2 (%):  [50 %-100 %] 50 % Set Rate:  [16 bmp-26 bmp] 26 bmp Vt Set:  [490 mL] 490 mL PEEP:  [5 cmH20-18 cmH20] 14 cmH20 Plateau Pressure:  [25 cmH20-30 cmH20] 30 cmH20   Intake/Output Summary (Last 24 hours) at 01/10/2019 3664 Last data filed at 10-Jan-2019 0800 Gross per 24 hour  Intake 2085.91 ml  Output -302 ml  Net 2387.91 ml   Filed Weights   12/29/18 0700 12/30/18 0422 01/10/19 0400  Weight: 70.8 kg 71.4 kg 72.3 kg    Examination: General: Acutely ill  appearing male, sedated but opens eyes to painful stimuli, jaundiced HENT: [place]/AT, PERRL, EOM-I and MMM, ETT in place Lungs: CTA bilaterally Cardiovascular: RRR, Nl S1/S2 and -M/R/G Abdomen: Distended, fluid wave, NT and +BS Extremities: 1+ edema bilaterally Neuro: still sedated from induction GU: foley in place with minimal dark urine  Resolved Hospital Problem list    Assessment & Plan:  #Advancing liver failure Not a candidate for transplant and LFTs are worsening - LFT in AM - D/C amiodarone - Avoid liver toxic agents - Rifaximin and entecavir for hep B  #Renal failure - CRRT - BMET in AM - Replace electrolytes as indicated  #Acute hypoxemic respiratory failure - Full vent support - Titrate O2 for sat of 88-92% - Abx as ordered - VAP prevention protocol - Hold off weaning  #Shock, multifactorial, primarily distributive related to sepsis and ongoing liver failure - Pressors as ordered  - Abx - Merrem and rifaximin  #Encephalopathy related to advancing liver failure - Lactulose - Rifaximin  #SBO vs. Ileus - NPO  #Thrombocytopenia and coagulopathy of liver disease - FFP ordered  GOC: - DNR - Needs to consider comfort measures at this point, will contact family later today  Best practice:  Diet: NPO, NGT to LIS Pain/Anxiety/Delirium protocol (if indicated): propofol and fentanyl gtt titrated to RASS 0 VAP protocol (if indicated): yes DVT prophylaxis: SCDs given thrombocytopenia GI prophylaxis: PPI Glucose control: CBG q4h Mobility: bedrest Code Status: Full for now, Icard discussing Family Communication: Icard discussing at this time Disposition:  ICU  Labs   CBC: Recent Labs  Lab 12/23/2018 0643 12/26/18 0718 12/27/18 0602 12/28/18 0911 12/29/18 0940 12/30/18 0439 12/30/18 1835 January 25, 2019 0452  WBC 26.9* 37.8* 31.2*  --  21.4* 27.3*  --  20.6*  NEUTROABS 23.0*  --   --   --   --  23.4*  --   --   HGB 9.7* 9.7* 10.1*  --  10.5* 11.8* 13.9 9.6*   HCT 28.8* 30.0* 31.7* 27.4* 31.0* 35.4* 41.0 29.8*  MCV 76.2* 77.7* 77.3*  --  75.6* 76.6*  --  79.0*  PLT 75* 127* 139*  --  120* 159  --  95*    Basic Metabolic Panel: Recent Labs  Lab 12/28/18 0250 12/28/18 0911 12/29/18 0223 12/30/18 0438 12/30/18 1835 01/25/19 0452  NA 121* 123* 125* 128* 128* 129*  K 5.9* 5.9* 4.6 4.8 4.5 6.2*  CL 87* 86* 86* 88*  --  91*  CO2 21* 24 25 20*  --  12*  GLUCOSE 365* 383* 201* 42*  --  122*  BUN 67* 70* 80* 97*  --  66*  CREATININE 3.82* 3.89* 3.83* 5.09*  --  4.84*  CALCIUM 7.6* 7.8* 7.9* 8.9  --  8.8*  MG  --   --   --   --   --  2.8*  PHOS 7.0*  --   --   --   --   --    GFR: Estimated Creatinine Clearance: 14.3 mL/min (A) (by C-G formula based on SCr of 4.84 mg/dL (H)). Recent Labs  Lab 12/27/2018 1003  12/27/18 0602 12/29/18 0940 12/30/18 0439 12/30/18 0948 12/30/18 1545 12/30/18 1627 Jan 25, 2019 0452  WBC  --    < > 31.2* 21.4* 27.3*  --   --   --  20.6*  LATICACIDVEN 5.1*  --   --   --   --  10.9* 10.4* >11.0*  --    < > = values in this interval not displayed.   Liver Function Tests: Recent Labs  Lab 12/27/18 0602 12/28/18 0250 12/29/18 0223 12/30/18 0438 01-25-2019 0452  AST 395* 212* 188* 297* 3,766*  ALT 425* 266* 241* 235* 1,097*  ALKPHOS 102 96 110 107 119  BILITOT 22.5* 22.9* 26.9* 34.9* 32.1*  PROT 6.3* 5.9* 6.5 6.7 6.6  ALBUMIN 2.9* 3.1*  3.1* 3.0* 2.9* 3.6   Recent Labs  Lab 01/17/2019 0643  LIPASE 38   Recent Labs  Lab 12/27/18 1946 12/30/18 0948 01-25-19 0536  AMMONIA 71* 52* 64*    ABG    Component Value Date/Time   PHART 7.363 12/30/2018 1835   PCO2ART 27.1 (L) 12/30/2018 1835   PO2ART 156.0 (H) 12/30/2018 1835   HCO3 15.6 (L) 12/30/2018 1835   TCO2 16 (L) 12/30/2018 1835   ACIDBASEDEF 9.0 (H) 12/30/2018 1835   O2SAT 99.0 12/30/2018 1835     Coagulation Profile: Recent Labs  Lab 12/27/18 0602 12/28/18 1225 12/29/18 0940 12/30/18 0439 25-Jan-2019 0452  INR 4.1* 4.8* 4.1* 5.9* >10.0*     Cardiac Enzymes: Recent Labs  Lab 01/12/2019 0644 01/25/19 0009 Jan 25, 2019 0536  TROPONINI <0.03 0.04* 0.03*    HbA1C: Hemoglobin A1C  Date/Time Value Ref Range Status  12/23/2018 09:06 AM 7.7 (A) 4.0 - 5.6 % Final  09/15/2018 10:55 AM 4.8 4.0 - 5.6 % Final   Hgb A1c MFr Bld  Date/Time Value Ref Range Status  12/24/2018 06:43 AM 8.0 (H) 4.8 - 5.6 % Final    Comment:    (NOTE)  Prediabetes: 5.7 - 6.4         Diabetes: >6.4         Glycemic control for adults with diabetes: <7.0   09/05/2016 09:26 AM 11.9 (H) 4.6 - 6.5 % Final    Comment:    Glycemic Control Guidelines for People with Diabetes:Non Diabetic:  <6%Goal of Therapy: <7%Additional Action Suggested:  >8%     CBG: Recent Labs  Lab 12/30/18 2112 12/30/18 2135 2019/01/05 0021 January 05, 2019 0337 01/05/19 0404  GLUCAP 53* 130* 95 54* 138*   The patient is critically ill with multiple organ systems failure and requires high complexity decision making for assessment and support, frequent evaluation and titration of therapies, application of advanced monitoring technologies and extensive interpretation of multiple databases.   Critical Care Time devoted to patient care services described in this note is  35  Minutes. This time reflects time of care of this signee Dr [name]. This critical care time does not reflect procedure time, or teaching time or supervisory time of PA/NP/Med student/Med Resident etc but could involve care discussion time.  Rush Farmer, M.D. [facility] Pulmonary/Critical Care Medicine. Pager: [phone]. After hours pager: [phone].

## 2019-01-20 NOTE — Progress Notes (Signed)
Nutrition Follow-up  RD working remotely.  DOCUMENTATION CODES:   Not applicable, suspect some degree of malnutrition but unable to confirm at this time  INTERVENTION:   - d/c Boost Breeze and Pro-stat oral nutrition supplement orders  Tube feeding recommendations once medically appropriate: - Vital 1.5 @ 35 ml/hr (840 ml/day) - Pro-stat 60 ml BID  Recommended tube feeding regimen provides 1660 kcal, 117 grams of protein, and 642 ml of H2O (99% of kcal needs, 100% of protein needs).  NUTRITION DIAGNOSIS:   Increased nutrient needs related to chronic illness as evidenced by estimated needs.  Ongoing  GOAL:   Patient will meet greater than or equal to 90% of their needs  Unmet  MONITOR:   Vent status, Skin, Weight trends, Labs, I & O's, Other (GOC)  REASON FOR ASSESSMENT:   Consult Assessment of nutrition requirement/status  ASSESSMENT:   58 year old Vietnamese-speaking male with history of autoimmune hepatitis (recently confirmed on liver biopsy) on prednisone, liver cirrhosis, thrombocytopenia, DM, and HTN. He presented to ED with worsening abdominal pain x1 week. History was obtained in the ED with the assistance of patient's daughter at the bedside who translated. Patient described the pain as diffuse, intermittent episodes, sharp and stabbing, 10/10 in the last 6 days. He also noticed abdominal bloating, nausea, and low-grade fever. Per daughter, one episode of diarrhea the night PTA. Patient had been seen in the ED on 5/24 and was diagnosed with colitis and started on Cipro and flagyl; has now finished antibiotics course. Patient was seen at the Internal Medicine clinic on 6/3 and was instructed to continue spironolactone and furosemide, belly was soft and did not need paracentesis at that time. COVID-19 negative in the ED.  Pt admitted with acute on chronic liver failure in the setting of acute spontaneous bacterial peritonitis.  6/08 - s/p non-tunneled HD catheter  placement 6/09 - NGT placed for decompression due to possible developing SBO 6/10 - pt pulled NGT, transferred to [facility] for emergent HD, intubated after becoming unresponsive during HD, CRRT initiated  Pt remains on CRRT. Per CCM note, pt to remain NPO today due to SBO vs ileus. NGT to low intermittent suction. MD to contact family later today to discuss considering comfort measures.  Weight up 15 lbs since admit. Will utilized admission weight of 65.6 kg as EDW when calculating nutrition needs.  Reviewed RN edema assessment. Pt with moderate pitting generalized edema and moderate pitting edema to BLE.  Patient is currently intubated on ventilator support MV: 11.9 L/min Temp (24hrs), Avg:92.7 F (33.7 C), Min:90.9 F (32.7 C), Max:97 F (36.1 C) BP: 83/60 MAP: 69  Amiodarone: 33.3 ml/hr Fentanyl: 20 ml/hr Levophed: 31.9 ml/hr Vasopressin: 15 ml/hr  Medications reviewed and include: Boost Breeze, Pro-stat, Solu-Cortef, SSI q 4 hours, lactulose enema BID, Protonix, Lokelma, zinc sulfate, IV albumin 25 grams q 6 hours, IV abx, IV vitamin K 5 mg daily  Labs reviewed: sodium 129, potassium 6.2, CO2 12, magnesium 2.8, elevated LFTs, hemoglobin 9.6 CBG's: 53-138 x 24 hours  UOP: 7 ml x 24 hours NGT: 300 ml x 24 hours CRRT UF: 979 ml x 24 hours I/O's: +7.3 L since admit  Diet Order:   Diet Order            Diet NPO time specified  Diet effective now              EDUCATION NEEDS:   Not appropriate for education at this time  Skin:  Skin Assessment: Skin Integrity Issues:  Other: laceration to buttocks  Last BM:  12/30/18 type 6  Height:   Ht Readings from Last 1 Encounters:  12/30/18 5\' 5"  (1.651 m)    Weight:   Wt Readings from Last 1 Encounters:  01-11-2019 72.3 kg    Ideal Body Weight:  61.8 kg  BMI:  Body mass index is 26.52 kg/m.  Estimated Nutritional Needs:   Kcal:  1682  Protein:  115-130 grams  Fluid:  per MD    Gaynell Face, MS, RD,  LDN Inpatient Clinical Dietitian Pager: [phone] Weekend/After Hours: [phone]

## 2019-01-20 NOTE — Progress Notes (Signed)
Events throughout the day.  Unable to get a BP.  A-line placed.  BP 54/22 on 40 of levo.  Increased to 60 with no effect.  Contacted by GI, multiple attempts at transfer and transplant consideration but patient is not a candidate.  Based on that, I spoke with the daughter and wife.  Informed her that we are at max support at this point and recommended that we need to concentrate more on comfort at this point and stop the pressors and allow patient to pass in peace.  They are coming in to the hospital to say their goodbyes and will d/c levophed and allow patient to expire in peace.  The patient is critically ill with multiple organ systems failure and requires high complexity decision making for assessment and support, frequent evaluation and titration of therapies, application of advanced monitoring technologies and extensive interpretation of multiple databases.   Critical Care Time devoted to patient care services described in this note is  45  Minutes. This time reflects time of care of this signee Dr [name]. This critical care time does not reflect procedure time, or teaching time or supervisory time of PA/NP/Med student/Med Resident etc but could involve care discussion time.  Rush Farmer, M.D. [facility] Pulmonary/Critical Care Medicine. Pager: [phone]. After hours pager: [phone].

## 2019-01-20 NOTE — Progress Notes (Signed)
10cc blood drawn out of both blue and red ports of [place] and wasted, both ports flushed with NS, and CRRT started.

## 2019-01-20 NOTE — Progress Notes (Signed)
CDS called with referral. Donor number [id number]. Coordinator notified and plans to call back to the unit. Bedside RN Elmyra Ricks made aware of this.

## 2019-01-20 NOTE — Progress Notes (Signed)
eLink Physician-Brief Progress Note Patient Name: Michael Hamilton DOB: 14-Aug-1960 MRN: [id number]   Date of Service  01-01-2019  HPI/Events of Note  AFIB with RVR - Ventricular rate = 138. BP = 111/82. Currently on a Norepinephrine IV infusion for hemodynamic support.   eICU Interventions  Will order: 1. Amiodarone IV load and infusion. 2. Please send AM labs now.      Intervention Category Major Interventions: Arrhythmia - evaluation and management  Shaqueena Mauceri Eugene 01-01-2019, 4:25 AM

## 2019-01-20 NOTE — Progress Notes (Signed)
[facility] Infectious Disease   Reason for visit: Follow up on SBP  Interval History: transferred to [facility] for hemodialysis.  Changed to meropenem by GI and CCM due to worsening state/sepsis.  Intubated, non-responsive.  LFTs significantly elevated   Physical Exam: Constitutional:  Vitals:   2019-01-16 0915 01/16/2019 0930  BP: (!) 84/50 (!) 90/58  Pulse:    Resp: (!) 26 (!) 26  Temp: (!) 94.5 F (34.7 C) (!) 94.5 F (34.7 C)  SpO2:  100%   patient is intubated and does not respond Eyes: icteric HENT: +ET Respiratory: respiratory effort on vent Cardiovascular: tachy RR GI: soft, nt, nd MS: + edema Skin: jaundice  Review of Systems: Unable to be assessed due to mental status  Lab Results  Component Value Date   WBC 20.6 (H) 2019/01/16   HGB 9.6 (L) 01-16-2019   HCT 29.8 (L) 01/16/2019   MCV 79.0 (L) 2019/01/16   PLT 95 (L) 2019-01-16    Lab Results  Component Value Date   CREATININE 4.84 (H) 01-16-19   BUN 66 (H) 01-16-19   NA 129 (L) 01-16-2019   K 6.2 (H) 01/16/2019   CL 91 (L) 2019/01/16   CO2 12 (L) 2019-01-16    Lab Results  Component Value Date   ALT 1,097 (H) 01-16-19   AST 3,766 (H) Jan 16, 2019   ALKPHOS 119 01-16-19     Microbiology: Recent Results (from the past 824 hour(s))  Helicobacter pylori special antigen     Status: None   Collection Time: 12/24/18  2:23 PM   Specimen: Stool  Result Value Ref Range Status   MICRO NUMBER: [id number]  Final   SPECIMEN QUALITY Adequate  Final   SOURCE: STOOL  Final   STATUS: FINAL  Final   RESULT:   Final    Not Detected  Antimicrobials, proton pump inhibitors, and bismuth preparations inhibit H. pylori and ingestion up to two weeks prior to testing may cause false negative results. If clinically indicated the test should be repeated on a new specimen  obtained two weeks after discontinuing treatment.   SARS Coronavirus 2 (CEPHEID - Performed in [place] hospital lab), Hosp Order      Status: None   Collection Time: 12/24/2018  6:42 AM   Specimen: Nasopharyngeal Swab  Result Value Ref Range Status   SARS Coronavirus 2 NEGATIVE NEGATIVE Final    Comment: (NOTE) If result is NEGATIVE SARS-CoV-2 target nucleic acids are NOT DETECTED. The SARS-CoV-2 RNA is generally detectable in upper and lower  respiratory specimens during the acute phase of infection. The lowest  concentration of SARS-CoV-2 viral copies this assay can detect is 250  copies / mL. A negative result does not preclude SARS-CoV-2 infection  and should not be used as the sole basis for treatment or other  patient management decisions.  A negative result may occur with  improper specimen collection / handling, submission of specimen other  than nasopharyngeal swab, presence of viral mutation(s) within the  areas targeted by this assay, and inadequate number of viral copies  (<250 copies / mL). A negative result must be combined with clinical  observations, patient history, and epidemiological information. If result is POSITIVE SARS-CoV-2 target nucleic acids are DETECTED. The SARS-CoV-2 RNA is generally detectable in upper and lower  respiratory specimens dur ing the acute phase of infection.  Positive  results are indicative of active infection with SARS-CoV-2.  Clinical  correlation with patient history and other diagnostic information is  necessary to determine patient infection status.  Positive results do  not rule out bacterial infection or co-infection with other viruses. If result is PRESUMPTIVE POSTIVE SARS-CoV-2 nucleic acids MAY BE PRESENT.   A presumptive positive result was obtained on the submitted specimen  and confirmed on repeat testing.  While 2019 novel coronavirus  (SARS-CoV-2) nucleic acids may be present in the submitted sample  additional confirmatory testing may be necessary for epidemiological  and / or clinical management purposes  to differentiate between  SARS-CoV-2 and other  Sarbecovirus currently known to infect humans.  If clinically indicated additional testing with an alternate test  methodology [phone]) is advised. The SARS-CoV-2 RNA is generally  detectable in upper and lower respiratory sp ecimens during the acute  phase of infection. The expected result is Negative. Fact Sheet for Patients:  StrictlyIdeas.no Fact Sheet for Healthcare Providers: BankingDealers.co.za This test is not yet approved or cleared by the [place] FDA and has been authorized for detection and/or diagnosis of SARS-CoV-2 by FDA under an Emergency Use Authorization (EUA).  This EUA will remain in effect (meaning this test can be used) for the duration of the COVID-19 declaration under Section 564(b)(1) of the Act, 21 U.S.C. section 360bbb-3(b)(1), unless the authorization is terminated or revoked sooner. Performed at [facility], [place] [address], [place], [place] [address]   Culture, blood (routine x 2)     Status: None   Collection Time: 01/14/2019  7:40 AM   Specimen: BLOOD LEFT HAND  Result Value Ref Range Status   Specimen Description   Final    BLOOD LEFT HAND Performed at [facility], [place] [address], [place], [place] [address]    Special Requests   Final    BOTTLES DRAWN AEROBIC AND ANAEROBIC Blood Culture adequate volume Performed at [place] [address] [place], [place] [address]    Culture   Final    NO GROWTH 5 DAYS Performed at [facility], [place] [address] [place], [place] [address]    Report Status 12/30/2018 FINAL  Final  Culture, blood (routine x 2)     Status: None   Collection Time: 01/16/2019  7:50 AM   Specimen: BLOOD  Result Value Ref Range Status   Specimen Description   Final    BLOOD BLOOD LEFT WRIST Performed at [facility] [address] [place], [place] [address]    Special Requests   Final    BOTTLES DRAWN  AEROBIC AND ANAEROBIC Blood Culture results may not be optimal due to an excessive volume of blood received in culture bottles Performed at [facility] [address] [place], [place] [address]    Culture   Final    NO GROWTH 5 DAYS Performed at [facility], [place] [address], [place], [place] [address]    Report Status 12/30/2018 FINAL  Final  Culture, body fluid-bottle     Status: Abnormal   Collection Time: 01/13/2019 12:44 PM   Specimen: Peritoneal Washings  Result Value Ref Range Status   Specimen Description PERITONEAL  Final   Special Requests NONE  Final   Gram Stain   Final    GRAM NEGATIVE RODS IN BOTH AEROBIC AND ANAEROBIC BOTTLES CRITICAL RESULT CALLED TO, READ BACK BY AND VERIFIED WITH: JOHNSON AT [place] [id number] BY SJW Performed at [facility], [place] [address] [place],  [address]    Culture KLEBSIELLA PNEUMONIAE ESCHERICHIA COLI  (  A)  Final   Report Status 12/30/2018 FINAL  Final   Organism ID, Bacteria KLEBSIELLA PNEUMONIAE  Final   Organism ID, Bacteria ESCHERICHIA COLI  Final      Susceptibility   Escherichia coli - MIC*    AMPICILLIN >=32 RESISTANT Resistant     CEFAZOLIN <=4 SENSITIVE Sensitive     CEFEPIME <=1 SENSITIVE Sensitive     CEFTAZIDIME <=1 SENSITIVE Sensitive     CEFTRIAXONE <=1 SENSITIVE Sensitive     CIPROFLOXACIN >=4 RESISTANT Resistant     GENTAMICIN >=16 RESISTANT Resistant     IMIPENEM <=0.25 SENSITIVE Sensitive     TRIMETH/SULFA >=320 RESISTANT Resistant     AMPICILLIN/SULBACTAM >=32 RESISTANT Resistant     PIP/TAZO <=4 SENSITIVE Sensitive     Extended ESBL NEGATIVE Sensitive     * ESCHERICHIA COLI   Klebsiella pneumoniae - MIC*    AMPICILLIN >=32 RESISTANT Resistant     CEFAZOLIN <=4 SENSITIVE Sensitive     CEFEPIME <=1 SENSITIVE Sensitive     CEFTAZIDIME <=1 SENSITIVE Sensitive     CEFTRIAXONE <=1 SENSITIVE Sensitive     CIPROFLOXACIN >=4 RESISTANT Resistant     GENTAMICIN >=16 RESISTANT  Resistant     IMIPENEM <=0.25 SENSITIVE Sensitive     TRIMETH/SULFA >=320 RESISTANT Resistant     AMPICILLIN/SULBACTAM 16 INTERMEDIATE Intermediate     PIP/TAZO <=4 SENSITIVE Sensitive     Extended ESBL NEGATIVE Sensitive     * KLEBSIELLA PNEUMONIAE  Gram stain     Status: None   Collection Time: 01/11/2019 12:44 PM   Specimen: Peritoneal Washings  Result Value Ref Range Status   Specimen Description PERITONEAL  Final   Special Requests NONE  Final   Gram Stain   Final    ABUNDANT WBC PRESENT,BOTH PMN AND MONONUCLEAR NO ORGANISMS SEEN Performed at [facility], [address], [place], [place] [address]    Report Status 12/30/2018 FINAL  Final  Urine culture     Status: Abnormal   Collection Time: 12/28/2018  1:12 PM   Specimen: Urine, Random  Result Value Ref Range Status   Specimen Description   Final    URINE, RANDOM Performed at [facility] [address], [place], [place] [address]    Special Requests   Final    NONE Performed at [facility], [place] [address] [place], [place] [address]    Culture MULTIPLE SPECIES PRESENT, SUGGEST RECOLLECTION (A)  Final   Report Status 12/27/2018 FINAL  Final  Culture, body fluid-bottle     Status: None (Preliminary result)   Collection Time: 12/27/18 12:25 PM   Specimen: Peritoneal Washings  Result Value Ref Range Status   Specimen Description PERITONEAL FLUID  Final   Special Requests BOTTLES DRAWN AEROBIC AND ANAEROBIC  Final   Culture   Final    NO GROWTH 3 DAYS Performed at [facility], [place] [address] [place], [place] [address]    Report Status PENDING  Incomplete  Gram stain     Status: None   Collection Time: 12/27/18 12:25 PM   Specimen: Peritoneal Washings  Result Value Ref Range Status   Specimen Description PERITONEAL FLUID  Final   Special Requests NONE  Final   Gram Stain   Final    WBC PRESENT, PREDOMINANTLY PMN NO ORGANISMS SEEN CYTOSPIN SMEAR Performed at  [facility], [place] [address], [place], [place] [address]    Report Status 12/28/2018 FINAL  Final  MRSA PCR Screening     Status: None   Collection Time: 12/27/18  2:17 PM   Specimen: Nasopharyngeal  Result Value Ref Range Status   MRSA by PCR NEGATIVE NEGATIVE Final    Comment:        The GeneXpert MRSA Assay (FDA approved for NASAL specimens only), is one component of a comprehensive MRSA colonization surveillance program. It is not intended to diagnose MRSA infection nor to guide or monitor treatment for MRSA infections. Performed at [facility], [place] [address], [place], [place] [address]     Impression/Plan:  1. SBP - on broad coverage with meropenem.    2.  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  LFTs noted.  On supportive care but poor prognosis overall.    3. Renal failure - on HD, uremic.

## 2019-01-20 NOTE — Progress Notes (Signed)
Arterial line placement attempted by 2 RT's. Blood was returned each stick, but was unable to thread catheter. RN aware.

## 2019-01-20 NOTE — Progress Notes (Signed)
Inpatient Diabetes Program Recommendations  AACE/ADA: New Consensus Statement on Inpatient Glycemic Control (2015)  Target Ranges:  Prepandial:   less than 140 mg/dL      Peak postprandial:   less than 180 mg/dL (1-2 hours)      Critically ill patients:  140 - 180 mg/dL   Lab Results  Component Value Date   GLUCAP 138 (H) 01-18-19   HGBA1C 8.0 (H) 01/17/2019    Review of Glycemic Control Results for GRIFFON, HERBERG (MRN [id number]) as of 01/18/2019 10:44  Ref. Range 12/30/2018 21:12 12/30/2018 21:35 2019-01-18 00:21 01/18/19 03:37 2019/01/18 04:04  Glucose-Capillary Latest Ref Range: 70 - 99 mg/dL 53 (L) 130 (H) 95 54 (L) 138 (H)   Inpatient Diabetes Program Recommendations:    Note low blood sugars- Consider holding Novolog correction due to lows or reduce correction to start at 151 mg/dL.  (151-250 mg/dL- 1 unit, 251-300 mg/dL-2 units, 301-350 mg/dL- 3 units, 351-400 mg/dL- 4 units).    Thanks,  Adah Perl, RN, BC-ADM Inpatient Diabetes Coordinator Pager [phone] (8a-5p)

## 2019-01-20 NOTE — Death Summary Note (Signed)
DEATH SUMMARY   Patient Details  Name: Michael Hamilton MRN: [id number] DOB: 13-Oct-1960  Admission/Discharge Information   Admit Date:  01-08-19  Date of Death: Date of Death: Jan 14, 2019  Time of Death: Time of Death: 59-Apr-1854  Length of Stay: 5  Referring Physician: [name], MD   Reason(s) for Hospitalization  Liver failure  Diagnoses  Preliminary cause of death: Liver failure   Secondary Diagnoses (including complications and co-morbidities):  Principal Problem:   SBP (spontaneous bacterial peritonitis) ([place]) Active Problems:   Diabetes mellitus without complication ([place])   Essential hypertension   Autoimmune hepatitis ([place])   Microcytic anemia   Paraproteinemia   Hyponatremia   Cirrhosis of liver (H[facility])   Acute kidney injury ([place])   Thrombocytopenia (HCC)   Esophageal varices (H[facility])   Hepatitis C antibody test positive   Hepatitis B core antibody positive   Bowel obstruction ([place])   Palliative care by specialist   Palliative care encounter   Acute hypoxemic respiratory failure ([place])   Refractory shock ([place])   Brief Hospital Course (including significant findings, care, treatment, and services provided and events leading to death)  58 year old man with autoimmune hepatitis progressed to cirrhosis presenting with SBP, HRS, and possibly SBO vs ileus.  Undergoing workup for potential transplant but unfortunately developed progressive renal injury and started HD today.  Shortly after starting HD he became less responsive, Bps dropped and he required intubation for airway protection.    A-line placed.  BP 54/22 on 40 of levo.  Increased to 60 with no effect.  Contacted by GI, multiple attempts at transfer and transplant consideration but patient is not a candidate.  Based on that, I spoke with the daughter and wife.  Informed her that we are at max support at this point and recommended that we need to concentrate more on comfort at this point and stop the pressors and  allow patient to pass in peace.  They are coming in to the hospital to say their goodbyes and will d/c levophed and allow patient to expire in peace.  Levophed was stopped and patient's BP dropped and patient expired shortly thereafter.  Pertinent Labs and Studies  Significant Diagnostic Studies Ct Abdomen Pelvis Wo Contrast  Result Date: 12/30/2018 CLINICAL DATA:  Low-grade bowel obstruction. EXAM: CT ABDOMEN AND PELVIS WITHOUT CONTRAST TECHNIQUE: Multidetector CT imaging of the abdomen and pelvis was performed following the standard protocol without IV contrast. COMPARISON:  CT scan of Jan 08, 2019. FINDINGS: Lower chest: Minimal right pleural effusions are noted with adjacent subsegmental atelectasis or infiltrates. Hepatobiliary: Hepatic cirrhosis is again noted. Distended gallbladder is noted without definite cholelithiasis. No definite biliary dilatation is noted. Pancreas: Unremarkable. No pancreatic ductal dilatation or surrounding inflammatory changes. Spleen: Normal in size without focal abnormality. Adrenals/Urinary Tract: Adrenal glands and kidneys appear normal. No hydronephrosis or renal obstruction is noted. No renal or ureteral calculi are noted. Urinary bladder is decompressed secondary to Foley catheter. Stomach/Bowel: Nasogastric tube is seen entering proximal duodenum. The appendix appears normal. No colonic dilatation is noted. Mildly dilated small bowel loops are noted which most likely represent ileus, although distal small bowel obstruction cannot be excluded. Vascular/Lymphatic: Aortic atherosclerosis. No enlarged abdominal or pelvic lymph nodes. Reproductive: Prostate is unremarkable. Other: Moderate ascites is noted. No hernia is noted. Moderate anasarca is noted as well. Musculoskeletal: No acute or significant osseous findings. IMPRESSION: Stable findings consistent with hepatic cirrhosis. Moderate ascites is noted. Mildly dilated small bowel loops are noted which most likely  represent ileus, although distal small bowel obstruction cannot be excluded. Minimal right pleural effusions are noted with adjacent subsegmental atelectasis or infiltrate. Aortic Atherosclerosis (ICD10-I70.0). Electronically Signed   By: Marijo Conception M.D.   On: 12/30/2018 20:34   Dg Chest 2 View  Result Date: 01/11/2019 CLINICAL DATA:  Shortness of breath EXAM: CHEST - 2 VIEW COMPARISON:  12/13/2018 FINDINGS: Low volume chest with streaky opacity at the bases. Normal heart size when accounting for lung volumes. No Kerley lines, effusion, or pneumothorax. IMPRESSION: Low volume chest with atelectasis. Electronically Signed   By: Monte Fantasia M.D.   On: 01/17/2019 06:22   Dg Abd 1 View  Result Date: 2019-01-17 CLINICAL DATA:  Abdominal distension EXAM: ABDOMEN - 1 VIEW COMPARISON:  12/30/18 FINDINGS: Gastric catheter is again seen in the second portion of the duodenum. Multiple air-filled loops of dilated small bowel are noted similar to that seen on prior CT examination. Changes are again consistent with at least partial small bowel obstruction. No free air is noted. IMPRESSION: Changes of small-bowel obstruction Electronically Signed   By: Inez Catalina M.D.   On: 2019/01/17 08:15   Dg Abd 1 View  Result Date: 12/30/2018 CLINICAL DATA:  NG tube placement. EXAM: ABDOMEN - 1 VIEW COMPARISON:  December 30, 2018 FINDINGS: The NG tube terminates with the side port and distal tip within the stomach. Continued small bowel obstruction. IMPRESSION: The distal tip and side port of the NG tube are within the stomach, in good position. Continued small bowel obstruction. Electronically Signed   By: Dorise Bullion III M.D   On: 12/30/2018 12:18   Dg Abd 1 View  Result Date: 12/30/2018 CLINICAL DATA:  Small bowel obstruction. EXAM: ABDOMEN - 1 VIEW COMPARISON:  12/29/2018 FINDINGS: An enteric tube is no longer seen. Moderate gaseous dilatation of multiple small bowel loops is similar to the prior study. Bulging  flanks and hazy abdominal density are consistent with known ascites. No gross intraperitoneal free air is identified on this supine study which partially excludes the left hemiabdomen. IMPRESSION: Unchanged small bowel dilatation consistent with obstruction. Electronically Signed   By: Logan Bores M.D.   On: 12/30/2018 10:16   Dg Abd 1 View  Result Date: 12/29/2018 CLINICAL DATA:  58 y.o. Male continues to complain of abdominal pain and distention, and feels poorly in general. No bowel movement in 3 to 4 days. EXAM: ABDOMEN - 1 VIEW COMPARISON:  CT of the abdomen and pelvis on 12/27/2018, abdominal plain films on 07/31/2018 FINDINGS: Interval development of significant small bowel dilatation, with bowel loops measuring up to 0.9 centimeters. The supine views performed show no free intraperitoneal air. Central location of air-filled bowel loops consistent with known ascites. IMPRESSION: Interval development of high grade small bowel obstruction. Ascites. These results will be called to the ordering clinician or representative by the Radiologist Assistant, and communication documented in the PACS or zVision Dashboard. Electronically Signed   By: Nolon Nations M.D.   On: 12/29/2018 12:48   Ct Head Wo Contrast  Result Date: 12/30/2018 CLINICAL DATA:  Inpatient. Altered level of consciousness. Cirrhosis. EXAM: CT HEAD WITHOUT CONTRAST TECHNIQUE: Contiguous axial images were obtained from the base of the skull through the vertex without intravenous contrast. COMPARISON:  None. FINDINGS: Brain: No evidence of parenchymal hemorrhage or extra-axial fluid collection. No mass lesion, mass effect, or midline shift. No CT evidence of acute infarction. Nonspecific minimal subcortical and periventricular white matter hypodensity, most in keeping with chronic small vessel  ischemic change. Cerebral volume is age appropriate. No ventriculomegaly. Vascular: No acute abnormality. Skull: No evidence of calvarial fracture.  Sinuses/Orbits: Mild mucoperiosteal thickening in the ethmoidal air cells and maxillary sinuses. No fluid levels. Other:  The mastoid air cells are unopacified. IMPRESSION: 1.  No evidence of acute intracranial abnormality. 2. Minimal chronic small vessel ischemic changes in the cerebral white matter. 3. Mild chronic appearing paranasal sinusitis. Electronically Signed   By: Ilona Sorrel M.D.   On: 12/30/2018 20:27   Ct Abdomen Pelvis W Contrast  Result Date: 12/30/2018 CLINICAL DATA:  Abdominal pain EXAM: CT ABDOMEN AND PELVIS WITH CONTRAST TECHNIQUE: Multidetector CT imaging of the abdomen and pelvis was performed using the standard protocol following bolus administration of intravenous contrast. CONTRAST:  182mL OMNIPAQUE IOHEXOL 300 MG/ML  SOLN COMPARISON:  12/13/2018 FINDINGS: Lower chest: Trace bilateral effusions.  Heart is normal size. Hepatobiliary: Cirrhosis with nodular shrunken liver. Gallbladder is distended. No visible stones. No biliary ductal dilatation. Pancreas: No focal abnormality or ductal dilatation. Spleen: Splenomegaly with a craniocaudal length 18 cm. No focal abnormality. Adrenals/Urinary Tract: No adrenal abnormality. No focal renal abnormality. No stones or hydronephrosis. Urinary bladder is unremarkable. Stomach/Bowel: Stomach, large and small bowel grossly unremarkable. Vascular/Lymphatic: Aortic atherosclerosis. No enlarged abdominal or pelvic lymph nodes. Reproductive: No focal abnormality. Other: Large volume ascites in the abdomen and pelvis. This is increased since prior study. Musculoskeletal: No acute bony abnormality. IMPRESSION: Cirrhosis with associated splenomegaly and enlarging large abdominal and pelvic ascites. Trace bilateral pleural effusions. Aortic atherosclerosis. Electronically Signed   By: Rolm Baptise M.D.   On: 01/10/2019 09:47   Ct Abdomen Pelvis W Contrast  Result Date: 12/13/2018 CLINICAL DATA:  Generalized abdominal pain with nausea and vomiting. EXAM:  CT ABDOMEN AND PELVIS WITH CONTRAST TECHNIQUE: Multidetector CT imaging of the abdomen and pelvis was performed using the standard protocol following bolus administration of intravenous contrast. CONTRAST:  147mL OMNIPAQUE IOHEXOL 300 MG/ML  SOLN COMPARISON:  CT dated 01/25/2008. FINDINGS: Lower chest: The heart size is mildly enlarged. There are trace bilateral pleural effusions. Hepatobiliary: The liver is cirrhotic appearing. There is no discrete hepatic mass. The gallbladder is distended. There is mild gallbladder wall thickening. Pancreas: Unremarkable. No pancreatic ductal dilatation or surrounding inflammatory changes. Spleen: The spleen is enlarged measuring approximately 18 cm craniocaudad. Adrenals/Urinary Tract: Adrenal glands are unremarkable. Kidneys are normal, without renal calculi, focal lesion, or hydronephrosis. Bladder is unremarkable. Stomach/Bowel: There is diffuse wall thickening of the stomach. There is no evidence of a small-bowel obstruction. There is diffuse wall thickening of the colon at the level of the splenic flexure as well as the mid sigmoid colon. There is a moderate amount of stool. The appendix is unremarkable. Vascular/Lymphatic: There is no abdominal aortic aneurysm. Minimal atherosclerotic changes are noted. There is recanalization of the umbilical vein. 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 Reproductive: Prostate is unremarkable. Other: There is a trace volume of abdominal ascites. Musculoskeletal: No acute or significant osseous findings. IMPRESSION: 1. Cirrhosis with sequela of portal hypertension including significant splenomegaly and a small volume of abdominal ascites. There is no discrete hepatic mass. 2. Diffuse wall thickening involving the sigmoid colon and the splenic flexure and proximal descending colon is suspicious for infectious or inflammatory colitis. No evidence of a small-bowel obstruction. 3. Trace bilateral pleural effusions. Electronically Signed   By:  Constance Holster M.D.   On: 12/13/2018 21:29   US Renal  Result Date: 12/27/2018 CLINICAL DATA:  Acute renal insufficiency. EXAM: RENAL / URINARY  TRACT ULTRASOUND COMPLETE COMPARISON:  CT 01/13/2019 FINDINGS: Right Kidney: Renal measurements: 10.3 x 4.9 x 5.4 cm = volume: 143 mL. Mild increased cortical echogenicity. No mass or hydronephrosis visualized. Left Kidney: Renal measurements: 10.9 x 4.3 x 6.0 cm = volume: 146 mL. Mild increased cortical echogenicity. No mass or hydronephrosis visualized. Bladder: Appears normal for degree of bladder distention. Incidental finding of known ascites with a few septations. IMPRESSION: Normal size kidneys without hydronephrosis. Mild increased cortical echogenicity which can be seen with medical renal disease. Electronically Signed   By: Marin Olp M.D.   On: 12/27/2018 16:08   US Paracentesis  Result Date: 12/28/2018 INDICATION: SBP; Ascites EXAM: ULTRASOUND-GUIDED PARACENTESIS COMPARISON:  Previous paracentesis. MEDICATIONS: 10 cc 1% lidocaine COMPLICATIONS: None immediate. TECHNIQUE: Informed written consent was obtained from the patient after a discussion of the risks, benefits and alternatives to treatment. A timeout was performed prior to the initiation of the procedure. Initial ultrasound scanning demonstrates a large amount of ascites within the right lower abdominal quadrant. The right lower abdomen was prepped and draped in the usual sterile fashion. 1% lidocaine with epinephrine was used for local anesthesia. Under direct ultrasound guidance, a 19 gauge, 7-cm, Yueh catheter was introduced. An ultrasound image was saved for documentation purposed. The paracentesis was performed. The catheter was removed and a dressing was applied. The patient tolerated the procedure well without immediate post procedural complication. FINDINGS: A total of approximately 60 cc of bright orange/yellow fluid was removed. Samples were sent to the laboratory as requested by the  clinical team. Only 60 cc drawn-- just for labs per MD.  INR 4.1 today IMPRESSION: Successful ultrasound-guided paracentesis yielding 60 cc of peritoneal fluid. Read by Lavonia Drafts [facility] Electronically Signed   By: Markus Daft M.D.   On: 12/27/2018 12:29   US Paracentesis  Result Date: 01/11/2019 INDICATION: Patient with history of autoimmune hepatitis, cirrhosis, ascites. Request made for diagnostic and therapeutic paracentesis. EXAM: ULTRASOUND GUIDED DIAGNOSTIC AND THERAPEUTIC PARACENTESIS MEDICATIONS: None COMPLICATIONS: None immediate. PROCEDURE: Informed written consent was obtained from the patient/daughter after a discussion of the risks, benefits and alternatives to treatment. A timeout was performed prior to the initiation of the procedure. Initial ultrasound scanning demonstrates a moderate amount of ascites within the right upper to mid abdominal quadrant. The right upper to mid abdomen was prepped and draped in the usual sterile fashion. 1% lidocaine was used for local anesthesia. Following this, a 19 gauge, 7-cm, Yueh catheter was introduced. An ultrasound image was saved for documentation purposes. The paracentesis was performed. The catheter was removed and a dressing was applied. The patient tolerated the procedure well without immediate post procedural complication. FINDINGS: A total of approximately 2.7 liters of turbid, yellow fluid was removed. Samples were sent to the laboratory as requested by the clinical team. IMPRESSION: Successful ultrasound-guided diagnostic and therapeutic paracentesis yielding 2.7 liters of peritoneal fluid. Read by: Rowe Robert, PA-C Electronically Signed   By: Sandi Mariscal M.D.   On: 12/23/2018 12:41   Ir Fluoro Guide Cv Line Right  Result Date: 12/29/2018 CLINICAL DATA:  Acute kidney injury, hepatic failure and need for non tunneled hemodialysis catheter for potential emergent hemodialysis. EXAM: NON-TUNNELED CENTRAL VENOUS CATHETER PLACEMENT WITH ULTRASOUND  AND FLUOROSCOPIC GUIDANCE FLUOROSCOPY TIME:  36 seconds.  5.7 mGy. PROCEDURE: The procedure, risks, benefits, and alternatives were explained to the patient's daughter. Questions regarding the procedure were encouraged and answered. The patient's daughter understands and consents to the procedure. A time-out was performed  prior to initiating the procedure. Ultrasound was used to confirm patency of the right internal jugular vein. The right neck and chest were prepped with chlorhexidine in a sterile fashion, and a sterile drape was applied covering the operative field. Maximum barrier sterile technique with sterile gowns and gloves were used for the procedure. Local anesthesia was provided with 1% lidocaine. After creating a small venotomy incision, a 21 gauge needle was advanced into the right internal jugular vein under direct, real-time ultrasound guidance. Dilatation was performed over a guidewire. A 13 French, triple lumen Trialysis catheter measuring 15 cm was then advanced over the wire. Final catheter positioning was confirmed and documented with a fluoroscopic spot image. The catheter was aspirated, flushed with saline, and injected with appropriate volume heparin dwells. The catheter exit site was secured with 0-Prolene retention sutures. COMPLICATIONS: None.  No pneumothorax. FINDINGS: After catheter placement, the tip lies at the cavoatrial junction. The catheter aspirates normally and is ready for immediate use. IMPRESSION: Placement of non-tunneled central venous hemodialysis catheter via the right internal jugular vein. The catheter tip lies at the cavoatrial junction. The catheter is ready for immediate use. Electronically Signed   By: Aletta Edouard M.D.   On: 12/29/2018 08:27   Ir US Guide Vasc Access Right  Result Date: 12/29/2018 CLINICAL DATA:  Acute kidney injury, hepatic failure and need for non tunneled hemodialysis catheter for potential emergent hemodialysis. EXAM: NON-TUNNELED CENTRAL  VENOUS CATHETER PLACEMENT WITH ULTRASOUND AND FLUOROSCOPIC GUIDANCE FLUOROSCOPY TIME:  36 seconds.  5.7 mGy. PROCEDURE: The procedure, risks, benefits, and alternatives were explained to the patient's daughter. Questions regarding the procedure were encouraged and answered. The patient's daughter understands and consents to the procedure. A time-out was performed prior to initiating the procedure. Ultrasound was used to confirm patency of the right internal jugular vein. The right neck and chest were prepped with chlorhexidine in a sterile fashion, and a sterile drape was applied covering the operative field. Maximum barrier sterile technique with sterile gowns and gloves were used for the procedure. Local anesthesia was provided with 1% lidocaine. After creating a small venotomy incision, a 21 gauge needle was advanced into the right internal jugular vein under direct, real-time ultrasound guidance. Dilatation was performed over a guidewire. A 13 French, triple lumen Trialysis catheter measuring 15 cm was then advanced over the wire. Final catheter positioning was confirmed and documented with a fluoroscopic spot image. The catheter was aspirated, flushed with saline, and injected with appropriate volume heparin dwells. The catheter exit site was secured with 0-Prolene retention sutures. COMPLICATIONS: None.  No pneumothorax. FINDINGS: After catheter placement, the tip lies at the cavoatrial junction. The catheter aspirates normally and is ready for immediate use. IMPRESSION: Placement of non-tunneled central venous hemodialysis catheter via the right internal jugular vein. The catheter tip lies at the cavoatrial junction. The catheter is ready for immediate use. Electronically Signed   By: Aletta Edouard M.D.   On: 12/29/2018 08:27   Dg Chest Port 1 View  Result Date: 12/13/2018 CLINICAL DATA:  Shortness of breath EXAM: PORTABLE CHEST 1 VIEW COMPARISON:  None. FINDINGS: Cardiac shadows within normal limits.  The lungs are well aerated bilaterally. No focal infiltrate or effusion is seen. No bony abnormality is noted. IMPRESSION: No acute abnormality seen. Electronically Signed   By: Inez Catalina M.D.   On: 12/13/2018 20:31   Dg Abd Portable 1v  Result Date: 12/29/2018 CLINICAL DATA:  NG tube placement. EXAM: PORTABLE ABDOMEN - 1 VIEW COMPARISON:  Earlier film, same date FINDINGS: The NG tube tip is in the body region of the stomach. Again demonstrated are dilated small bowel loops consistent with a small bowel obstruction. No obvious free air. IMPRESSION: NG tube tip is in the body region of the stomach. Persistent small-bowel obstruction bowel gas pattern without obvious free air. Electronically Signed   By: Marijo Sanes M.D.   On: 12/29/2018 15:45   US Abdomen Limited Ruq  Result Date: 12/27/2018 CLINICAL DATA:  Elevated LFTs.  History of cirrhosis. EXAM: ULTRASOUND ABDOMEN LIMITED RIGHT UPPER QUADRANT COMPARISON:  CT abdomen pelvis dated December 25, 2018. Right upper quadrant ultrasound dated August 25, 2018. FINDINGS: Gallbladder: Sludge without gallstones. Mild gallbladder wall thickening is unchanged and likely related to ascites and underlying liver disease. No sonographic Murphy sign noted by sonographer. Common bile duct: Diameter: 3 mm, normal. Liver: No focal lesion identified. Unchanged nodular contour with coarsened parenchymal echogenicity. Portal vein is patent on color Doppler imaging with normal direction of blood flow towards the liver. IMPRESSION: 1. No biliary dilatation. 2. Gallbladder sludge. 3. Unchanged cirrhosis and ascites. Electronically Signed   By: Titus Dubin M.D.   On: 12/27/2018 14:16    Microbiology Recent Results (from the past 240 hour(s))  Culture, body fluid-bottle     Status: None   Collection Time: 12/27/18 12:25 PM   Specimen: Peritoneal Washings  Result Value Ref Range Status   Specimen Description PERITONEAL FLUID  Final   Special Requests BOTTLES DRAWN AEROBIC  AND ANAEROBIC  Final   Culture   Final    NO GROWTH 5 DAYS Performed at [facility], [address], [place], [place] [address]    Report Status 01/01/2019 FINAL  Final  Gram stain     Status: None   Collection Time: 12/27/18 12:25 PM   Specimen: Peritoneal Washings  Result Value Ref Range Status   Specimen Description PERITONEAL FLUID  Final   Special Requests NONE  Final   Gram Stain   Final    WBC PRESENT, PREDOMINANTLY PMN NO ORGANISMS SEEN CYTOSPIN SMEAR Performed at [facility], [place] [address] [place], [place] [address]    Report Status 12/28/2018 FINAL  Final  MRSA PCR Screening     Status: None   Collection Time: 12/27/18  2:17 PM   Specimen: Nasopharyngeal  Result Value Ref Range Status   MRSA by PCR NEGATIVE NEGATIVE Final    Comment:        The GeneXpert MRSA Assay (FDA approved for NASAL specimens only), is one component of a comprehensive MRSA colonization surveillance program. It is not intended to diagnose MRSA infection nor to guide or monitor treatment for MRSA infections. Performed at [facility], [place] [address] [place], [place] [address]     Lab Basic Metabolic Panel: Recent Labs  Lab 12/29/18 0223 12/30/18 0438 12/30/18 1835 2019/01/09 0452 01/09/2019 1435  NA 125* 128* 128* 129* 134*  K 4.6 4.8 4.5 6.2* 5.5*  CL 86* 88*  --  91* 95*  CO2 25 20*  --  12* 11*  GLUCOSE 201* 42*  --  122* 83  BUN 80* 97*  --  66* 44*  CREATININE 3.83* 5.09*  --  4.84* 3.68*  CALCIUM 7.9* 8.9  --  8.8* 9.0  MG  --   --   --  2.8*  --   PHOS  --   --   --   --  9.3*   Liver  Function Tests: Recent Labs  Lab 12/29/18 0223 12/30/18 0438 01/19/2019 0452 January 19, 2019 1435  AST 188* 297* 3,766*  --   ALT 241* 235* 1,097*  --   ALKPHOS 110 107 119  --   BILITOT 26.9* 34.9* 32.1*  --   PROT 6.5 6.7 6.6  --   ALBUMIN 3.0* 2.9* 3.6 4.0   No results for input(s): LIPASE, AMYLASE in the last 168 hours. Recent Labs  Lab  12/30/18 0948 01-19-19 0536  AMMONIA 52* 64*   CBC: Recent Labs  Lab 12/29/18 0940 12/30/18 0439 12/30/18 1835 01-19-2019 0452  WBC 21.4* 27.3*  --  20.6*  NEUTROABS  --  23.4*  --   --   HGB 10.5* 11.8* 13.9 9.6*  HCT 31.0* 35.4* 41.0 29.8*  MCV 75.6* 76.6*  --  79.0*  PLT 120* 159  --  95*   Cardiac Enzymes: Recent Labs  Lab 01-19-2019 0009 01/19/19 0536 01/19/19 1435  TROPONINI 0.04* 0.03* 0.04*   Sepsis Labs: Recent Labs  Lab 12/29/18 0940 12/30/18 0439 12/30/18 0948 12/30/18 1545 12/30/18 1627 19-Jan-2019 0452  WBC 21.4* 27.3*  --   --   --  20.6*  LATICACIDVEN  --   --  10.9* 10.4* >11.0*  --     Procedures/Operations     , 01/04/2019, 3:49 PM

## 2019-01-20 NOTE — Progress Notes (Signed)
Assisted tele visit to patient with daughter.  Schneider Warchol Anderson, RN   

## 2019-01-20 NOTE — Progress Notes (Signed)
CDS notified of TOD. Rule out for donation.

## 2019-01-20 DEATH — deceased

## 2019-09-21 IMAGING — DX PORTABLE CHEST - 1 VIEW
1 series · 1 of 1 positions shown · non-contrast
Comparison: None.

CLINICAL DATA: Shortness of breath

EXAM:
PORTABLE CHEST 1 VIEW

[chest ap]
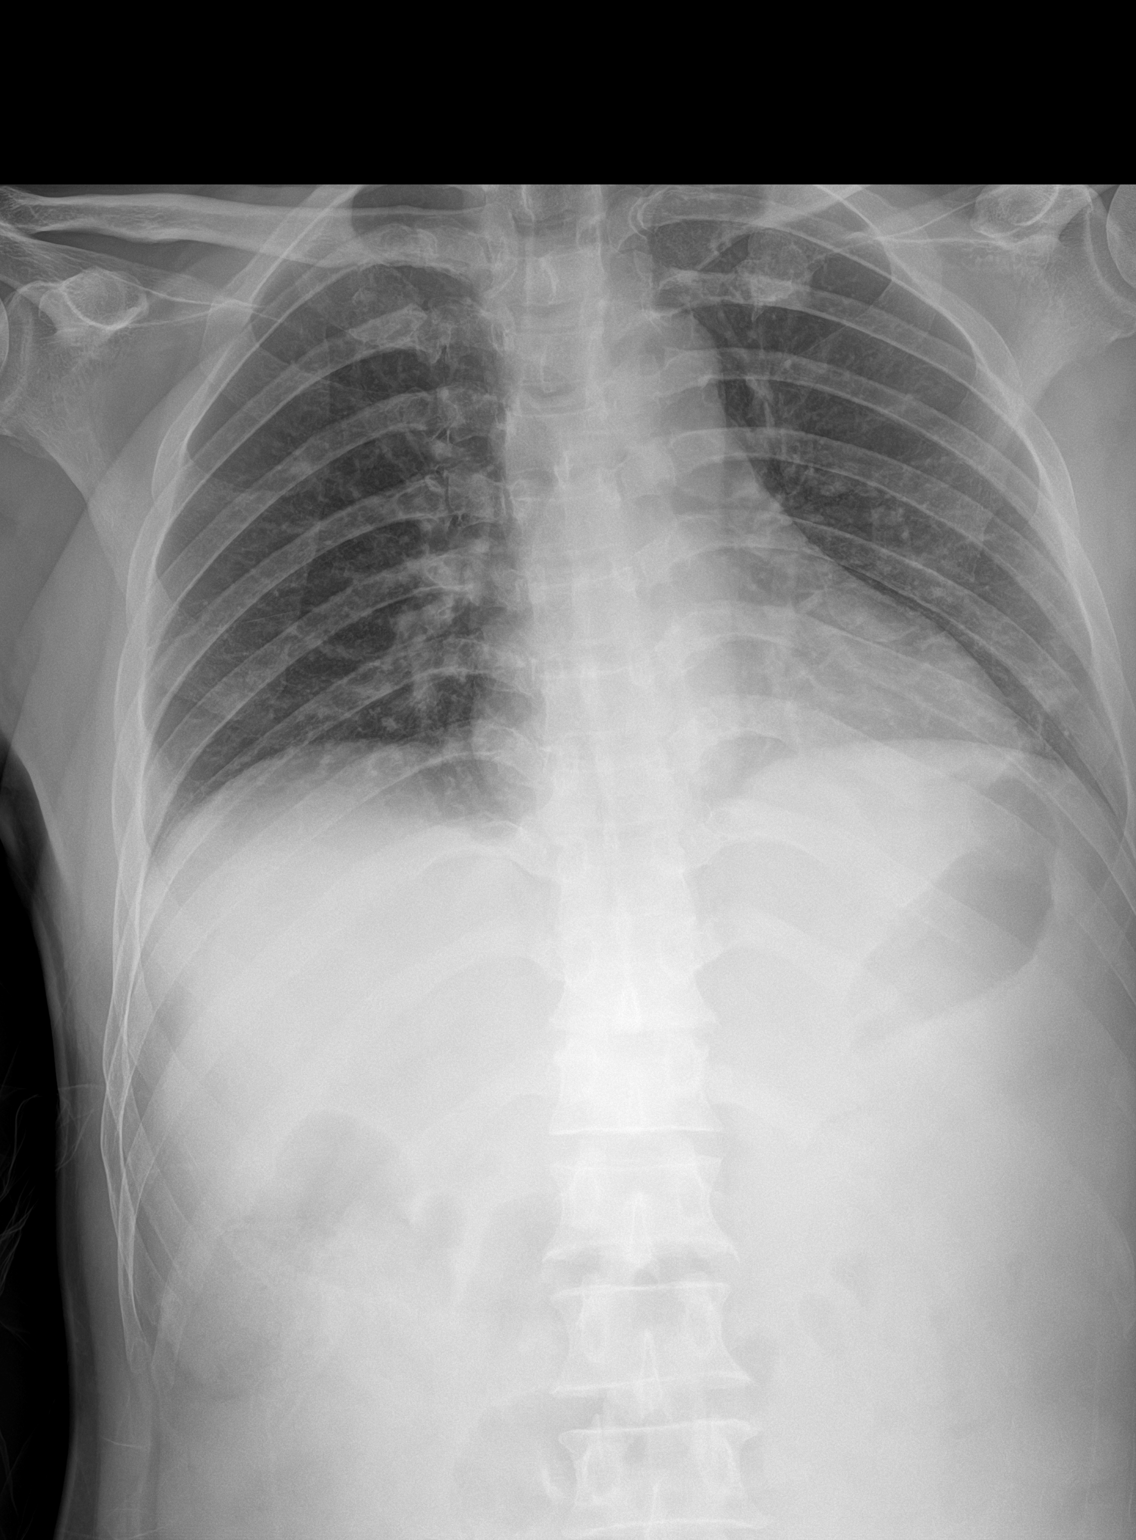

[1 of 1 positions shown; findings below may reference images not displayed]

FINDINGS: Cardiac shadows within normal limits. The lungs are well aerated
bilaterally. No focal infiltrate or effusion is seen. No bony
abnormality is noted.
IMPRESSION: No acute abnormality seen.

## 2019-10-08 IMAGING — CT CT ABDOMEN AND PELVIS WITHOUT CONTRAST
2 of 4 series · 15 of 46 positions shown, 17 images · non-contrast
Comparison: CT scan of December 25, 2018.

CLINICAL DATA: Low-grade bowel obstruction.

EXAM:
CT ABDOMEN AND PELVIS WITHOUT CONTRAST
TECHNIQUE: Multidetector CT imaging of the abdomen and pelvis was performed
following the standard protocol without IV contrast.

[Series 3: abd/ pelvis 5.0 i30f 2 · axial · 0.73mm/px · z∈[-803,-348]mm · 12 of 109 slices shown, 14 images]
[im 9/109  soft-tissue]
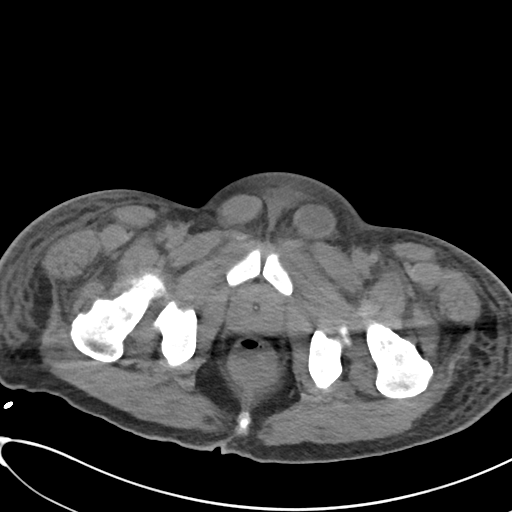
[im 9/109  bone]
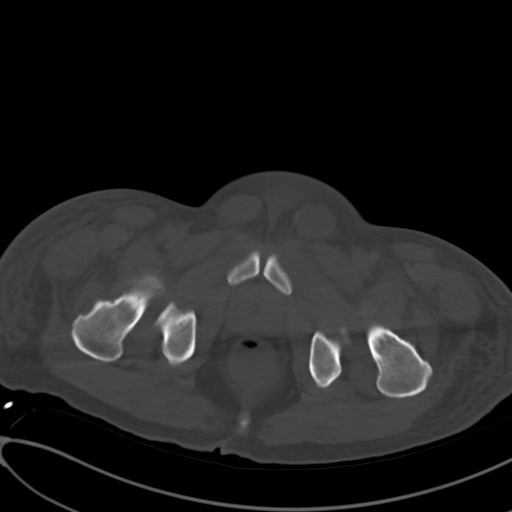
[im 17/109  soft-tissue]
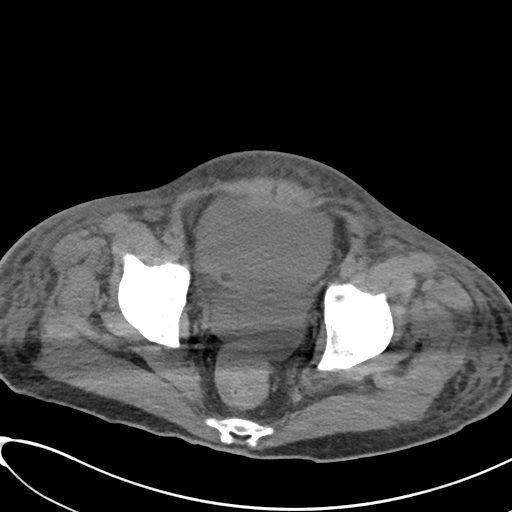
[im 25/109  soft-tissue]
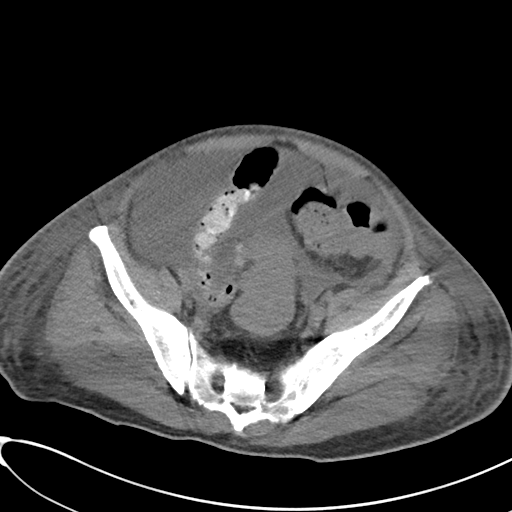
[im 34/109  soft-tissue]
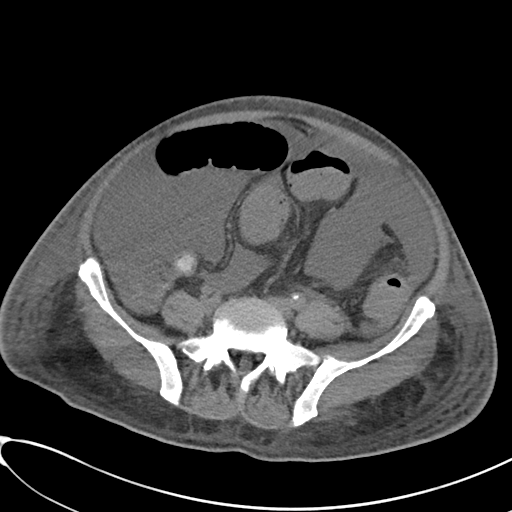
[im 42/109  soft-tissue]
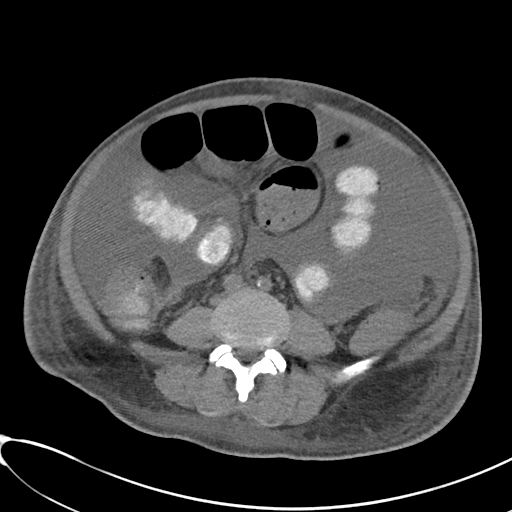
[im 50/109  soft-tissue]
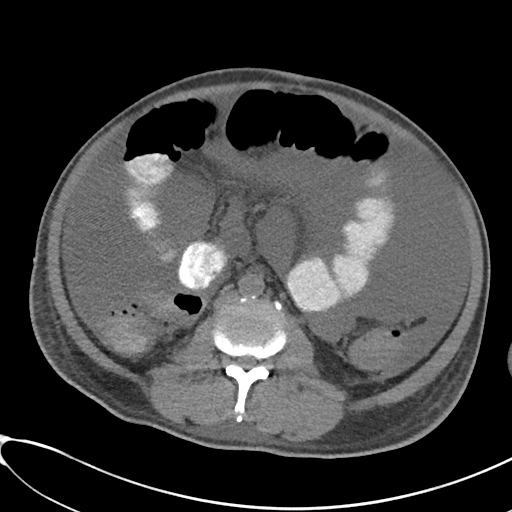
[im 59/109  soft-tissue]
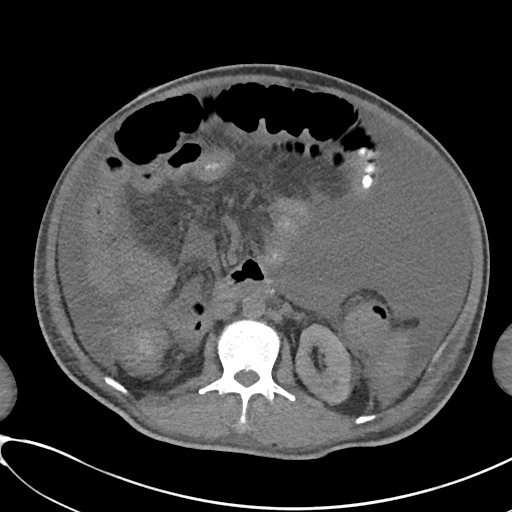
[im 67/109  soft-tissue]
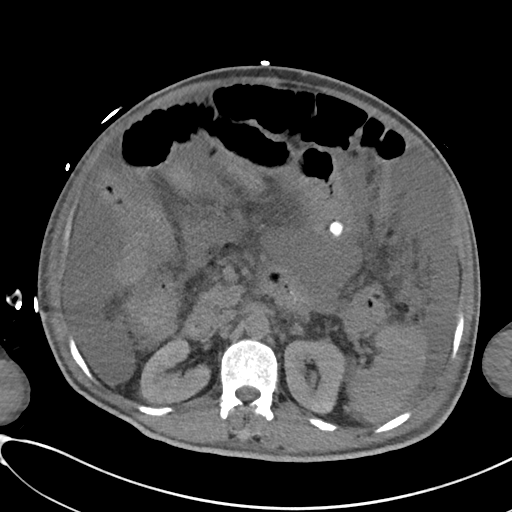
[im 75/109  soft-tissue]
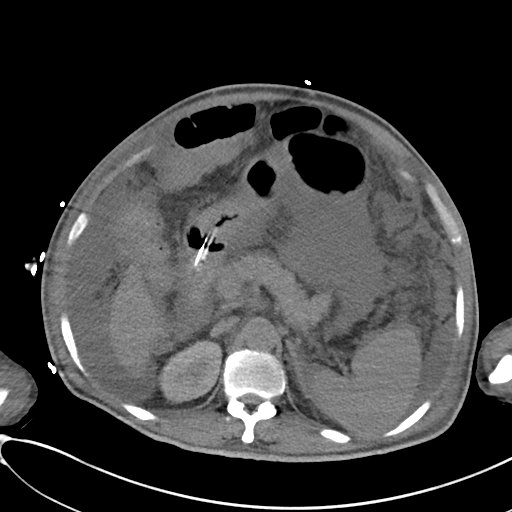
[im 75/109  bone]
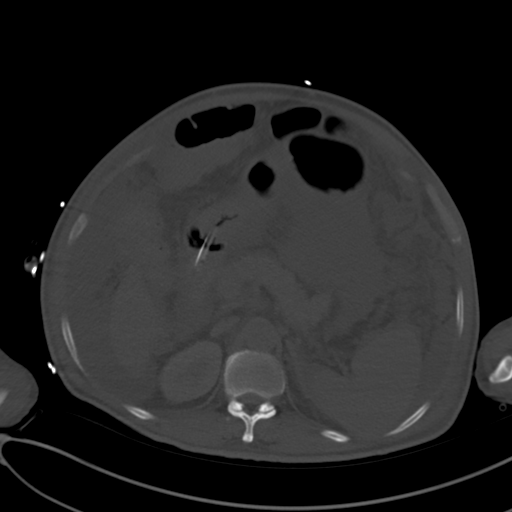
[im 84/109  soft-tissue]
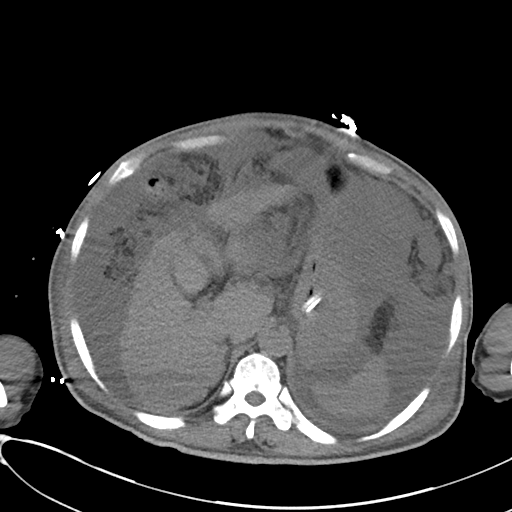
[im 92/109  soft-tissue]
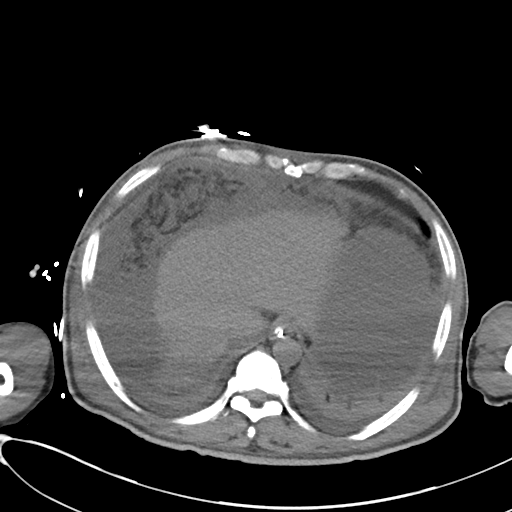
[im 100/109  soft-tissue]
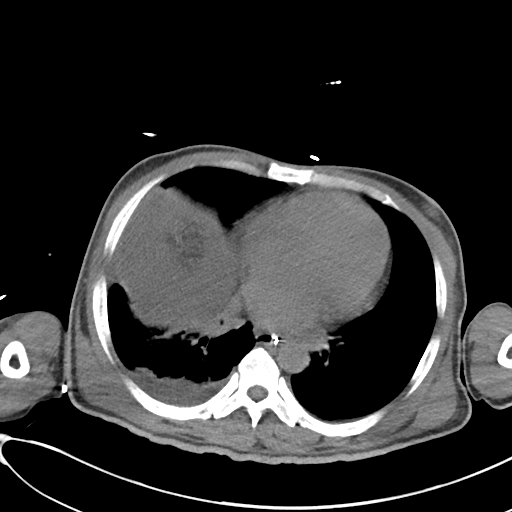

[Series 6: cor st · coronal · 0.76mm/px · 3 of 101 slices shown]
[im 34/101  soft-tissue]
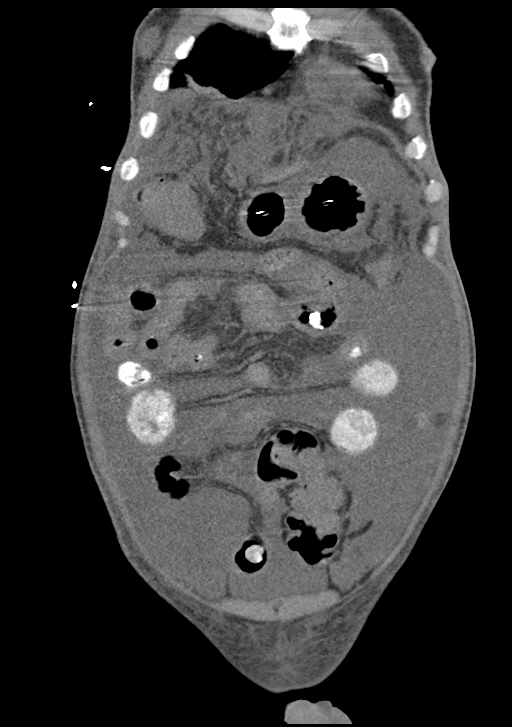
[im 45/101  soft-tissue]
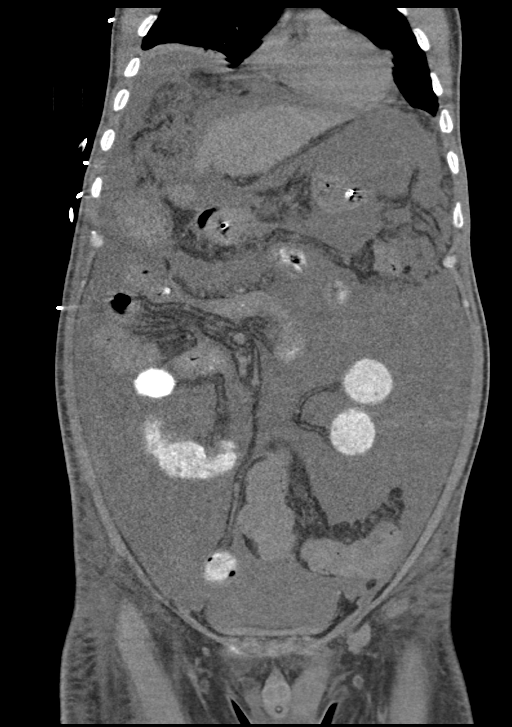
[im 56/101  soft-tissue]
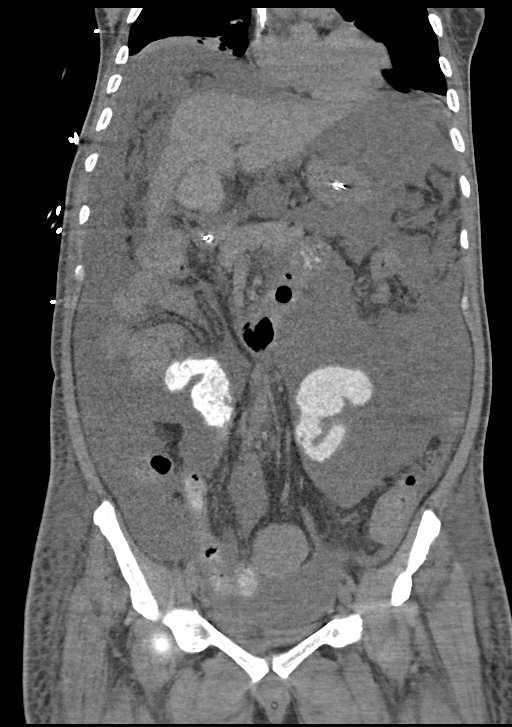

[15 of 46 positions shown; findings below may reference images not displayed]

FINDINGS: Lower chest: Minimal right pleural effusions are noted with adjacent
subsegmental atelectasis or infiltrates.

Hepatobiliary: Hepatic cirrhosis is again noted. Distended
gallbladder is noted without definite cholelithiasis. No definite
biliary dilatation is noted.

Pancreas: Unremarkable. No pancreatic ductal dilatation or
surrounding inflammatory changes.

Spleen: Normal in size without focal abnormality.

Adrenals/Urinary Tract: Adrenal glands and kidneys appear normal. No
hydronephrosis or renal obstruction is noted. No renal or ureteral
calculi are noted. Urinary bladder is decompressed secondary to
Foley catheter.

Stomach/Bowel: Nasogastric tube is seen entering proximal duodenum.
The appendix appears normal. No colonic dilatation is noted. Mildly
dilated small bowel loops are noted which most likely represent
ileus, although distal small bowel obstruction cannot be excluded.

Vascular/Lymphatic: Aortic atherosclerosis. No enlarged abdominal or
pelvic lymph nodes.

Reproductive: Prostate is unremarkable.

Other: Moderate ascites is noted. No hernia is noted. Moderate
anasarca is noted as well.

Musculoskeletal: No acute or significant osseous findings.
IMPRESSION: Stable findings consistent with hepatic cirrhosis. Moderate ascites
is noted.

Mildly dilated small bowel loops are noted which most likely
represent ileus, although distal small bowel obstruction cannot be
excluded.

Minimal right pleural effusions are noted with adjacent subsegmental
atelectasis or infiltrate.

Aortic Atherosclerosis ([id number].S).

## 2019-10-08 IMAGING — DX ABDOMEN - 1 VIEW
1 series · 1 of 1 positions shown · non-contrast
Comparison: December 30, 2018

CLINICAL DATA: NG tube placement.

EXAM:
ABDOMEN - 1 VIEW

[abdomen kub]
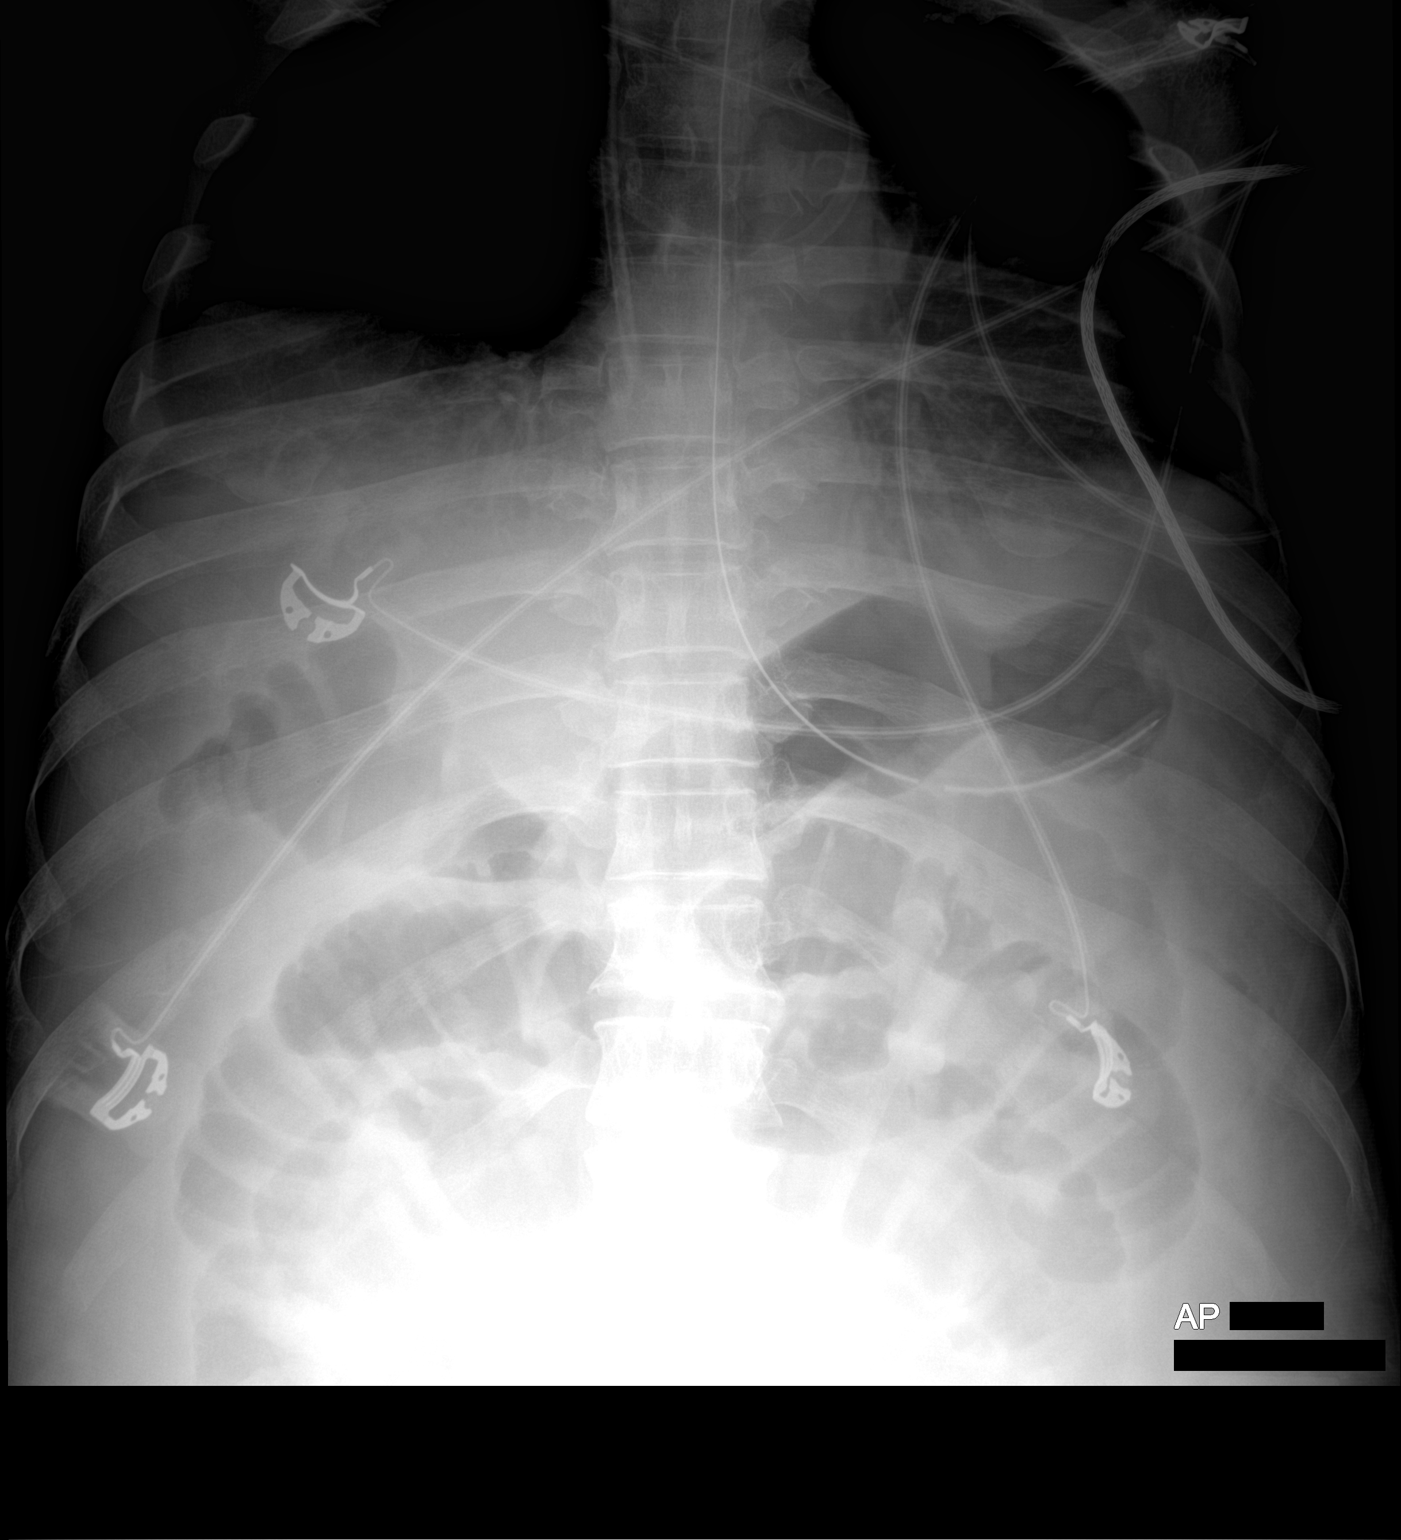

[1 of 1 positions shown; findings below may reference images not displayed]

FINDINGS: The NG tube terminates with the side port and distal tip within the
stomach. Continued small bowel obstruction.
IMPRESSION: The distal tip and side port of the NG tube are within the stomach,
in good position. Continued small bowel obstruction.

## 2019-10-08 IMAGING — DX ABDOMEN - 1 VIEW
1 series · 1 of 1 positions shown · non-contrast
Comparison: 12/29/2018

CLINICAL DATA: Small bowel obstruction.

EXAM:
ABDOMEN - 1 VIEW

[abdomen kub]
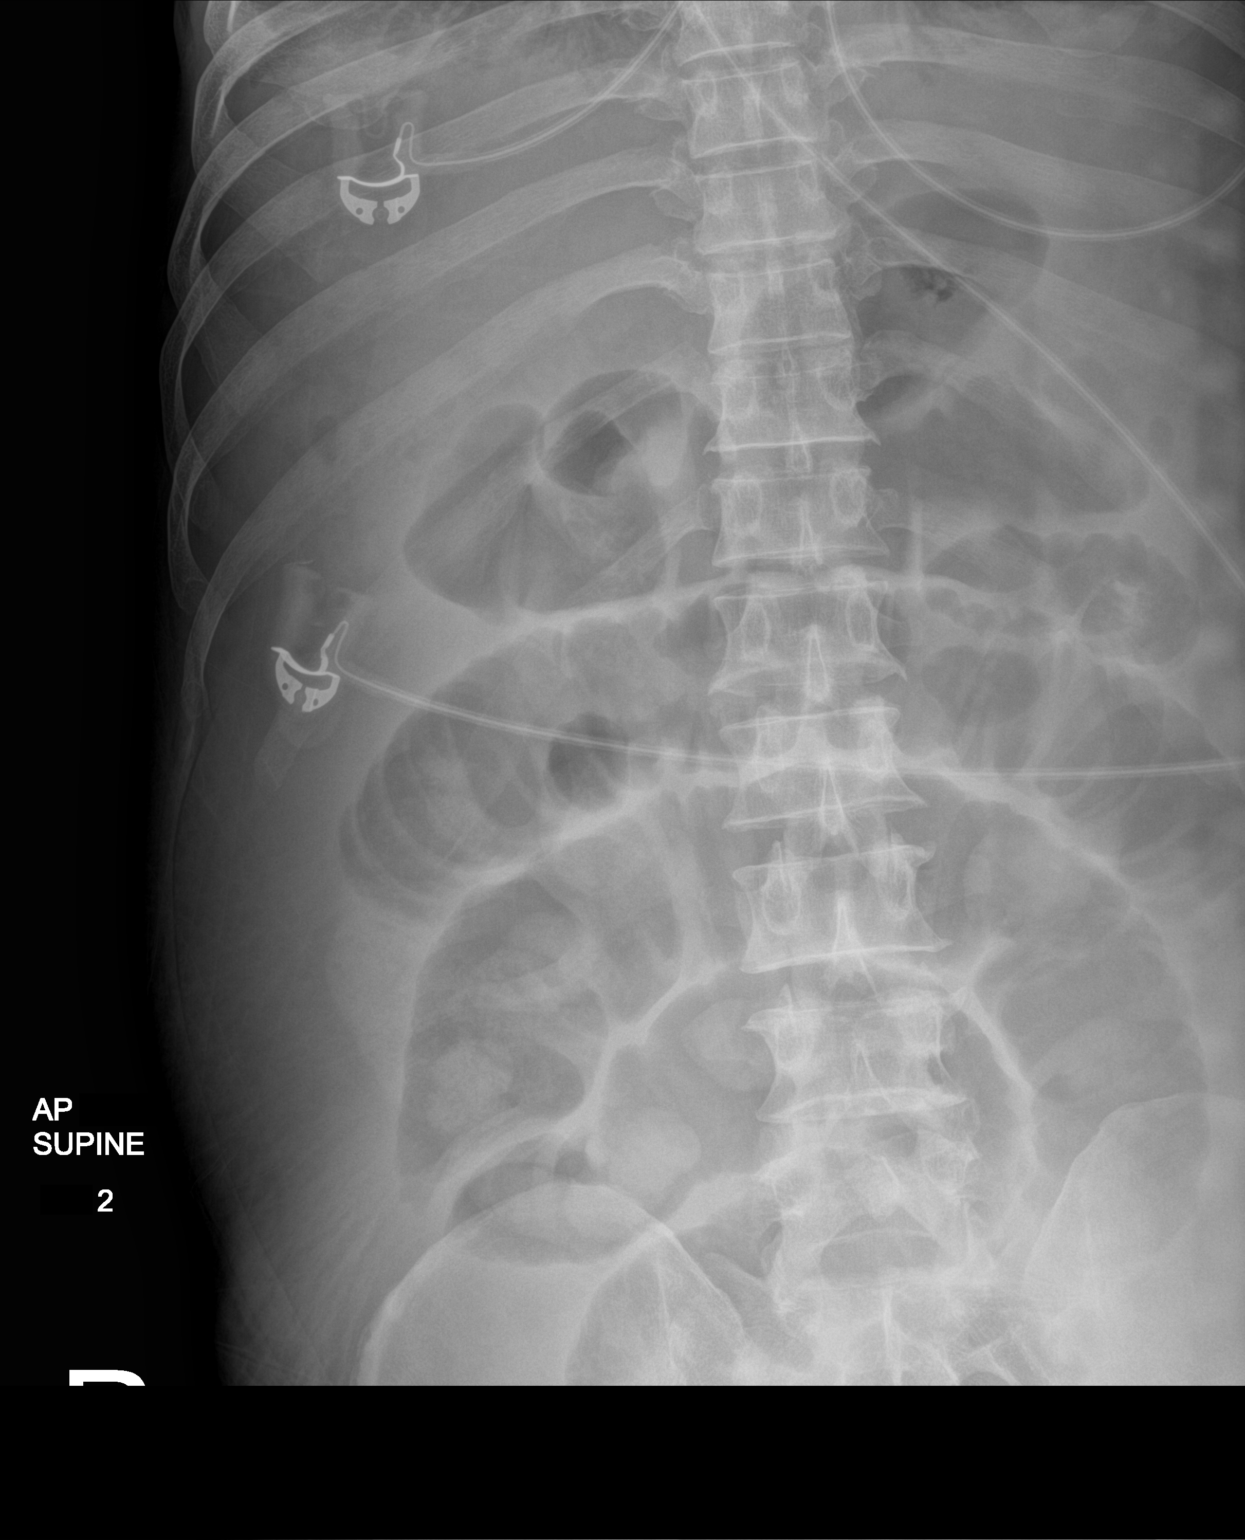

[1 of 1 positions shown; findings below may reference images not displayed]

FINDINGS: An enteric tube is no longer seen. Moderate gaseous dilatation of
multiple small bowel loops is similar to the prior study. Bulging
flanks and hazy abdominal density are consistent with known ascites.
No gross intraperitoneal free air is identified on this supine study
which partially excludes the left hemiabdomen.
IMPRESSION: Unchanged small bowel dilatation consistent with obstruction.

## 2019-10-09 IMAGING — DX ABDOMEN - 1 VIEW
1 series · 1 of 1 positions shown · non-contrast
Comparison: 12/30/18

CLINICAL DATA: Abdominal distension

EXAM:
ABDOMEN - 1 VIEW

[abdomen kub]
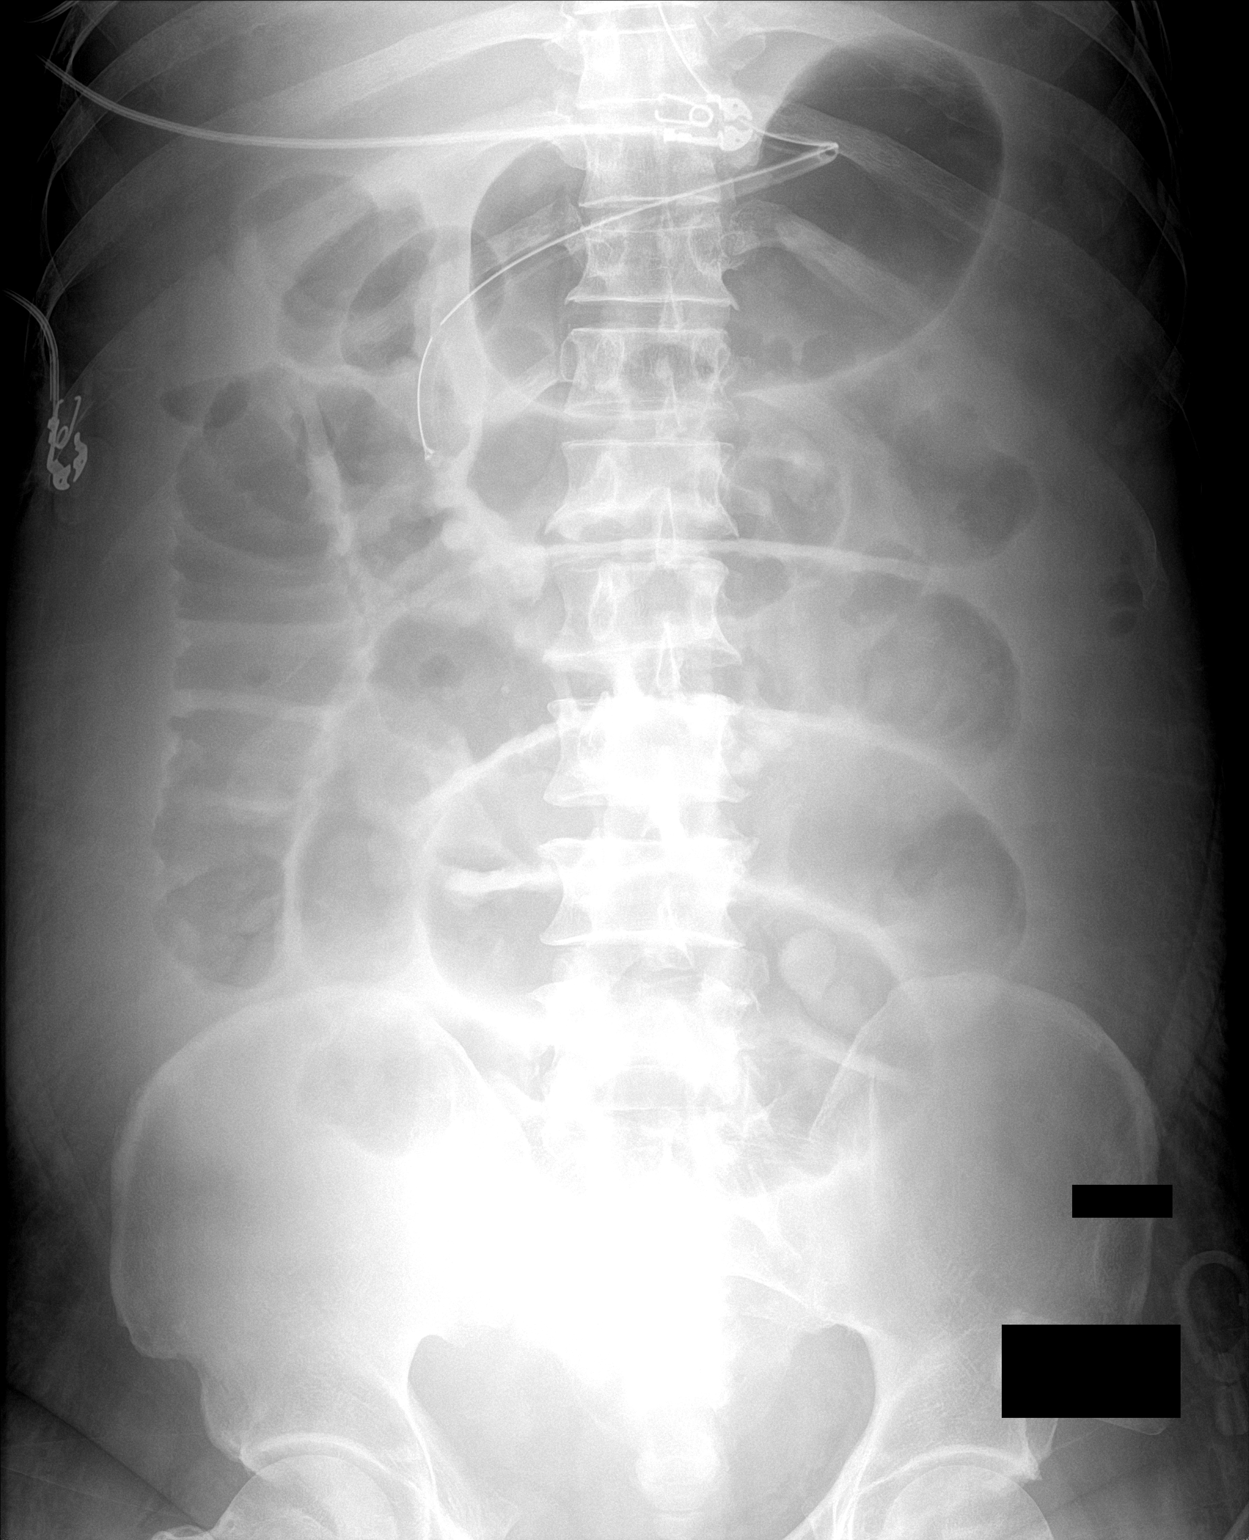

[1 of 1 positions shown; findings below may reference images not displayed]

FINDINGS: Gastric catheter is again seen in the second portion of the
duodenum. Multiple air-filled loops of dilated small bowel are noted
similar to that seen on prior CT examination. Changes are again
consistent with at least partial small bowel obstruction. No free
air is noted.
IMPRESSION: Changes of small-bowel obstruction
